# Patient Record
Sex: Male | Born: 1940 | Race: White | Hispanic: No | Marital: Married | Smoking: Former smoker
Health system: Southern US, Community
[De-identification: ages and names within clinical notes are randomized; demographics above are authoritative.]

## PROBLEM LIST (undated history)

## (undated) DIAGNOSIS — C61 Malignant neoplasm of prostate: Secondary | ICD-10-CM

## (undated) DIAGNOSIS — N529 Male erectile dysfunction, unspecified: Secondary | ICD-10-CM

## (undated) DIAGNOSIS — N433 Hydrocele, unspecified: Secondary | ICD-10-CM

## (undated) DIAGNOSIS — M5136 Other intervertebral disc degeneration, lumbar region: Secondary | ICD-10-CM

## (undated) DIAGNOSIS — Z87438 Personal history of other diseases of male genital organs: Secondary | ICD-10-CM

## (undated) DIAGNOSIS — M51369 Other intervertebral disc degeneration, lumbar region without mention of lumbar back pain or lower extremity pain: Secondary | ICD-10-CM

## (undated) DIAGNOSIS — M5126 Other intervertebral disc displacement, lumbar region: Secondary | ICD-10-CM

## (undated) HISTORY — PX: KNEE ARTHROSCOPY: SUR90

## (undated) HISTORY — DX: Other intervertebral disc degeneration, lumbar region: M51.36

## (undated) HISTORY — DX: Malignant neoplasm of prostate: C61

## (undated) HISTORY — DX: Other intervertebral disc degeneration, lumbar region without mention of lumbar back pain or lower extremity pain: M51.369

## (undated) HISTORY — PX: CATARACT EXTRACTION W/ INTRAOCULAR LENS  IMPLANT, BILATERAL: SHX1307

---

## 1998-12-14 HISTORY — PX: APPENDECTOMY: SHX54

## 1998-12-22 ENCOUNTER — Encounter: Payer: Self-pay | Admitting: Surgery

## 1998-12-22 ENCOUNTER — Inpatient Hospital Stay (HOSPITAL_COMMUNITY): Admission: EM | Admit: 1998-12-22 | Discharge: 1998-12-25 | Payer: Self-pay | Admitting: Surgery

## 1999-07-01 ENCOUNTER — Ambulatory Visit (HOSPITAL_COMMUNITY): Admission: RE | Admit: 1999-07-01 | Discharge: 1999-07-01 | Payer: Self-pay | Admitting: Gastroenterology

## 2004-03-13 ENCOUNTER — Encounter (INDEPENDENT_AMBULATORY_CARE_PROVIDER_SITE_OTHER): Payer: Self-pay | Admitting: Internal Medicine

## 2004-03-13 LAB — CONVERTED CEMR LAB: PSA: 1.2 ng/mL

## 2004-09-26 ENCOUNTER — Ambulatory Visit: Payer: Self-pay | Admitting: Family Medicine

## 2005-02-20 ENCOUNTER — Ambulatory Visit: Payer: Self-pay | Admitting: Family Medicine

## 2005-04-13 ENCOUNTER — Encounter (INDEPENDENT_AMBULATORY_CARE_PROVIDER_SITE_OTHER): Payer: Self-pay | Admitting: Internal Medicine

## 2005-04-13 LAB — CONVERTED CEMR LAB: PSA: 1.44 ng/mL

## 2005-04-18 ENCOUNTER — Ambulatory Visit: Payer: Self-pay | Admitting: Family Medicine

## 2005-05-02 LAB — FECAL OCCULT BLOOD, GUAIAC: Fecal Occult Blood: NEGATIVE

## 2005-05-04 ENCOUNTER — Ambulatory Visit: Payer: Self-pay | Admitting: Family Medicine

## 2005-11-13 DIAGNOSIS — M5126 Other intervertebral disc displacement, lumbar region: Secondary | ICD-10-CM

## 2005-11-13 HISTORY — DX: Other intervertebral disc displacement, lumbar region: M51.26

## 2006-01-31 ENCOUNTER — Ambulatory Visit: Payer: Self-pay | Admitting: Family Medicine

## 2006-03-19 ENCOUNTER — Ambulatory Visit: Payer: Self-pay | Admitting: Family Medicine

## 2006-06-12 ENCOUNTER — Ambulatory Visit: Payer: Self-pay | Admitting: Family Medicine

## 2006-06-18 ENCOUNTER — Encounter: Admission: RE | Admit: 2006-06-18 | Discharge: 2006-06-18 | Payer: Self-pay | Admitting: Orthopedic Surgery

## 2006-06-27 ENCOUNTER — Encounter (INDEPENDENT_AMBULATORY_CARE_PROVIDER_SITE_OTHER): Payer: Self-pay | Admitting: Internal Medicine

## 2006-06-27 ENCOUNTER — Ambulatory Visit: Payer: Self-pay | Admitting: Family Medicine

## 2006-06-27 LAB — CONVERTED CEMR LAB: PSA: 2.11 ng/mL

## 2006-07-28 ENCOUNTER — Ambulatory Visit: Payer: Self-pay | Admitting: Family Medicine

## 2006-08-09 ENCOUNTER — Ambulatory Visit: Payer: Self-pay | Admitting: Family Medicine

## 2007-01-30 ENCOUNTER — Ambulatory Visit: Payer: Self-pay | Admitting: Family Medicine

## 2007-04-18 ENCOUNTER — Telehealth (INDEPENDENT_AMBULATORY_CARE_PROVIDER_SITE_OTHER): Payer: Self-pay | Admitting: *Deleted

## 2007-07-24 ENCOUNTER — Encounter (INDEPENDENT_AMBULATORY_CARE_PROVIDER_SITE_OTHER): Payer: Self-pay | Admitting: Internal Medicine

## 2007-07-24 DIAGNOSIS — R030 Elevated blood-pressure reading, without diagnosis of hypertension: Secondary | ICD-10-CM | POA: Insufficient documentation

## 2007-07-24 DIAGNOSIS — J309 Allergic rhinitis, unspecified: Secondary | ICD-10-CM | POA: Insufficient documentation

## 2007-07-24 DIAGNOSIS — J329 Chronic sinusitis, unspecified: Secondary | ICD-10-CM | POA: Insufficient documentation

## 2007-07-24 DIAGNOSIS — M5137 Other intervertebral disc degeneration, lumbosacral region: Secondary | ICD-10-CM | POA: Insufficient documentation

## 2007-07-24 DIAGNOSIS — R7989 Other specified abnormal findings of blood chemistry: Secondary | ICD-10-CM | POA: Insufficient documentation

## 2007-07-24 DIAGNOSIS — M109 Gout, unspecified: Secondary | ICD-10-CM | POA: Insufficient documentation

## 2007-08-08 ENCOUNTER — Ambulatory Visit: Payer: Self-pay | Admitting: Family Medicine

## 2007-08-08 DIAGNOSIS — H6120 Impacted cerumen, unspecified ear: Secondary | ICD-10-CM | POA: Insufficient documentation

## 2007-09-04 LAB — CONVERTED CEMR LAB
BUN: 18 mg/dL (ref 6–23)
CO2: 28 meq/L (ref 19–32)
Calcium: 9 mg/dL (ref 8.4–10.5)
Chloride: 110 meq/L (ref 96–112)
Cholesterol: 189 mg/dL (ref 0–200)
Creatinine, Ser: 1.2 mg/dL (ref 0.4–1.5)
Folate: 14.9 ng/mL
GFR calc Af Amer: 78 mL/min
GFR calc non Af Amer: 65 mL/min
Glucose, Bld: 89 mg/dL (ref 70–99)
HDL: 34.6 mg/dL — ABNORMAL LOW (ref >39.0)
LDL Cholesterol: 122 mg/dL — ABNORMAL HIGH (ref 0–99)
Mono Screen: NEGATIVE
PSA: 2.26 ng/mL (ref 0.10–4.00)
Potassium: 4.4 meq/L (ref 3.5–5.1)
Sodium: 144 meq/L (ref 135–145)
Total CHOL/HDL Ratio: 5.5
Triglycerides: 163 mg/dL — ABNORMAL HIGH (ref 0–149)
VLDL: 33 mg/dL (ref 0–40)
Vitamin B-12: 408 pg/mL (ref 211–911)

## 2008-11-11 ENCOUNTER — Encounter (INDEPENDENT_AMBULATORY_CARE_PROVIDER_SITE_OTHER): Payer: Self-pay | Admitting: Internal Medicine

## 2008-11-11 ENCOUNTER — Ambulatory Visit: Payer: Self-pay | Admitting: Family Medicine

## 2008-11-25 ENCOUNTER — Ambulatory Visit: Payer: Self-pay | Admitting: Internal Medicine

## 2008-11-27 LAB — CONVERTED CEMR LAB
ALT: 24 units/L (ref 0–53)
AST: 24 units/L (ref 0–37)
Albumin: 3.8 g/dL (ref 3.5–5.2)
Alkaline Phosphatase: 54 units/L (ref 39–117)
BUN: 22 mg/dL (ref 6–23)
Basophils Absolute: 0 10*3/uL (ref 0.0–0.1)
Basophils Relative: 0.3 % (ref 0.0–3.0)
Bilirubin, Direct: 0.1 mg/dL (ref 0.0–0.3)
CO2: 27 meq/L (ref 19–32)
Calcium: 9.2 mg/dL (ref 8.4–10.5)
Chloride: 109 meq/L (ref 96–112)
Cholesterol: 175 mg/dL (ref 0–200)
Creatinine, Ser: 1.2 mg/dL (ref 0.4–1.5)
Eosinophils Absolute: 0.1 10*3/uL (ref 0.0–0.7)
Eosinophils Relative: 1.8 % (ref 0.0–5.0)
GFR calc Af Amer: 78 mL/min
GFR calc non Af Amer: 65 mL/min
Glucose, Bld: 87 mg/dL (ref 70–99)
HCT: 47.5 % (ref 39.0–52.0)
HDL: 32.1 mg/dL — ABNORMAL LOW (ref >39.0)
Hemoglobin: 16.3 g/dL (ref 13.0–17.0)
LDL Cholesterol: 106 mg/dL — ABNORMAL HIGH (ref 0–99)
Lymphocytes Relative: 33.7 % (ref 12.0–46.0)
MCHC: 34.3 g/dL (ref 30.0–36.0)
MCV: 94.9 fL (ref 78.0–100.0)
Monocytes Absolute: 0.6 10*3/uL (ref 0.1–1.0)
Monocytes Relative: 9.5 % (ref 3.0–12.0)
Neutro Abs: 3.1 10*3/uL (ref 1.4–7.7)
Neutrophils Relative %: 54.7 % (ref 43.0–77.0)
PSA: 2.73 ng/mL (ref 0.10–4.00)
Platelets: 172 10*3/uL (ref 150–400)
Potassium: 4.4 meq/L (ref 3.5–5.1)
RBC: 5.01 M/uL (ref 4.22–5.81)
RDW: 12.5 % (ref 11.5–14.6)
Sodium: 141 meq/L (ref 135–145)
TSH: 2.06 microintl units/mL (ref 0.35–5.50)
Total Bilirubin: 0.9 mg/dL (ref 0.3–1.2)
Total CHOL/HDL Ratio: 5.5
Total Protein: 6.7 g/dL (ref 6.0–8.3)
Triglycerides: 182 mg/dL — ABNORMAL HIGH (ref 0–149)
Uric Acid, Serum: 5.9 mg/dL (ref 4.0–7.8)
VLDL: 36 mg/dL (ref 0–40)
WBC: 5.8 10*3/uL (ref 4.5–10.5)

## 2009-01-05 ENCOUNTER — Telehealth (INDEPENDENT_AMBULATORY_CARE_PROVIDER_SITE_OTHER): Payer: Self-pay | Admitting: *Deleted

## 2009-01-12 ENCOUNTER — Encounter (INDEPENDENT_AMBULATORY_CARE_PROVIDER_SITE_OTHER): Payer: Self-pay | Admitting: Internal Medicine

## 2009-03-04 ENCOUNTER — Ambulatory Visit: Payer: Self-pay | Admitting: Gastroenterology

## 2009-03-16 ENCOUNTER — Ambulatory Visit: Payer: Self-pay | Admitting: Gastroenterology

## 2009-03-16 LAB — HM COLONOSCOPY

## 2009-09-13 ENCOUNTER — Encounter (INDEPENDENT_AMBULATORY_CARE_PROVIDER_SITE_OTHER): Payer: Self-pay | Admitting: Internal Medicine

## 2009-09-15 ENCOUNTER — Telehealth (INDEPENDENT_AMBULATORY_CARE_PROVIDER_SITE_OTHER): Payer: Self-pay | Admitting: Internal Medicine

## 2010-05-27 ENCOUNTER — Telehealth: Payer: Self-pay | Admitting: Family Medicine

## 2010-06-14 ENCOUNTER — Ambulatory Visit: Payer: Self-pay | Admitting: Family Medicine

## 2010-06-14 DIAGNOSIS — E785 Hyperlipidemia, unspecified: Secondary | ICD-10-CM | POA: Insufficient documentation

## 2010-06-15 ENCOUNTER — Encounter: Payer: Self-pay | Admitting: Family Medicine

## 2010-06-15 LAB — CONVERTED CEMR LAB
ALT: 23 units/L (ref 0–53)
AST: 26 units/L (ref 0–37)
Albumin: 4 g/dL (ref 3.5–5.2)
Alkaline Phosphatase: 55 units/L (ref 39–117)
BUN: 22 mg/dL (ref 6–23)
Bilirubin, Direct: 0.1 mg/dL (ref 0.0–0.3)
CO2: 31 meq/L (ref 19–32)
Calcium: 9.4 mg/dL (ref 8.4–10.5)
Chloride: 109 meq/L (ref 96–112)
Cholesterol: 188 mg/dL (ref 0–200)
Creatinine, Ser: 1.3 mg/dL (ref 0.4–1.5)
Direct LDL: 106.2 mg/dL
GFR calc non Af Amer: 56.99 mL/min (ref >60)
Glucose, Bld: 86 mg/dL (ref 70–99)
HDL: 34.4 mg/dL — ABNORMAL LOW (ref >39.00)
PSA: 3.18 ng/mL (ref 0.10–4.00)
Potassium: 4.2 meq/L (ref 3.5–5.1)
Sodium: 145 meq/L (ref 135–145)
Total Bilirubin: 0.8 mg/dL (ref 0.3–1.2)
Total CHOL/HDL Ratio: 5
Total Protein: 6.5 g/dL (ref 6.0–8.3)
Triglycerides: 332 mg/dL — ABNORMAL HIGH (ref 0.0–149.0)
Uric Acid, Serum: 5.8 mg/dL (ref 4.0–7.8)
VLDL: 66.4 mg/dL — ABNORMAL HIGH (ref 0.0–40.0)

## 2010-06-23 ENCOUNTER — Telehealth (INDEPENDENT_AMBULATORY_CARE_PROVIDER_SITE_OTHER): Payer: Self-pay | Admitting: *Deleted

## 2010-06-30 ENCOUNTER — Ambulatory Visit: Payer: Self-pay | Admitting: Internal Medicine

## 2010-06-30 LAB — CONVERTED CEMR LAB: Triglycerides: 181 mg/dL — ABNORMAL HIGH (ref 0.0–149.0)

## 2010-08-16 ENCOUNTER — Encounter (INDEPENDENT_AMBULATORY_CARE_PROVIDER_SITE_OTHER): Payer: Self-pay | Admitting: *Deleted

## 2010-12-13 NOTE — Letter (Signed)
Summary: Lipid Letter  [place] [facility]  [address] [place], [place] [address]   Phone: [phone]  Fax: [phone]    06/15/2010  Gerik Coberly [address] [place], [place]  [address]  Dear Mr. Pullin,  We have carefully reviewed your last blood work and lipid profile from 11/25/2008 and the results are noted below with a summary of recommendations for lipid management.    Cholesterol:       188     Goal: < 200   HDL "good" Cholesterol:   73.22     Goal: > 40   LDL "bad" Cholesterol:   106     Goal: < 130   Triglycerides:       332.0     Goal: < 150    You triglycerides came back higher than in the past.  I would encourage you to watch you fatty food intake.  Also, continue your fish oil.  If the triglycerides remain elevated, we may discuss adding another medication to help control your triglycerides.  Your bad cholesterol level was good.  Your sugar, liver and gout check came back all normal.  Your prostate level came back a bit higher than last year, but still in normal range for your age.  I'd recommend repeating in 1 year. Your kidney function was a bit borderline. For this I'd just recommend you stay well hydrated and not use too many anti inflammatories like alleve, ibuprofen, advil or motrin (I'd prefer tylenol).  I would recommend going down to 1/2 a pill of your allopurinol daily as well.    TLC Diet (Therapeutic Lifestyle Change): Saturated Fats & Transfatty acids should be kept < 7% of total calories ***Reduce Saturated Fats Polyunstaurated Fat can be up to 10% of total calories Monounsaturated Fat Fat can be up to 20% of total calories Carbohydrates should be 50-60% of total calories Protein should be approximately 15% of total calories Fiber should be at least 20-30 grams a day ***Increased fiber may help lower LDL Total Cholesterol should be < 200mg /day Consider adding plant stanol/sterols to diet (example: Benacol spread) ***A higher intake  of unsaturated fat may reduce Triglycerides and Increase HDL    Adjunctive Measures (may lower LIPIDS and reduce risk of Heart Attack) include: Aerobic Exercise (20-30 minutes 3-4 times a week) Limit Alcohol Consumption Weight Reduction     Current Medications: 1)    Allopurinol 300 Mg Tabs (Allopurinol) .... Take 1/2 by mouth once a day 2)    Saw Palmetto  .... Take 2 by mouth once a day 3)    Multivitamins   Tabs (Multiple vitamin) .... Take one by mouth once a day 4)    Glucosamine  .... Take one by mouth two times a day 5)    Fish Oil 1200 Mg  Caps (Omega-3 fatty acids) .... Take one by mouth two times a day  If you have any questions, please call. We appreciate being able to work with you.   Sincerely,    [place] [facility] Eustaquio Boyden  MD  Appended Document: Lipid Letter Letter mailed

## 2010-12-13 NOTE — Miscellaneous (Signed)
Summary: flu vaccine at [facility]   Clinical Lists Changes  Observations: Added new observation of FLU VAX: Historical (08/16/2010 16:33)      Immunization History:  Influenza Immunization History:    Influenza:  historical (08/16/2010)  Pt received flu vaccine at [facility] [address] in [place].          Lowella Petties CMA  August 16, 2010 4:34 PM

## 2010-12-13 NOTE — Progress Notes (Signed)
Summary: pt wants to repeat lab work  Phone Note Call from Patient Call back at [facility] [phone]   Caller: Patient Call For: Joshua Boyden  MD Summary of Call: Pt came in the first of the month and had lab work done.  He was not fasting for this and his triglycerides were very high.  He is asking if he can have the labs repeated, after he has fasted.  Please advise. Initial call taken by: Lowella Petties CMA,  June 23, 2010 11:15 AM  Follow-up for Phone Call        yes that is fine.  have him come in fasting and we can check just triglycerides. Follow-up by: Joshua Boyden  MD,  June 23, 2010 11:22 AM  Additional Follow-up for Phone Call Additional follow up Details #1::        Left message on machine for pt to call to schedule lab appt.              Lowella Petties CMA  June 23, 2010 11:54 AM   pt scheduled appt for 06/30/10 Liane Comber CMA (AAMA)  June 29, 2010 1:26 PM

## 2010-12-13 NOTE — Assessment & Plan Note (Signed)
Summary: CPX/MED REFILL/[facility] [name]   Vital Signs:  Joshua Dominguez profile:   70 year old male Height:      77.0 inches Weight:      264 pounds BMI:     31.42 Temp:     97.7 degrees F oral Pulse rate:   60 / minute Pulse rhythm:   regular BP sitting:   118 / 80  (left arm) Cuff size:   large  Vitals Entered By: Selena Batten Dance CMA Duncan Dull) (June 14, 2010 11:24 AM) CC: Cpx   History of Present Illness: CC: presents for CPX, establish  Due for pneumovax.  presents today for CPE.    Does have left foot pain, chronic.  dx with plantar fasciitis, however endorses burning pain along arch and dorsal surface of foot improved by running, worse at night.  No pain when first gets up.  Has been to PT, has tried acupuncture, has tried NSAIDs, ice/heat, heel cushion, orthotics, and metatarsal pad for pain with only limited improvement.  Has seen podiatrists in past.  To see Dr. Fonnie Jarvis (ortho) tomorrow, thinking may need surgery (nerve ablation surgery?).  Runs 3d/wk, lifts weights, tries to stay healthy with diet.  Does not really watch saturated fats or salt intake.  Current Medications (verified): 1)  Allopurinol 300 Mg Tabs (Allopurinol) .... Take One By Mouth Once A Day 2)  Saw Palmetto .... Take 2 By Mouth Once A Day 3)  Multivitamins   Tabs (Multiple Vitamin) .... Take One By Mouth Once A Day 4)  Glucosamine .... Take One By Mouth Two Times A Day 5)  Fish Oil 1200 Mg  Caps (Omega-3 Fatty Acids) .... Take One By Mouth Two Times A Day  Allergies (verified): 1)  ! 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 [place], [place]  Review of Systems  The Joshua Dominguez denies anorexia, fever, weight loss, weight gain, vision loss, chest pain, syncope, dyspnea on exertion, peripheral edema, prolonged cough, hemoptysis, abdominal pain, melena, hematochezia, severe indigestion/heartburn, and hematuria.         + h/o hemorroids  Physical Exam  General:  alert, well-developed, well-nourished, and well-hydrated.   Head:  Normocephalic and atraumatic without obvious abnormalities. No apparent alopecia or balding. Eyes:  No corneal or conjunctival inflammation noted. EOMI. Perrla. Ears:  External ear exam shows no significant lesions or deformities.  Otoscopic examination reveals clear canals, tympanic membranes are intact bilaterally without bulging, retraction, inflammation or discharge. Hearing is grossly normal bilaterally. Mouth:  Oral mucosa and oropharynx without lesions or exudates.  Teeth in good repair. Neck:  No deformities, masses, or tenderness noted. Lungs:  Normal respiratory effort, chest expands symmetrically. Lungs are clear to auscultation, no crackles or wheezes. Heart:  Normal rate and regular rhythm. S1 and S2 normal without gallop, murmur, click, rub or other extra sounds. Abdomen:  Bowel sounds positive,abdomen soft and non-tender without masses, organomegaly or hernias noted. Rectal:  No external abnormalities noted. Normal sphincter tone. No rectal masses or tenderness.  +  small ext hemorrhoids Prostate:  Prostate gland firm and smooth, no enlargement, nodularity, tenderness, mass, asymmetry or induration. Msk:  L foot - no deformity, no erythema, no edema.  + pain with palpation at metatarsal heads of 2nd and 3rd toes, no pain with palpation of heel or  plantar surface.  No longitudinal or transverse arch collapse. Pulses:  2+ periph pulses Extremities:  No clubbing, cyanosis, edema, or deformity noted with normal full range of motion of all joints.     Impression & Recommendations:  Problem # 1:  SPECIAL SCREENING MALIGNANT NEOPLASM OF PROSTATE (ICD-V76.44) DRE reassuring.  check PSA.  if elevated, will need rpt (as today had DRE prior). Orders: TLB-PSA (Prostate Specific Antigen) (84153-PSA)  Problem # 2:  WELL ADULT EXAM (ICD-V70.0) healthy 69yo, discussed lifestyle, diet.  last pneumovax today.  Problem # 3:  DYSLIPIDEMIA (ICD-272.4) recheck FLP.  mild hypertriglyceridemia, low HDL. Orders: TLB-Lipid Panel (80061-LIPID) TLB-Hepatic/Liver Function Pnl (80076-HEPATIC) TLB-BMP (Basic Metabolic Panel-BMET) (80048-METABOL)  Labs Reviewed: SGOT: 24 (11/25/2008)   SGPT: 24 (11/25/2008)   HDL:32.1 (11/25/2008), 34.6 (08/08/2007)  LDL:106 (11/25/2008), 122 (08/08/2007)  Chol:175 (11/25/2008), 189 (08/08/2007)  Trig:182 (11/25/2008), 163 (08/08/2007)  Problem # 4:  GOUT (ICD-274.9)  continue meds.  cehck uric acid level.  allopurinol seems to be controlling well.  stays away from organ meats, but does drink wine daily.   His updated medication list for this problem includes:    Allopurinol 300 Mg Tabs (Allopurinol) .Marland Kitchen... Take one by mouth once a day  Orders: TLB-Uric Acid, Blood (84550-URIC)  His updated medication list for this problem includes:    Allopurinol 300 Mg Tabs (Allopurinol) .Marland Kitchen... Take one by mouth once a day  Complete Medication List: 1)  Allopurinol 300 Mg Tabs (Allopurinol) .... Take one by mouth once a day 2)  Saw Palmetto  .... Take 2 by mouth once a day 3)  Multivitamins Tabs (Multiple vitamin) .... Take one by mouth once a day 4)  Glucosamine  .... Take one by mouth two times a day 5)  Fish Oil 1200 Mg Caps (Omega-3 fatty acids) .... Take one by mouth two times a day  Other Orders: Pneumococcal Vaccine  (84132) Admin 1st Vaccine (44010)  Joshua Dominguez Instructions: 1)  Good to meet you today! 2)  Watch saturated fats. 3)  Phone tree for results, unless abnormal then we will call you. 4)  Please return in 1 year or as needed. Prescriptions: ALLOPURINOL 300 MG TABS (ALLOPURINOL) Take one by mouth once a day  #34 Tablet x 11   Entered and Authorized by:   Eustaquio Boyden  MD   Signed by:   Eustaquio Boyden  MD on 06/14/2010   Method used:   Electronically to        [facility]. [address] [phone]* (retail)       [address] [place], [place]  [id number]       Ph: [phone]       Fax: [phone]   RxID:   [id number]   Current Allergies (reviewed today): ! AFRIN NASAL SPRAY (OXYMETAZOLINE HCL)   Prevention & Chronic Care Immunizations   Influenza vaccine: Not documented   Influenza vaccine due: 07/14/2010    Tetanus booster: 04/18/2005: Td   Tetanus booster due: 04/19/2015    Pneumococcal vaccine: Pneumovax  (06/14/2010)    H. zoster vaccine: Not documented  Colorectal Screening   Hemoccult: Negative  (05/02/2005)   Hemoccult action/deferral: Not indicated  (06/14/2010)  Colonoscopy: Location:  [facility].    (03/16/2009)   Colonoscopy due: 03/2019  Other Screening   PSA: 2.73  (11/25/2008)   PSA ordered.   Smoking status: quit  (07/24/2007)  Lipids   Total Cholesterol: 175  (11/25/2008)   LDL: 106  (11/25/2008)   LDL Direct: Not documented   HDL: 32.1  (11/25/2008)   Triglycerides: 182  (11/25/2008)    SGOT (AST): 24  (11/25/2008)   SGPT (ALT): 24  (11/25/2008)   Alkaline phosphatase: 54  (11/25/2008)   Total bilirubin: 0.9  (11/25/2008)    Lipid flowsheet reviewed?: Yes   Progress toward LDL goal: At goal  Self-Management Support :   Personal Goals (by the next clinic visit) :      Personal LDL goal: 130  (06/14/2010)    Lipid self-management support: Not documented    Nursing Instructions: Give Pneumovax  today    Immunizations Administered:  Pneumonia Vaccine:    Vaccine Type: Pneumovax    Site: left deltoid    Mfr: Merck    Dose: 0.5 ml    Route: IM    Given by: Selena Batten Dance CMA (AAMA)    Exp. Date: 11/30/2011    Lot #: [id number]    VIS given: 06/10/96 version given June 14, 2010.

## 2010-12-13 NOTE — Progress Notes (Signed)
Summary: Allopurinol  Phone Note Refill Request Message from:  [employer] on May 27, 2010 2:42 PM  Refills Requested: Medication #1:  ALLOPURINOL 300 MG TABS Take one by mouth once a day [employer] [address] [place] #[id number]*   Last Lenox Ahr Date:  03/30/2010   Pharmacy Phone:  [phone]   Method Requested: Electronic Initial call taken by: Delilah Shan CMA Joshua Dominguez),  May 27, 2010 2:42 PM  Follow-up for Phone Call        It appears the patient is >1 year from last OV.  Fill rx for 1 month, same sig as before, but have patient scheduled for OV.  Follow-up by: Crawford Givens MD,  May 27, 2010 2:56 PM  Additional Follow-up for Phone Call Additional follow up Details #1::        Patient Advised.  Additional Follow-up by: Delilah Shan CMA (AAMA),  May 27, 2010 3:06 PM    Prescriptions: ALLOPURINOL 300 MG TABS (ALLOPURINOL) Take one by mouth once a day  #34 Tablet x 0   Entered by:   Delilah Shan CMA (AAMA)   Authorized by:   Crawford Givens MD   Signed by:   Delilah Shan CMA (AAMA) on 05/27/2010   Method used:   Electronically to        [facility]. [address] [phone]* (retail)       [address] [place], [place]  [address]       Ph: [phone]       Fax: [phone]   RxID:   [id number]

## 2011-01-09 ENCOUNTER — Other Ambulatory Visit: Payer: Self-pay | Admitting: Family Medicine

## 2011-01-09 ENCOUNTER — Ambulatory Visit (INDEPENDENT_AMBULATORY_CARE_PROVIDER_SITE_OTHER): Payer: BC Managed Care – PPO | Admitting: Family Medicine

## 2011-01-09 ENCOUNTER — Encounter: Payer: Self-pay | Admitting: Family Medicine

## 2011-01-09 DIAGNOSIS — H6120 Impacted cerumen, unspecified ear: Secondary | ICD-10-CM

## 2011-01-09 DIAGNOSIS — M109 Gout, unspecified: Secondary | ICD-10-CM

## 2011-01-10 LAB — BASIC METABOLIC PANEL
BUN: 26 mg/dL — ABNORMAL HIGH (ref 6–23)
CO2: 27 mEq/L (ref 19–32)
Chloride: 107 mEq/L (ref 96–112)
Creatinine, Ser: 1.2 mg/dL (ref 0.4–1.5)
Potassium: 4.1 mEq/L (ref 3.5–5.1)

## 2011-01-10 LAB — URIC ACID: Uric Acid, Serum: 5.5 mg/dL (ref 4.0–7.8)

## 2011-01-19 NOTE — Assessment & Plan Note (Signed)
Summary: EARS FEEL CLOGGED   Vital Signs:  Patient profile:   70 year old male Weight:      264.25 pounds Temp:     98.6 degrees F oral Pulse rate:   74 / minute Pulse rhythm:   regular BP sitting:   114 / 70  (left arm) Cuff size:   large  Vitals Entered By: Selena Batten Dance CMA Duncan Dull) (January 09, 2011 3:06 PM) CC: Ears feel clogged   History of Present Illness: CC: ear clogged  1. L ear for last month with some irritation, thinks impacted.  Has tried at home with warm water, hydrogen peroxide.  unable to resolve it.  + L ear tinnitus chronic, but no hearing changes.  No fevers/chills.  + congestion.  h/o impactions in past.  2. gout - has had 3 separate gout episodes in last 30 days.  was on allopurinol 300mg , doesn't think covering it.  Recent attack R wrist as well as 3rd/4th toes on R foot.  Toe pain not typical of gout - worse at night, has been happening every night.  no problems during day.  ice makes it better.  sheet touching toes causes pain.  thinks it's gout.  carries colchicine with him.  wonders what is causing increase.  No increase in organ meats.  Did have more EtOH prior to last flare (wrist).  going to [place] soon, would like to be gout free there.  Current Medications (verified): 1)  Allopurinol 300 Mg Tabs (Allopurinol) .... Take 1/2 By Mouth Once A Day 2)  Saw Palmetto .... Take 2 By Mouth Once A Day 3)  Multivitamins   Tabs (Multiple Vitamin) .... Take One By Mouth Once A Day 4)  Glucosamine .... Take One By Mouth Two Times A Day 5)  Fish Oil 1200 Mg  Caps (Omega-3 Fatty Acids) .... Take One By Mouth Two Times A Day  Allergies: 1)  ! 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 [place], [place]  Review of Systems       per HPI  Physical Exam  General:  alert, well-developed, well-nourished, and well-hydrated.   Head:  Normocephalic and atraumatic without obvious abnormalities. No apparent alopecia or balding.  Eyes:  No corneal or conjunctival inflammation noted. EOMI. Perrla. Ears:  cerumen impaction blaterally, small piece of TMs seen bilaterally, pearly grey. Nose:  nares clear bilaterally Mouth:  Oral mucosa and oropharynx without lesions or exudates.  Teeth in good repair.  MMM Neck:  No deformities, masses, or tenderness noted. Msk:  R foot - no deformity.  nontender throughout, no pain or erythema at 3rd/4th toes.  some collapse of longitudinal arch. Pulses:  2+ DP/PT on R Extremities:  no pedal edema   Impression & Recommendations:  Problem # 1:  CERUMEN IMPACTION, BILATERAL (ICD-380.4) disimpaction performed.  good result.    Orders: Cerumen Impaction Removal (16109)  Problem # 2:  GOUT (ICD-274.9) recheck UA level as well as Cr to ensure would tolerate 450mg  allopurinol daily.  increase, rec use colchicine as needed.  unsure if toe pain due to gout but wrist flare consistent  with same.  His updated medication list for this problem includes:    Allopurinol 300 Mg Tabs (Allopurinol) .Marland Kitchen... Take 1 1/2 by mouth once a day  Orders: TLB-BMP (Basic Metabolic Panel-BMET) (80048-METABOL) TLB-Uric Acid, Blood (84550-URIC) Venipuncture (16109)  Complete Medication List: 1)  Allopurinol 300 Mg Tabs (Allopurinol) .... Take 1 1/2 by mouth once a day 2)  Saw Palmetto  .... Take 2 by mouth once a day 3)  Multivitamins Tabs (Multiple vitamin) .... Take one by mouth once a day 4)  Glucosamine  .... Take one by mouth two times a day 5)  Fish Oil 1200 Mg Caps (Omega-3 fatty acids)  .... Take one by mouth two times a day  Patient Instructions: 1)  Increase allopurinol to 450mg  daily (1 1/2 pills daily). 2)  blood work today to check uric acid as well as kidneys. 3)  update me with how new dose is doing. 4)  Ears irrigated today. Prescriptions: ALLOPURINOL 300 MG TABS (ALLOPURINOL) Take 1 1/2 by mouth once a day  #45 x 3   Entered and Authorized by:   Eustaquio Boyden  MD   Signed by:   Eustaquio Boyden  MD on 01/09/2011   Method used:   Electronically to        [facility]. [address] [phone]* (retail)       [address] [place], [place]  [id number]       Ph: [phone]       Fax: [phone]   RxID:   [id number]    Orders Added: 1)  Cerumen Impaction Removal [36644] 2)  Est. Patient Level IV [03474] 3)  TLB-BMP (Basic Metabolic Panel-BMET) [80048-METABOL] 4)  TLB-Uric Acid, Blood [84550-URIC] 5)  Venipuncture [25956]    Current Allergies (reviewed today): ! AFRIN NASAL SPRAY (OXYMETAZOLINE HCL)

## 2011-03-22 ENCOUNTER — Telehealth: Payer: Self-pay | Admitting: *Deleted

## 2011-03-22 NOTE — Telephone Encounter (Signed)
Patient is requesting rx for shingles vaccine to be sent to [facility] [address]

## 2011-03-23 MED ORDER — ZOSTER VACCINE LIVE 19400 UNT/0.65ML ~~LOC~~ SOLR: 0.6500 mL | Freq: Once | SUBCUTANEOUS | Status: AC

## 2011-03-23 NOTE — Telephone Encounter (Signed)
Please notify sent in.  To notify us when given so we can input into chart.

## 2011-03-23 NOTE — Telephone Encounter (Signed)
Patient notified and will update [place] when he has the vaccine.

## 2011-03-30 ENCOUNTER — Other Ambulatory Visit: Payer: Self-pay | Admitting: *Deleted

## 2011-03-30 NOTE — Telephone Encounter (Signed)
Opened in error

## 2011-04-27 ENCOUNTER — Encounter: Payer: Self-pay | Admitting: Family Medicine

## 2011-04-28 ENCOUNTER — Ambulatory Visit (INDEPENDENT_AMBULATORY_CARE_PROVIDER_SITE_OTHER): Payer: BC Managed Care – PPO | Admitting: Family Medicine

## 2011-04-28 ENCOUNTER — Encounter: Payer: Self-pay | Admitting: Family Medicine

## 2011-04-28 VITALS — BP 150/90 | HR 70 | Temp 98.5°F | Ht 77.0 in | Wt 265.0 lb

## 2011-04-28 DIAGNOSIS — M79672 Pain in left foot: Secondary | ICD-10-CM

## 2011-04-28 DIAGNOSIS — M542 Cervicalgia: Secondary | ICD-10-CM | POA: Insufficient documentation

## 2011-04-28 DIAGNOSIS — M79609 Pain in unspecified limb: Secondary | ICD-10-CM

## 2011-04-28 MED ORDER — CYCLOBENZAPRINE HCL 10 MG PO TABS: 10.0000 mg | ORAL_TABLET | Freq: Three times a day (TID) | ORAL | Status: AC | PRN

## 2011-04-28 NOTE — Assessment & Plan Note (Signed)
Pt has had 3 different opinions by podiatry and ortho, all think pain consistent with plantar fasciitis, recommend against surgery. Pain has been going on for >1 year.  Have not discussed steroid injections. Doubt we will get good answer. rec return to them, monitor for now with NSAIDs. I do believe neck and foot are 2 separate issues, no evidence of systemic or autoimmune inflammatory condition currently, discussed this with patient.  discussed things to watch out for (fever, chill, rashes, nausea, abd pain, polyarthritis, etc) and to notify us if happens.

## 2011-04-28 NOTE — Progress Notes (Signed)
  Subjective:    Patient ID: Joshua Dominguez, male    DOB: 08/30/1941, 70 y.o.   MRN: [id number]  HPI CC: neck pain  1. 26mo h/o neck pain.  (Started 11/2010).  Feels lots of cracking and popping.  Painful to turn head to right.  R lateral neck, feels it's in joint.  Denies shooting pain down arms, numbness or weakness in arms.  No radiation of pain.  Went to chiropractor - s/p manipulations and not improved.  Denies inciting trauma/injury to start sxs.  Has tried antiinflammatories.  Improved with NSAIDs, then pain returns.  Better with wet heat.  2. Heel pain - dx plantar fasciitis, pt doesn't think this is issue, no pain with first steps in am, worse at night, better with walking, and no heel pain.  Pain described in left arch, sometimes top of foot.  Ice water baths, heel cushion doesn't help.  Heat helps.  Denies inciting injury.  Denies fracture to that foot, has had several ankle sprains in past.  Have done xrays of feet, has seen 2 different podiatrists.  H/o gout, last UA level 5.5 on 300mg  allopurinol daily.  No other joint pains, denies swelling or redness or warmth.  Denies fevers/chills, new rash, nausea/vomiting, abd pain, oral lesions.  Review of Systems Per HPI    Objective:   Physical Exam  Nursing note and vitals reviewed. Constitutional: He is oriented to person, place, and time. He appears well-developed and well-nourished. No distress.  HENT:  Head: Normocephalic and atraumatic.  Mouth/Throat: Oropharynx is clear and moist. No oropharyngeal exudate.  Eyes: Conjunctivae and EOM are normal. Pupils are equal, round, and reactive to light. No scleral icterus.  Musculoskeletal:       Cervical back: Normal. He exhibits normal range of motion.       Right foot: Normal.       Left foot: He exhibits tenderness. He exhibits normal range of motion, no bony tenderness and no swelling.       No midline spine tenderness. Neg spurling. + tender to palpation right mid trapezius as  well as R atlanto occipital joint. L foot - pain along left arch at sole  Neurological: He is alert and oriented to person, place, and time. He has normal strength. No sensory deficit. Coordination and gait normal.  Skin: Skin is warm and dry. No rash noted.          Assessment & Plan:

## 2011-04-28 NOTE — Assessment & Plan Note (Signed)
Exam consistent with likely trapezius muscle strain, ? Scapular dyskinesia as R scapula moves less than left on abduction at shoulders. Treat with muscle relaxant as well as NSAID for next few weeks, stretching exercises. If not better, refer to PT. Forgot to give neck strain handout for stretching exercises, will mail to patient.

## 2011-04-28 NOTE — Patient Instructions (Signed)
For neck - trial of muscle relaxant along with anti inflammatory. Stretching exercises for neck.  If not improving after a few weeks, let me know and we will set you up with physical therapy for neck. Call us with questions.

## 2011-05-16 ENCOUNTER — Telehealth: Payer: Self-pay | Admitting: *Deleted

## 2011-05-16 ENCOUNTER — Other Ambulatory Visit: Payer: Self-pay | Admitting: *Deleted

## 2011-05-16 MED ORDER — ZOSTER VACCINE LIVE 19400 UNT/0.65ML ~~LOC~~ SOLR: 0.6500 mL | Freq: Once | SUBCUTANEOUS | Status: DC

## 2011-05-16 NOTE — Telephone Encounter (Signed)
Opened in error

## 2011-05-16 NOTE — Telephone Encounter (Signed)
Patient called to see if he could have the zostavax canceled with [facility] [address] and recent to [facility] [address] [facility]. Rx sent to [facility].

## 2011-05-16 NOTE — Telephone Encounter (Signed)
Addended by: Melody Comas L on: 05/16/2011 11:35 AM   Modules accepted: Orders

## 2011-05-30 ENCOUNTER — Ambulatory Visit
Admission: RE | Admit: 2011-05-30 | Discharge: 2011-05-30 | Disposition: A | Discharge status: Home / Self Care | Payer: BC Managed Care – PPO | Source: Ambulatory Visit | Attending: Family Medicine | Admitting: Family Medicine

## 2011-05-30 ENCOUNTER — Other Ambulatory Visit: Payer: Self-pay | Admitting: Family Medicine

## 2011-05-30 DIAGNOSIS — M542 Cervicalgia: Secondary | ICD-10-CM

## 2011-09-06 ENCOUNTER — Other Ambulatory Visit: Payer: Self-pay | Admitting: Family Medicine

## 2011-11-10 ENCOUNTER — Other Ambulatory Visit: Payer: Self-pay | Admitting: Family Medicine

## 2011-11-10 DIAGNOSIS — R42 Dizziness and giddiness: Secondary | ICD-10-CM

## 2011-11-10 DIAGNOSIS — R51 Headache: Secondary | ICD-10-CM

## 2011-11-11 ENCOUNTER — Ambulatory Visit
Admission: RE | Admit: 2011-11-11 | Discharge: 2011-11-11 | Disposition: A | Discharge status: Home / Self Care | Payer: BC Managed Care – PPO | Source: Ambulatory Visit | Attending: Family Medicine | Admitting: Family Medicine

## 2011-11-11 DIAGNOSIS — R51 Headache: Secondary | ICD-10-CM

## 2011-11-11 DIAGNOSIS — R42 Dizziness and giddiness: Secondary | ICD-10-CM

## 2012-08-15 ENCOUNTER — Other Ambulatory Visit: Payer: Self-pay | Admitting: Family Medicine

## 2013-02-05 ENCOUNTER — Encounter: Payer: Self-pay | Admitting: Family Medicine

## 2013-02-05 DIAGNOSIS — C61 Malignant neoplasm of prostate: Secondary | ICD-10-CM | POA: Insufficient documentation

## 2013-03-02 ENCOUNTER — Other Ambulatory Visit: Payer: Self-pay | Admitting: Family Medicine

## 2013-03-03 ENCOUNTER — Telehealth: Payer: Self-pay | Admitting: Family Medicine

## 2013-03-03 MED ORDER — ALLOPURINOL 300 MG PO TABS: ORAL_TABLET | ORAL | Status: DC

## 2013-03-03 NOTE — Telephone Encounter (Signed)
ALLOPURINOL 300MG  TAKE 1 AND 1/2 TABLETS BY MOUTH ONCE DAILY AS DIRECTED LAST RF 01/10/2013 #45

## 2013-03-03 NOTE — Telephone Encounter (Signed)
Rx Refilled  

## 2013-03-17 NOTE — Progress Notes (Signed)
Radiation Oncology         [phone] ________________________________  Initial outpatient Consultation  Name: Joshua Dominguez MRN: [id number]  Date: 03/19/2013  DOB: December 08, 1940  JY:NWGNFA Sharen Hones, MD  Garnett Farm, MD   REFERRING PHYSICIAN: Garnett Farm, MD  DIAGNOSIS: 72 y.o. 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 gentleman.  He was noted to have an elevated PSA of 4.53 by his primary care physician, Dr. Tanya Nones.  Accordingly, he was referred for evaluation in urology by Dr. Vernie Ammons,  digital rectal examination was performed at that time revealing a palpable ridge along the right gland.  The patient proceeded to transrectal ultrasound with 12 biopsies of the prostate on 01/30/2013.  The prostate volume measured 36 cc.  Out of 12 core biopsies, 7 were positive.  The maximum Gleason score was 3+3, and this was seen in the areas displayed below.  The patient reviewed the biopsy results with his urologist and he has kindly been referred today for discussion of potential radiation treatment options.  The patient has a for chart he brother who is a urologist in [place] who has reviewed all of his workup and findings to date. Currently, the patient is leaning towards prostate brachytherapy or radical prostatectomy.  PREVIOUS RADIATION THERAPY: No  PAST MEDICAL HISTORY:  has a past medical history of Gout; Allergic rhinitis; Hemorrhoids; DDD (degenerative disc disease), lumbar; Prostate cancer; and Lumbar herniated disc (2007).    PAST SURGICAL HISTORY: Past Surgical History  Procedure Laterality Date  . Knee arthroscopy      Left  . Appendectomy  12/1998  . Lasik  7/07    [facility]  . Transurethral ultrasound guided prostate biopsy  01/30/2013    FAMILY HISTORY: family history includes Gout in his father; Hypertension in his father; Lung cancer in his maternal  grandfather and mother; and Prostate cancer in his father.  SOCIAL HISTORY:  reports that he quit smoking about 36 years ago. He does not have any smokeless tobacco history on file. He reports that  drinks alcohol. He reports that he does not use illicit drugs.  ALLERGIES: Celebrex and Oxymetazoline hcl  MEDICATIONS:  Current Outpatient Prescriptions  Medication Sig Dispense Refill  . allopurinol (ZYLOPRIM) 300 MG tablet take 1 and 1/2 tablet by mouth once daily  45 tablet  3  . gabapentin (NEURONTIN) 300 MG capsule Take 300 mg by mouth 3 (three) times daily. 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 glucosamine-chondroitin 500-400 MG tablet Take 1 tablet by mouth 2 (two) times daily.        . Multiple Vitamin (MULTIVITAMIN) tablet Take 1 tablet by mouth daily.        . Omega-3 Fatty Acids (FISH OIL) 1200 MG CAPS Take 1 capsule by mouth 2 (two) times daily.        . Saw Palmetto, Serenoa repens, (SAW PALMETTO PO) Take 2 capsules by mouth daily.         No current facility-administered medications for this encounter.    REVIEW OF SYSTEMS:  A 15 point review of systems is documented in the electronic medical record. This was obtained by the nursing staff. However, I reviewed this with the patient to discuss relevant findings and make appropriate changes.  A comprehensive review of systems was negative..  The patient completed an IPSS  and IIEF questionnaire.  His IPSS score was 3 indicating mild urinary outflow obstructive symptoms.  He indicated that his erectile function is able to complete sexual activity on most attempts.   PHYSICAL EXAM: This patient is in no acute distress.  He is alert and oriented.   height is 6\' 5"  (1.956 m) and weight is 259 lb 9.6 oz (117.754 kg). His oral temperature is 97 F (36.1 C). His blood pressure is 125/83 and his pulse is 41. His respiration is 16 and oxygen saturation is 100%.  He exhibits no respiratory distress or labored breathing.  He  appears neurologically intact.  His mood is pleasant.  His affect is appropriate.  Please note the digital rectal exam findings described above.  LABORATORY DATA:  Lab Results  Component Value Date   WBC 5.8 11/25/2008   HGB 16.3 11/25/2008   HCT 47.5 11/25/2008   MCV 94.9 11/25/2008   PLT 172 11/25/2008   Lab Results  Component Value Date   NA 141 01/09/2011   K 4.1 01/09/2011   CL 107 01/09/2011   CO2 27 01/09/2011   Lab Results  Component Value Date   ALT 23 06/14/2010   AST 26 06/14/2010   ALKPHOS 55 06/14/2010   BILITOT 0.8 06/14/2010     RADIOGRAPHY: No results found.    IMPRESSION: This gentleman is a  72 y.o. 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  His T-Stage, Gleason's Score, and PSA put him into the favorable risk group.  Accordingly he is eligible for a variety of potential treatment options including active surveillance, radical prostatectomy, external beam radiation, and prostate seed implant.  PLAN:Today I reviewed the findings and workup thus far.  We discussed the natural history of prostate cancer.  We reviewed the the implications of T-stage, Gleason's Score, and PSA on decision-making and outcomes in prostate cancer.  We discussed radiation treatment in the management of prostate cancer with regard to the logistics and delivery of external beam radiation treatment as well as the logistics and delivery of prostate brachytherapy.  We compared and contrasted each of these approaches and also compared these against prostatectomy.  The patient expressed interest in prostate brachytherapy. Usually, we'll a lot a patient counseling form. However, this patient was so well-informed and armed with questions that he guided our discussion covering all the major points related to the treatment options as well as technique details.   The patient would like to proceed with prostate brachytherapy.  I will share my findings with Dr.  Vernie Ammons and move forward with scheduling the procedure in the near future.     The patient does have a planned trip to [place] between May 28 and June 4. Ideally, he would like to undergo his prostate pubic arch study in the next few weeks in order to have his seed implant performed during the first or second week of June.  I enjoyed meeting with him today, and will look forward to participating in the care of this very nice gentleman.   I spent 60 minutes face to face with the patient and more than 50% of that time was spent in counseling and/or coordination of care.   ------------------------------------------------  Artist Pais. Kathrynn Running, M.D.

## 2013-03-18 ENCOUNTER — Encounter: Payer: Self-pay | Admitting: Radiation Oncology

## 2013-03-18 NOTE — Progress Notes (Signed)
GU Location of Tumor / Histology: right lobe of prostate  If Prostate Cancer, Gleason Score is (3 + 3)=6 in 76 cores and PSA is (4.62)  Patient presented March 2010 with signs/symptoms of: prostatitis  Biopsies of prostate (if applicable) revealed: adenocarcinoma cT2a  Past/Anticipated interventions by urology, if any: alpha blocker and antibiotic therapy for prostatitis in 2010 with viagra for erectile dysfunction  Past/Anticipated interventions by medical oncology, if any: None  Weight changes, if ZOX:WRUE noted  Bowel/Bladder complaints, if any: None noted   Nausea/Vomiting, if any: None noted  Pain issues, if any:  Perineal pain   SAFETY ISSUES:  Prior radiation? NO  Pacemaker/ICD? NO  Possible current pregnancy? NO  Is the patient on methotrexate? NO  Current Complaints / other details:  72 year old male. Referred by Dr. Ihor Gully. Interested most in radioactive seed implant.  January 2013 PSA 3.67 January 2014 PSA 4.53 Repeat (unsure of date) PSA 4.62  Father dx with prostate ca at the age of 12 followed by prostatectomy  Prostate volume 36cc  TRUS/BX done 01/2013

## 2013-03-19 ENCOUNTER — Encounter: Payer: Self-pay | Admitting: Radiation Oncology

## 2013-03-19 ENCOUNTER — Ambulatory Visit
Admission: RE | Admit: 2013-03-19 | Discharge: 2013-03-19 | Disposition: A | Discharge status: Home / Self Care | Payer: BC Managed Care – PPO | Source: Ambulatory Visit | Attending: Radiation Oncology | Admitting: Radiation Oncology

## 2013-03-19 ENCOUNTER — Telehealth: Payer: Self-pay | Admitting: *Deleted

## 2013-03-19 VITALS — BP 125/83 | HR 41 | Temp 97.0°F | Resp 16 | Ht 77.0 in | Wt 259.6 lb

## 2013-03-19 DIAGNOSIS — C61 Malignant neoplasm of prostate: Secondary | ICD-10-CM | POA: Insufficient documentation

## 2013-03-19 DIAGNOSIS — G609 Hereditary and idiopathic neuropathy, unspecified: Secondary | ICD-10-CM | POA: Insufficient documentation

## 2013-03-19 DIAGNOSIS — Z79899 Other long term (current) drug therapy: Secondary | ICD-10-CM | POA: Insufficient documentation

## 2013-03-19 HISTORY — DX: Other intervertebral disc displacement, lumbar region: M51.26

## 2013-03-19 NOTE — Assessment & Plan Note (Signed)
Planning for prostate brachytherapy in early June

## 2013-03-19 NOTE — Telephone Encounter (Signed)
Called patient to inform of pre-seed appt. For 03-21-13 at 1:00 pm, spoke with patient and he is aware of this appt.

## 2013-03-19 NOTE — Progress Notes (Signed)
See progress note under physician encounter. 

## 2013-03-19 NOTE — Progress Notes (Signed)
Patient presents to the clinic today accompanied by his wife for consultation with Dr. Kathrynn Running to discuss the role of radiation therapy in the treatment of prostate ca. Patient alert and oriented to person, place, and time. No distress noted. Steady gait noted. Pleasant affect noted. Patient denies perineal pain. Patient reports that he is active and runs three times per week. 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 Patient denies burning with urination. Patient reports hematuria for three days following biopsy but, denies any since then. Patient describes a strong normal urine stream. Patient reports on average he gets up at 0545 to void then, returns to back to sleep going on to say this is the only time he gets up to void. Patient denies diarrhea or pain with bowel movements. Patient reports hemorrhoids causing him to pass bright red blood occasionally.  Patient reports occasional episodes of vertigo and dizziness around allergy season only but, relates this to allergies and inner ear issue. Patient reports these symptoms are relieved with allergy medication. Patient denies nausea, vomiting, headache, diplopia, ringing of the ears, night sweats or unintentional weight loss. Reported all findings to Dr. Kathrynn Running.

## 2013-03-19 NOTE — Progress Notes (Signed)
Complete PATIENT MEASURE OF DISTRESS worksheet with a score of 0 submitted to social work.  

## 2013-03-20 ENCOUNTER — Encounter: Payer: Self-pay | Admitting: Family Medicine

## 2013-03-21 ENCOUNTER — Ambulatory Visit
Admission: RE | Admit: 2013-03-21 | Discharge: 2013-03-21 | Disposition: A | Discharge status: Home / Self Care | Payer: BC Managed Care – PPO | Source: Ambulatory Visit | Attending: Radiation Oncology | Admitting: Radiation Oncology

## 2013-03-21 DIAGNOSIS — C61 Malignant neoplasm of prostate: Secondary | ICD-10-CM

## 2013-03-21 NOTE — Progress Notes (Signed)
  Radiation Oncology         [phone]) [phone] ________________________________  Name: Joshua Dominguez MRN: [id number]  Date: 03/21/2013  DOB: 1941/09/15  SIMULATION AND TREATMENT PLANNING NOTE PUBIC ARCH STUDY  CC:  Garnett Farm, MD  DIAGNOSIS: 72 y.o. 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 He was brought to the radiation planning suite and placed supine on the CT couch. A 3-dimensional image study set was obtained in upload to the planning computer. There, on each axial slice, I contoured the prostate gland. Then, using three-dimensional radiation planning tools I reconstructed the prostate in view of the structures from the transperineal needle pathway to assess for possible pubic arch interference. In doing so, I did not appreciate any pubic arch interference. Also, the patient's prostate volume was estimated based on the drawn structure. The volume was 36 cc.  Given the pubic arch appearance and prostate volume, patient remains a good candidate to proceed with prostate seed implant. Today, he freely provided informed written consent to proceed.    PLAN: The patient will undergo prostate seed implant.   ________________________________  Artist Pais. Kathrynn Running, M.D.

## 2013-03-24 ENCOUNTER — Telehealth: Payer: Self-pay | Admitting: *Deleted

## 2013-03-24 ENCOUNTER — Other Ambulatory Visit: Payer: Self-pay | Admitting: Urology

## 2013-03-24 NOTE — Telephone Encounter (Signed)
CALLED PATIENT TO INFORM OF IMPLANT DATE, SPOKE WITH PATIENT AND HE IS AWARE OF THIS DATE 

## 2013-03-28 ENCOUNTER — Encounter (HOSPITAL_BASED_OUTPATIENT_CLINIC_OR_DEPARTMENT_OTHER): Payer: BC Managed Care – PPO

## 2013-03-28 ENCOUNTER — Other Ambulatory Visit: Payer: Self-pay

## 2013-03-28 ENCOUNTER — Ambulatory Visit (HOSPITAL_BASED_OUTPATIENT_CLINIC_OR_DEPARTMENT_OTHER)
Admission: RE | Admit: 2013-03-28 | Discharge: 2013-03-28 | Disposition: A | Discharge status: Home / Self Care | Payer: BC Managed Care – PPO | Source: Ambulatory Visit | Attending: Urology | Admitting: Urology

## 2013-03-28 DIAGNOSIS — C61 Malignant neoplasm of prostate: Secondary | ICD-10-CM | POA: Insufficient documentation

## 2013-03-28 DIAGNOSIS — Z01810 Encounter for preprocedural cardiovascular examination: Secondary | ICD-10-CM | POA: Insufficient documentation

## 2013-03-28 DIAGNOSIS — Z01818 Encounter for other preprocedural examination: Secondary | ICD-10-CM | POA: Insufficient documentation

## 2013-05-01 ENCOUNTER — Encounter (HOSPITAL_BASED_OUTPATIENT_CLINIC_OR_DEPARTMENT_OTHER): Payer: Self-pay | Admitting: *Deleted

## 2013-05-01 ENCOUNTER — Telehealth: Payer: Self-pay | Admitting: *Deleted

## 2013-05-01 NOTE — Progress Notes (Addendum)
SPOKE W/ PT WIFE. NPO AFTER MN. ARRIVES AT 0600. LABS TO DONE Monday OR TUES. NEXT WEEK. CURRENT EKG AND CXR IN EPIC AND CHART. WILL DO FLEET ENEMA.

## 2013-05-01 NOTE — Telephone Encounter (Signed)
CALLED PATIENT TO ASK QUESTION ,SPOKE WITH PATIENT AND I HAVE AN ANSWER

## 2013-05-08 ENCOUNTER — Telehealth: Payer: Self-pay | Admitting: *Deleted

## 2013-05-08 LAB — CBC
HCT: 44.2 % (ref 39.0–52.0)
Hemoglobin: 15.3 g/dL (ref 13.0–17.0)
MCH: 32.1 pg (ref 26.0–34.0)
MCHC: 34.6 g/dL (ref 30.0–36.0)
MCV: 92.7 fL (ref 78.0–100.0)
Platelets: 164 10*3/uL (ref 150–400)
RBC: 4.77 MIL/uL (ref 4.22–5.81)
RDW: 13 % (ref 11.5–15.5)
WBC: 6.5 10*3/uL (ref 4.0–10.5)

## 2013-05-08 LAB — COMPREHENSIVE METABOLIC PANEL
ALT: 17 U/L (ref 0–53)
AST: 21 U/L (ref 0–37)
Albumin: 3.5 g/dL (ref 3.5–5.2)
Alkaline Phosphatase: 69 U/L (ref 39–117)
BUN: 21 mg/dL (ref 6–23)
CO2: 26 mEq/L (ref 19–32)
Calcium: 9.5 mg/dL (ref 8.4–10.5)
Chloride: 106 mEq/L (ref 96–112)
Creatinine, Ser: 1.14 mg/dL (ref 0.50–1.35)
GFR calc Af Amer: 74 mL/min — ABNORMAL LOW (ref >90)
GFR calc non Af Amer: 64 mL/min — ABNORMAL LOW (ref >90)
Glucose, Bld: 111 mg/dL — ABNORMAL HIGH (ref 70–99)
Potassium: 3.9 mEq/L (ref 3.5–5.1)
Sodium: 141 mEq/L (ref 135–145)
Total Bilirubin: 0.5 mg/dL (ref 0.3–1.2)
Total Protein: 6.8 g/dL (ref 6.0–8.3)

## 2013-05-08 LAB — PROTIME-INR
INR: 0.99 (ref 0.00–1.49)
Prothrombin Time: 12.9 seconds (ref 11.6–15.2)

## 2013-05-08 LAB — APTT: aPTT: 30 seconds (ref 24–37)

## 2013-05-08 NOTE — Progress Notes (Signed)
Spoke with Shirley-pt to have labs drawn today.

## 2013-05-08 NOTE — Telephone Encounter (Signed)
Called patient to remind of procedure for 05-09-13, lvm for a return call

## 2013-05-09 ENCOUNTER — Ambulatory Visit (HOSPITAL_BASED_OUTPATIENT_CLINIC_OR_DEPARTMENT_OTHER)
Admission: RE | Admit: 2013-05-09 | Discharge: 2013-05-09 | Disposition: A | Discharge status: Home / Self Care | Payer: BC Managed Care – PPO | Source: Ambulatory Visit | Attending: Urology | Admitting: Urology

## 2013-05-09 ENCOUNTER — Encounter (HOSPITAL_BASED_OUTPATIENT_CLINIC_OR_DEPARTMENT_OTHER)
Admission: RE | Disposition: A | Discharge status: Home / Self Care | Payer: Self-pay | Source: Ambulatory Visit | Attending: Urology

## 2013-05-09 ENCOUNTER — Ambulatory Visit (HOSPITAL_BASED_OUTPATIENT_CLINIC_OR_DEPARTMENT_OTHER): Payer: BC Managed Care – PPO | Admitting: Anesthesiology

## 2013-05-09 ENCOUNTER — Ambulatory Visit (HOSPITAL_COMMUNITY): Payer: BC Managed Care – PPO

## 2013-05-09 ENCOUNTER — Encounter (HOSPITAL_BASED_OUTPATIENT_CLINIC_OR_DEPARTMENT_OTHER): Payer: Self-pay | Admitting: *Deleted

## 2013-05-09 ENCOUNTER — Encounter (HOSPITAL_BASED_OUTPATIENT_CLINIC_OR_DEPARTMENT_OTHER): Payer: Self-pay | Admitting: Anesthesiology

## 2013-05-09 DIAGNOSIS — C61 Malignant neoplasm of prostate: Secondary | ICD-10-CM | POA: Insufficient documentation

## 2013-05-09 DIAGNOSIS — Z79899 Other long term (current) drug therapy: Secondary | ICD-10-CM | POA: Insufficient documentation

## 2013-05-09 DIAGNOSIS — N529 Male erectile dysfunction, unspecified: Secondary | ICD-10-CM | POA: Insufficient documentation

## 2013-05-09 HISTORY — PX: RADIOACTIVE SEED IMPLANT: SHX5150

## 2013-05-09 HISTORY — DX: Personal history of other diseases of male genital organs: Z87.438

## 2013-05-09 HISTORY — DX: Male erectile dysfunction, unspecified: N52.9

## 2013-05-09 SURGERY — INSERTION, RADIATION SOURCE, PROSTATE
Anesthesia: General | Site: Prostate | Wound class: Clean

## 2013-05-09 MED ORDER — OXYCODONE HCL 5 MG PO TABS
5.0000 mg | ORAL_TABLET | Freq: Once | ORAL | Status: DC | PRN
  Filled 2013-05-09: qty 1

## 2013-05-09 MED ORDER — HYDROCODONE-ACETAMINOPHEN 10-325 MG PO TABS: 1.0000 | ORAL_TABLET | Freq: Four times a day (QID) | ORAL | Status: DC | PRN

## 2013-05-09 MED ORDER — MIDAZOLAM HCL 5 MG/5ML IJ SOLN
INTRAMUSCULAR | Status: DC | PRN
  Administered 2013-05-09: 2 mg via INTRAVENOUS

## 2013-05-09 MED ORDER — IOHEXOL 350 MG/ML SOLN
INTRAVENOUS | Status: DC | PRN
  Administered 2013-05-09: 7 mL

## 2013-05-09 MED ORDER — ACETAMINOPHEN 10 MG/ML IV SOLN
1000.0000 mg | Freq: Once | INTRAVENOUS | Status: DC | PRN
  Filled 2013-05-09: qty 100

## 2013-05-09 MED ORDER — HYDROMORPHONE HCL PF 1 MG/ML IJ SOLN
0.2500 mg | INTRAMUSCULAR | Status: DC | PRN
Start: 2013-05-09 — End: 2013-05-09
  Filled 2013-05-09: qty 1

## 2013-05-09 MED ORDER — CIPROFLOXACIN HCL 500 MG PO TABS: 500.0000 mg | ORAL_TABLET | Freq: Two times a day (BID) | ORAL | Status: DC

## 2013-05-09 MED ORDER — LACTATED RINGERS IV SOLN
INTRAVENOUS | Status: DC
  Administered 2013-05-09 (×2): via INTRAVENOUS
  Filled 2013-05-09: qty 1000

## 2013-05-09 MED ORDER — DEXAMETHASONE SODIUM PHOSPHATE 4 MG/ML IJ SOLN
INTRAMUSCULAR | Status: DC | PRN
  Administered 2013-05-09: 10 mg via INTRAVENOUS

## 2013-05-09 MED ORDER — STERILE WATER FOR IRRIGATION IR SOLN
Status: DC | PRN
  Administered 2013-05-09: 3 mL

## 2013-05-09 MED ORDER — PROMETHAZINE HCL 25 MG/ML IJ SOLN
6.2500 mg | INTRAMUSCULAR | Status: DC | PRN
  Filled 2013-05-09: qty 1

## 2013-05-09 MED ORDER — PROPOFOL 10 MG/ML IV BOLUS
INTRAVENOUS | Status: DC | PRN
  Administered 2013-05-09: 300 mg via INTRAVENOUS

## 2013-05-09 MED ORDER — FLEET ENEMA 7-19 GM/118ML RE ENEM
1.0000 | ENEMA | Freq: Once | RECTAL | Status: DC
  Filled 2013-05-09: qty 1

## 2013-05-09 MED ORDER — CIPROFLOXACIN IN D5W 400 MG/200ML IV SOLN
400.0000 mg | INTRAVENOUS | Status: AC
  Administered 2013-05-09: 400 mg via INTRAVENOUS
  Filled 2013-05-09: qty 200

## 2013-05-09 MED ORDER — OXYCODONE HCL 5 MG/5ML PO SOLN
5.0000 mg | Freq: Once | ORAL | Status: DC | PRN
  Filled 2013-05-09: qty 5

## 2013-05-09 MED ORDER — ONDANSETRON HCL 4 MG/2ML IJ SOLN
INTRAMUSCULAR | Status: DC | PRN
  Administered 2013-05-09: 4 mg via INTRAVENOUS

## 2013-05-09 MED ORDER — MEPERIDINE HCL 25 MG/ML IJ SOLN
6.2500 mg | INTRAMUSCULAR | Status: DC | PRN
  Filled 2013-05-09: qty 1

## 2013-05-09 MED ORDER — FENTANYL CITRATE 0.05 MG/ML IJ SOLN
INTRAMUSCULAR | Status: DC | PRN
  Administered 2013-05-09 (×2): 25 ug via INTRAVENOUS
  Administered 2013-05-09: 50 ug via INTRAVENOUS
  Administered 2013-05-09 (×4): 25 ug via INTRAVENOUS

## 2013-05-09 MED ORDER — STERILE WATER FOR IRRIGATION IR SOLN
Status: DC | PRN
  Administered 2013-05-09: 3000 mL

## 2013-05-09 MED ORDER — HYDROCODONE-ACETAMINOPHEN 10-325 MG PO TABS
1.0000 | ORAL_TABLET | Freq: Four times a day (QID) | ORAL | Status: DC | PRN
  Administered 2013-05-09: 1 via ORAL
  Filled 2013-05-09: qty 1

## 2013-05-09 MED ORDER — LIDOCAINE HCL (CARDIAC) 20 MG/ML IV SOLN
INTRAVENOUS | Status: DC | PRN
  Administered 2013-05-09: 100 mg via INTRAVENOUS

## 2013-05-09 SURGICAL SUPPLY — 29 items
BAG URINE DRAINAGE (UROLOGICAL SUPPLIES) ×2 IMPLANT
BLADE SURG ROTATE 9660 (MISCELLANEOUS) ×2 IMPLANT
CATH COUDE FOLEY 2W 5CC 16FR (CATHETERS) ×2 IMPLANT
CATH FOLEY 2WAY SLVR  5CC 16FR (CATHETERS) ×1
CATH FOLEY 2WAY SLVR 5CC 16FR (CATHETERS) ×1 IMPLANT
CATH ROBINSON RED A/P 20FR (CATHETERS) ×2 IMPLANT
CLOTH BEACON ORANGE TIMEOUT ST (SAFETY) ×2 IMPLANT
COVER MAYO STAND STRL (DRAPES) ×2 IMPLANT
COVER TABLE BACK 60X90 (DRAPES) ×2 IMPLANT
DRSG TEGADERM 4X4.75 (GAUZE/BANDAGES/DRESSINGS) ×2 IMPLANT
DRSG TEGADERM 8X12 (GAUZE/BANDAGES/DRESSINGS) ×2 IMPLANT
GAUZE SPONGE 4X4 12PLY STRL LF (GAUZE/BANDAGES/DRESSINGS) ×2 IMPLANT
GLOVE BIO SURGEON STRL SZ 6.5 (GLOVE) ×2 IMPLANT
GLOVE BIO SURGEON STRL SZ7.5 (GLOVE) ×2 IMPLANT
GLOVE BIO SURGEON STRL SZ8 (GLOVE) ×4 IMPLANT
GLOVE ECLIPSE 8.0 STRL XLNG CF (GLOVE) ×10 IMPLANT
GLOVE INDICATOR 7.0 STRL GRN (GLOVE) ×2 IMPLANT
GOWN STRL NON-REIN LRG LVL3 (GOWN DISPOSABLE) ×2 IMPLANT
GOWN STRL REIN XL XLG (GOWN DISPOSABLE) ×2 IMPLANT
GOWN XL W/COTTON TOWEL STD (GOWNS) IMPLANT
HOLDER FOLEY CATH W/STRAP (MISCELLANEOUS) ×2 IMPLANT
IV NS IRRIG 3000ML ARTHROMATIC (IV SOLUTION) IMPLANT
IV WATER IRR. 1000ML (IV SOLUTION) ×2 IMPLANT
PACK CYSTOSCOPY (CUSTOM PROCEDURE TRAY) ×2 IMPLANT
Radiation seeds ×2 IMPLANT
SYRINGE 10CC LL (SYRINGE) ×2 IMPLANT
UNDERPAD 30X30 INCONTINENT (UNDERPADS AND DIAPERS) ×4 IMPLANT
WATER STERILE IRR 3000ML UROMA (IV SOLUTION) IMPLANT
WATER STERILE IRR 500ML POUR (IV SOLUTION) ×2 IMPLANT

## 2013-05-09 NOTE — H&P (Signed)
History of Present Illness     Joshua Dominguez is a 72 year old male patient who has recently been diagnosed with prostate cancer.  Adenocarcinoma the prostate: His PSA in 1/13 was 3.67 in 1/14 it was noted to be 4.53/11%. He reports that he was told by Dr. Tanya Nones that there was an area that felt abnormal on DRE. He also indicates that a repeat PSA was done and he believes was 4.62. He has, as a risk factor for prostate cancer, father who had prostate cancer diagnosed at the age of 57 and had his prostate removed. On DRE I noted a ridge palpable in the lateral aspect of the prostate on the right-hand side. TRUS/BX 01/30/13 - Prostate volume was 36 cc Pathology: Her adenocarcinoma Gleason score 3+3 = 6 in 7 cores primarily from the right lobe. Stage: cT2a         Prostatitis: He had seen Dr. Logan Bores for this in the distant past. His symptoms consist primarily of perineal pain. I saw him in 3/10 for symptoms consistent with prostatitis and treated him with an alpha-blocker and antibiotic therapy.   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 It is mild in severity. He has used Viagra in the past but does not need it to obtain an erection.  IPSS 12/17/12 - 3/pleased   Results/Data   Partin table results: His probability of organ confined disease is 84% with an 18% probability of extracapsular extension, a 1% probability of seminal vesicle involvement and a 1.4% probability of lymph node involvement. His 5 year progression free probability with brachytherapy is 91% and his 5 and 10 year progression free probability with radical prostatectomy is 97% and 95% respectively.   Past Medical History Problems  1. History of  Gout 274.00  Surgical History Problems  1. History of  Appendectomy  Current Meds 1. Allopurinol TABS; Therapy: (Recorded:02Mar2010) to 2. Glucosamine CAPS; Therapy: (Recorded:02Mar2010) to 3. Levofloxacin 500 MG Oral Tablet; 1 po q day beginning the day prior to  biopsy; Therapy:  12Mar2014 to (Evaluate:15Mar2014)  Requested for: 12Mar2014; Last Rx:12Mar2014  Allergies Medication  1. Afrin Allergy SOLN 2. CeleBREX CAPS  Family History Problems  1. Paternal history of  Death In The Family Father age 52; hiking accident 2. Maternal history of  Death In The Family Mother age 43; lung cancer 3. Family history of  Family Health Status Number Of Children 2 sons 4. Paternal history of  Prostate Cancer V16.42  Social History Problems  1. Alcohol Use 2 a day 2. Caffeine Use 1 a day 3. Marital History - Currently Married 4. Occupation: professor 5. Tobacco Use V15.82 1pk a day for 10 yrs quit 32 yrs ago  Review of Systems Genitourinary, constitutional, skin, eye, otolaryngeal, hematologic/lymphatic, cardiovascular, pulmonary, endocrine, musculoskeletal, gastrointestinal, neurological and psychiatric system(s) were reviewed and pertinent findings if present are noted.  Genitourinary: nocturia and erectile dysfunction.    Vitals Vital Signs BMI Calculated: 28.1 BSA Calculated: 2.41 Height: 6 ft 5 in Weight: 238 lb  Blood Pressure: 129 / 73 Temperature: 97.5 F Heart Rate: 57  Physical Exam Constitutional: Well nourished and well developed . No acute distress.  ENT:. The ears and nose are normal in appearance.  Neck: The appearance of the neck is normal and no neck mass is present.  Pulmonary: No respiratory distress and normal respiratory rhythm and effort.  Cardiovascular: Heart rate and rhythm are normal . No peripheral edema.  Abdomen: The abdomen is soft and nontender. No masses  are palpated. No CVA tenderness. No hernias are palpable. No hepatosplenomegaly noted.  Rectal: Rectal exam demonstrates normal sphincter tone, no tenderness and no masses. Estimated prostate size is 1+. The prostate has a palpable nodule (There is a ridge-shaped raised area that is slightly firmer than the surrounding prostate in the lateral aspect of the  prostate on the right-hand side) involving the right of the prostate which appears to be confined within the prostate capsule and is not tender. The left seminal vesicle is nonpalpable. The right seminal vesicle is nonpalpable. The perineum is normal on inspection.  Genitourinary: Examination of the penis demonstrates no discharge, no masses, no lesions and a normal meatus. The scrotum is without lesions. Examination of the right scrotum demonstrates a hydrocele. Examination of the left scrotum demostrates a hydrocele. The right epididymis is palpably normal and non-tender. The left epididymis is palpably normal and non-tender. The right testis is non-tender and without masses. The left testis is non-tender and without masses.  Lymphatics: The femoral and inguinal nodes are not enlarged or tender.  Skin: Normal skin turgor, no visible rash and no visible skin lesions.  Neuro/Psych:. Mood and affect are appropriate.   Assessment Assessed  1. Adenocarcinoma Of The Prostate Gland 185  Plan The patient was counseled about the natural history of prostate cancer and the standard treatment options that are available for prostate cancer. It was explained to him how his age and life expectancy, clinical stage, Gleason score, and PSA affect his prognosis, the decision to proceed with additional staging studies, as well as how that information influences recommended treatment strategies. We discussed the roles for active surveillance, radiation therapy, surgical therapy, androgen deprivation, as well as ablative therapy options for the treatment of prostate cancer as appropriate to his individual cancer situation. We discussed the risks and benefits of these options with regard to their impact on cancer control and also in terms of potential adverse events, complications, and impact on quiality of life particularly related to urinary, bowel, and sexual function. The patient was encouraged to ask questions throughout  the discussion today and all questions were answered to his stated satisfaction. In addition, the patient was provided with and/or directed to appropriate resources and literature for further education about prostate cancer and treatment options.    We  had a lengthy discussion about his treatment options. He has done extensive amount of research and turned up 10 separate forms of treatment for organ confined prostate cancer. We discussed several of these however the bottom line is he is concerned about quality of life as much as he is about cure. That being said he is quite concerned about cure as well. He was concerned about incontinence primarily. We discussed the risk of this with surgery and the fact that radiation with seeds would likely cause very little risk of whatsoever of this occurring. He does understand there is risk of erectile dysfunction however. We went over the side effects. I told him that I didn't know if external beam radiation therapy would be necessary as he has moderate volume but low risk pathology. It would appear that he could potentially be treated with radioactive seeds alone. He would like to proceed with radioactive seed implantation.

## 2013-05-09 NOTE — Anesthesia Postprocedure Evaluation (Signed)
Anesthesia Post Note  Patient: Joshua Dominguez  Procedure(s) Performed: Procedure(s) (LRB): RADIOACTIVE SEED IMPLANT (N/A)  Anesthesia type: General  Patient location: PACU  Post pain: Pain level controlled  Post assessment: Post-op Vital signs reviewed  Last Vitals: BP 152/90  Pulse 58  Temp(Src) 36.1 C (Oral)  Resp 18  Ht 6\' 4"  (1.93 m)  Wt 256 lb 8 oz (116.348 kg)  BMI 31.24 kg/m2  SpO2 96%  Post vital signs: Reviewed  Level of consciousness: sedated  Complications: No apparent anesthesia complications

## 2013-05-09 NOTE — Op Note (Signed)
PATIENT:  Joshua Dominguez  PRE-OPERATIVE DIAGNOSIS:  Adenocarcinoma of the prostate  POST-OPERATIVE DIAGNOSIS:  Same  PROCEDURE:  Procedure(s): 1. I-125 radioactive seed implantation 2. Cystoscopy  SURGEON:  Surgeon(s): Garnett Farm  Radiation oncologist: Dr. Margaretmary Dys  ANESTHESIA:  General  EBL:  Minimal  DRAINS: 16 French Foley catheter  INDICATION: Joshua Dominguez the patient with biopsy-proven adenocarcinoma of the prostate Gleason score 3+3 = 6. He elected to proceed with radioactive seed implantation.  Description of procedure: After informed consent the patient was brought to the major OR, placed on the table and administered general anesthesia. He was then moved to the modified lithotomy position with his perineum perpendicular to the floor. His perineum and genitalia were then sterilely prepped. An official timeout was then performed. A 16 French Foley catheter was then placed in the bladder and filled with dilute contrast, a rectal tube was placed in the rectum and the transrectal ultrasound probe was placed in the rectum and affixed to the stand. He was then sterilely draped.  Real time ultrasonography was used along with the seed planning software spot-pro version 3.1-00. This was used to develop the seed plan including the number of needles as well as number of seeds required for complete and adequate coverage. Real-time ultrasonography was then used along with the previously developed plan and the Nucletron device to implant a total of 71 seeds using 22 needles. This proceeded without difficulty or complication.  A Foley catheter was then removed as well as the transrectal ultrasound probe and rectal probe. Flexible cystoscopy was then performed using the 17 French flexible scope which revealed a normal urethra throughout its length down to the sphincter which appeared intact. The prostatic urethra revealed bilobar hypertrophy but no evidence of obstruction, seeds,  spacers or lesions. The bladder was then entered and fully and systematically inspected. The ureteral orifices were noted to be of normal configuration and position. The mucosa revealed no evidence of tumors. There were also no stones identified within the bladder. I noted no seeds or spacers on the floor of the bladder and retroflexion of the scope revealed no seeds protruding from the base of the prostate.  The cystoscope was then removed and a new 16 French Foley catheter was then inserted and the balloon was filled with 10 cc of sterile water. This was connected to closed system drainage and the patient was awakened and taken to recovery room in stable and satisfactory condition. He tolerated procedure well and there were no intraoperative complications.

## 2013-05-09 NOTE — Anesthesia Preprocedure Evaluation (Addendum)
Anesthesia Evaluation  Patient identified by MRN, date of birth, ID band Patient awake    Reviewed: Allergy & Precautions, H&P , NPO status , Patient's Chart, lab work & pertinent test results  Airway Mallampati: II TM Distance: >3 FB Neck ROM: Full    Dental  (+) Dental Advisory Given and Teeth Intact   Pulmonary neg pulmonary ROS,  breath sounds clear to auscultation        Cardiovascular negative cardio ROS  Rhythm:Regular Rate:Normal     Neuro/Psych negative neurological ROS  negative psych ROS   GI/Hepatic negative GI ROS, Neg liver ROS,   Endo/Other  negative endocrine ROS  Renal/GU negative Renal ROS     Musculoskeletal negative musculoskeletal ROS (+)   Abdominal   Peds  Hematology negative hematology ROS (+)   Anesthesia Other Findings   Reproductive/Obstetrics                          Anesthesia Physical Anesthesia Plan  ASA: II  Anesthesia Plan: General   Post-op Pain Management:    Induction: Intravenous  Airway Management Planned: LMA  Additional Equipment:   Intra-op Plan:   Post-operative Plan: Extubation in OR  Informed Consent: I have reviewed the patients History and Physical, chart, labs and discussed the procedure including the risks, benefits and alternatives for the proposed anesthesia with the patient or authorized representative who has indicated his/her understanding and acceptance.   Dental advisory given  Plan Discussed with: CRNA  Anesthesia Plan Comments:         Anesthesia Quick Evaluation

## 2013-05-09 NOTE — Anesthesia Procedure Notes (Signed)
Procedure Name: LMA Insertion Performed by: Orchid Glassberg G Pre-anesthesia Checklist: Patient identified, Emergency Drugs available, Suction available and Patient being monitored Patient Re-evaluated:Patient Re-evaluated prior to inductionOxygen Delivery Method: Circle System Utilized Preoxygenation: Pre-oxygenation with 100% oxygen Intubation Type: IV induction Ventilation: Mask ventilation without difficulty LMA: LMA inserted LMA Size: 5.0 Number of attempts: 1 Airway Equipment and Method: bite block Placement Confirmation: positive ETCO2 Tube secured with: Tape Dental Injury: Teeth and Oropharynx as per pre-operative assessment      

## 2013-05-09 NOTE — Transfer of Care (Signed)
Immediate Anesthesia Transfer of Care Note  Patient: Joshua Dominguez  Procedure(s) Performed: Procedure(s) (LRB): RADIOACTIVE SEED IMPLANT (N/A)  Patient Location: PACU  Anesthesia Type: General  Level of Consciousness: awake, alert  and oriented  Airway & Oxygen Therapy: Patient Spontanous Breathing and Patient connected to face mask oxygen  Post-op Assessment: Report given to PACU RN and Post -op Vital signs reviewed and stable  Post vital signs: Reviewed and stable  Complications: No apparent anesthesia complications

## 2013-05-09 NOTE — Procedures (Signed)
  Radiation Oncology         [phone] ________________________________  Name: Joshua Dominguez MRN: [id number]  Date: 03/24/2013  DOB: 04-May-1941       Prostate Seed Implant  WU:[id number],NWGNFA TOM, MD  No ref. provider found  DIAGNOSIS: 72 y.o. 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  RADIATION DOSE: 145 Gy, definitive therapy.  TECHNIQUE: Joshua Dominguez was brought to the operating room with the urologist. He was placed in the dorsolithotomy position. He was catheterized and a rectal tube was inserted. The perineum was shaved, prepped and draped. The ultrasound probe was then introduced into the rectum to see the prostate gland.  TREATMENT DEVICE: A needle grid was attached to the ultrasound probe stand and anchor needles were placed.  3D PLANNING: The prostate was imaged in 3D using a sagittal sweep of the prostate probe. These images were transferred to the planning computer. There, the prostate, urethra and rectum were defined on each axial reconstructed image. Then, the software created an optimized plan and a few seed positions were adjusted. The quality of the plan was evaluated in terms of dose volume histograms and isodose reconstructions.  Then the accepted plan was uploaded to the seed Selectron afterloading unit.  SPECIAL TREATMENT PROCEDURE/SUPERVISION AND HANDLING: The Nucletron FIRST system was used to place the needles under sagittal guidance. A total of 22 needles were used to deposit 71 seeds in the prostate gland. The individual seed activity was 0.459 mCi for a total implant activity of 32.589 mCi.  COMPLEX SIMULATION: At the end of the procedure, an anterior radiograph of the pelvis was obtained to document seed positioning and count. Cystoscopy was performed to check the urethra and bladder.  MICRODOSIMETRY: At the end of the  procedure, the patient was emitting 0.036 mrem/hr at 1 meter. Accordingly, he was considered safe for hospital discharge.  PLAN: The patient will return to the radiation oncology clinic for post implant CT dosimetry in three weeks.   ________________________________  Artist Pais Kathrynn Running, M.D.

## 2013-05-13 ENCOUNTER — Encounter (HOSPITAL_BASED_OUTPATIENT_CLINIC_OR_DEPARTMENT_OTHER): Payer: Self-pay | Admitting: Urology

## 2013-05-28 ENCOUNTER — Telehealth: Payer: Self-pay | Admitting: *Deleted

## 2013-05-28 NOTE — Telephone Encounter (Signed)
CALLED PATIENT TO REMIND OF APPTS. FOR 05-29-13, CONFIRMED APPTS. WITH PATIENT. 

## 2013-05-29 ENCOUNTER — Ambulatory Visit
Admission: RE | Admit: 2013-05-29 | Discharge: 2013-05-29 | Disposition: A | Discharge status: Home / Self Care | Payer: BC Managed Care – PPO | Source: Ambulatory Visit | Attending: Radiation Oncology | Admitting: Radiation Oncology

## 2013-05-29 ENCOUNTER — Encounter: Payer: Self-pay | Admitting: Radiation Oncology

## 2013-05-29 ENCOUNTER — Other Ambulatory Visit: Payer: Self-pay | Admitting: Radiation Oncology

## 2013-05-29 VITALS — BP 130/84 | HR 67 | Temp 98.6°F | Resp 20

## 2013-05-29 DIAGNOSIS — C61 Malignant neoplasm of prostate: Secondary | ICD-10-CM

## 2013-05-29 NOTE — Progress Notes (Signed)
Radiation Oncology         [phone] ________________________________  Name: Joshua Dominguez MRN: [id number]  Date: 05/29/2013  DOB: 07/28/1941  Follow-Up Visit Note  CC: Leo Grosser, MD  Garnett Farm, MD  Diagnosis:   72 y.o. 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  He is complaining of increased urinary frequency and urinary hesitation symptoms. He filled out a questionnaire regarding urinary function today providing and overall IPSS score of 6 characterizing his symptoms as mild.  His pre-implant score was 3. He denies any bowel symptoms.  ALLERGIES:  is allergic to celebrex and oxymetazoline hcl.  Meds: Current Outpatient Prescriptions  Medication Sig Dispense Refill  . allopurinol (ZYLOPRIM) 300 MG tablet Take 300 mg by mouth daily. take 1 and 1/2 tablet by mouth once daily      . gabapentin (NEURONTIN) 300 MG capsule Take 300 mg by mouth 3 (three) times daily. 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 glucosamine-chondroitin 500-400 MG tablet Take 1 tablet by mouth 2 (two) times daily.       Marland Kitchen HYDROcodone-acetaminophen (NORCO) 10-325 MG per tablet Take 1 tablet by mouth every 6 (six) hours as needed for pain.  12 tablet  0  . ibuprofen (ADVIL,MOTRIN) 200 MG tablet Take 200 mg by mouth every 6 (six) hours as needed for pain.      . Multiple Vitamin (MULTIVITAMIN) tablet Take 1 tablet by mouth daily.       . Omega-3 Fatty Acids (FISH OIL) 1200 MG CAPS Take 1 capsule by mouth 2 (two) times daily.       . Saw Palmetto, Serenoa repens, (SAW PALMETTO PO) Take 2 capsules by mouth daily.        No current facility-administered medications for this encounter.    Physical Findings: The patient is in no acute distress. Patient is alert and oriented.  oral temperature is 98.6 F (37 C).  His blood pressure is 130/84 and his pulse is 67. His respiration is 20. Marland Kitchen  No significant changes.  Lab Findings: Lab Results  Component Value Date   WBC 6.5 05/08/2013   HGB 15.3 05/08/2013   HCT 44.2 05/08/2013   MCV 92.7 05/08/2013   PLT 164 05/08/2013    Radiographic Findings:  Patient underwent CT imaging in our clinic for post implant dosimetry. The CT appears to demonstrate an adequate distribution of radioactive seeds throughout the prostate gland. There no seeds in her near the rectum. I suspect the final radiation plan and dosimetry will show appropriate coverage of the prostate gland.   Impression: The patient is recovering from the effects of radiation. His urinary symptoms should gradually improve over the next 4-6 months. We talked about this today. He is encouraged by his improvement already and is otherwise please with his outcome.   Plan: Today, I spent time talking to the patient about his prostate seed implant and resolving urinary symptoms. We also talked about long-term follow-up for prostate cancer following seed implant. He understands that ongoing PSA determinations and digital rectal exams will help perform surveillance to rule out disease recurrence. He understands what to expect with his PSA measures. Patient was also educated today about some of the long-term effects would radiation including the Small risk for rectal bleeding and  possibly erectile dysfunction. Talked about some of the general management approaches to these potential complications. However, I did encourage the patient to contact her office or return at any point if he has questions or concerns related to his previous radiation and prostate cancer.   _____________________________________  Artist Pais. Kathrynn Running, M.D.

## 2013-05-29 NOTE — Progress Notes (Signed)
Pt states his post op issues of dysuria, hematuria w/blood clots and constipation w/bladder, bowel have resolved since his implant.  He continues to have some urinary urgency, need to strain to initiate urination at times, nocturia x 1. IPSS 6 today.

## 2013-05-29 NOTE — Progress Notes (Signed)
  Radiation Oncology         [phone] ________________________________  Name: Joshua Dominguez MRN: [id number]  Date: 05/29/2013  DOB: Feb 22, 1941  COMPLEX SIMULATION NOTE  NARRATIVE:  The patient was brought to the CT Simulation planning suite today following prostate seed implantation approximately one month ago.  Identity was confirmed.  All relevant records and images related to the planned course of therapy were reviewed.  Then, the patient was set-up supine.  CT images were obtained.  The CT images were loaded into the planning software.  Then the prostate and rectum were contoured.  Treatment planning then occurred.  The implanted iodine 125 seeds were identified by the physics staff for projection of radiation distribution  I have requested : 3D Simulation  I have requested a DVH of the following structures: Prostate and rectum.    ________________________________  Artist Pais Kathrynn Running, M.D.

## 2013-06-09 ENCOUNTER — Encounter: Payer: Self-pay | Admitting: Radiation Oncology

## 2013-06-10 NOTE — Progress Notes (Signed)
  Radiation Oncology         [phone] ________________________________  Name: DEMITRUS FRANCISCO MRN: [id number]  Date: 06/09/2013  DOB: 1940/12/07  3-D Planning Note Prostate Brachytherapy  Diagnosis: 72 y.o. 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 He underwent CT scan complex simulation to delineate the three-dimensional structures of the pelvis and demonstrate the radiation distribution.  Since that time, the seed localization, and 3D planning with dose volume histograms have now been completed.  Results:   Prostate Coverage - The dose of radiation delivered to the 90% or more of the prostate gland (D90) was in a 99.86 % of the prescription dose. This exceeds our goal of greater than 90%. Rectal Sparing - The volume of rectal tissue receiving the prescription dose or higher was 0.0 cc. This falls under our thresholds tolerance of 1.0 cc.  Impression: The prostate seed implant appears to show adequate target coverage and appropriate rectal sparing.  Plan:  The patient will continue to follow with urology for ongoing PSA determinations. I would anticipate a high likelihood for local tumor control with minimal risk for rectal morbidity.  ________________________________  Artist Pais Kathrynn Running, M.D.

## 2013-07-15 ENCOUNTER — Ambulatory Visit (INDEPENDENT_AMBULATORY_CARE_PROVIDER_SITE_OTHER): Payer: BC Managed Care – PPO | Admitting: Family Medicine

## 2013-07-15 ENCOUNTER — Encounter: Payer: Self-pay | Admitting: Family Medicine

## 2013-07-15 VITALS — BP 118/74 | HR 60 | Temp 98.1°F | Resp 16 | Ht 77.0 in | Wt 267.0 lb

## 2013-07-15 DIAGNOSIS — M109 Gout, unspecified: Secondary | ICD-10-CM

## 2013-07-15 LAB — BASIC METABOLIC PANEL
BUN: 22 mg/dL (ref 6–23)
Chloride: 107 mEq/L (ref 96–112)
Glucose, Bld: 86 mg/dL (ref 70–99)
Potassium: 4.6 mEq/L (ref 3.5–5.3)
Sodium: 141 mEq/L (ref 135–145)

## 2013-07-15 LAB — URIC ACID: Uric Acid, Serum: 5.4 mg/dL (ref 4.0–7.8)

## 2013-07-15 MED ORDER — PREDNISONE 20 MG PO TABS: ORAL_TABLET | ORAL | Status: DC

## 2013-07-15 MED ORDER — COLCHICINE 0.6 MG PO TABS: 0.6000 mg | ORAL_TABLET | Freq: Every day | ORAL | Status: DC

## 2013-07-15 NOTE — Progress Notes (Signed)
Subjective:    Patient ID: Joshua Dominguez, male    DOB: Jun 01, 1941, 72 y.o.   MRN: [id number]  HPI Patient is a very pleasant 72 year old white male with a history of gout. He is currently taking allopurinol 300 mg a day for prophylaxis. He is at 2 separate gout attacks this summer. 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  The joint itself is tender to the touch and slightly erythematous. It also has decreased range of motion.  Otherwise he is doing well. Past Medical History  Diagnosis Date  . Gout   . Allergic rhinitis   . Hemorrhoids   . DDD (degenerative disc disease), lumbar      L3-L4  . Lumbar herniated disc 2007    L4 and L5 did not require surgery  . Prostate cancer   . History of prostatitis   . ED (erectile dysfunction) of organic origin    Past Surgical History  Procedure Laterality Date  . Knee arthroscopy Left   . Appendectomy  FEB 2000  . Cataract extraction w/ intraocular lens  implant, bilateral    . Radioactive seed implant N/A 05/09/2013    Procedure: RADIOACTIVE SEED IMPLANT;  Surgeon: Garnett Farm, MD;  Location: [facility];  Service: Urology;  Laterality: N/A;   Current Outpatient Prescriptions on File Prior to Visit  Medication Sig Dispense Refill  . allopurinol (ZYLOPRIM) 300 MG tablet Take 300 mg by mouth daily. take 1 and 1/2 tablet by mouth once daily      . gabapentin (NEURONTIN) 300 MG capsule Take 300 mg by mouth 3 (three) times daily. 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 glucosamine-chondroitin 500-400 MG tablet Take 1 tablet by mouth 2 (two) times daily.       Marland Kitchen ibuprofen (ADVIL,MOTRIN) 200 MG tablet Take 200 mg by mouth every 6 (six) hours as needed for pain.      . Multiple Vitamin (MULTIVITAMIN) tablet Take 1 tablet by mouth daily.       . Omega-3 Fatty Acids (FISH OIL) 1200 MG CAPS Take 1 capsule by mouth 2  (two) times daily.       . Saw Palmetto, Serenoa repens, (SAW PALMETTO PO) Take 2 capsules by mouth daily.       Marland Kitchen HYDROcodone-acetaminophen (NORCO) 10-325 MG per tablet Take 1 tablet by mouth every 6 (six) hours as needed for pain.  12 tablet  0   No current facility-administered medications on file prior to visit.   Allergies  Allergen Reactions  . Celebrex [Celecoxib] Rash  . Oxymetazoline Hcl Rash   History   Social History  . Marital Status: Married    Spouse Name: N/A    Number of Children: N/A  . Years of Education: N/A   Occupational History  . Consulting/professor    Social History Main Topics  . Smoking status: Former Smoker    Quit date: 11/13/1976  . Smokeless tobacco: Never Used  . Alcohol Use: 8.4 oz/week    14 Glasses of wine per week  . Drug Use: No  . Sexual Activity: Not on file   Other Topics Concern  . 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       Objective:   Physical Exam  Vitals reviewed. Cardiovascular: Normal rate, regular rhythm and normal heart sounds.   No murmur heard. Pulmonary/Chest: Effort normal and breath sounds normal. No respiratory distress. He has no wheezes. He has no rales.   on the right foot the PIP joint of the fourth toe is erythematous swollen and tender to the touch        Assessment & Plan:  1. Gout attack Start prednisone taper pack to calm down  gout flare. Once the patient is asymptomatic for 2 weeks, I would switch allopurinol to uloric 80 mg by mouth daily hopefully to prevent gout flares more effectively.  When he starts the medication, he will need to add colchicine 0.6 mg by mouth daily as a  preventative for the first in 4 weeks. - predniSONE (DELTASONE) 20 MG tablet; 3 tabs poqday 1-2, 2 tabs poqday 3-4, 1 tabs poqday 5-6  Dispense: 12 tablet; Refill: 0 - Basic Metabolic Panel - Uric Acid - colchicine 0.6 MG tablet; Take 1 tablet (0.6 mg total) by mouth daily.  Dispense: 30 tablet; Refill: 0

## 2013-07-21 ENCOUNTER — Telehealth: Payer: Self-pay | Admitting: Family Medicine

## 2013-07-21 MED ORDER — FEBUXOSTAT 80 MG PO TABS: 80.0000 mg | ORAL_TABLET | Freq: Every day | ORAL | Status: DC

## 2013-07-21 NOTE — Telephone Encounter (Signed)
Pt said that you were going to change him from Allopurinol to something else. He is about out of his current medication. Do you want him on something else or stay on the same thing?

## 2013-07-21 NOTE — Telephone Encounter (Signed)
Med sent to pharm 

## 2013-07-21 NOTE — Telephone Encounter (Signed)
Patient aware.

## 2013-07-21 NOTE — Telephone Encounter (Signed)
Send uloric to pharm, but do not start it until asymptomatic for 2 weeks.

## 2013-08-31 ENCOUNTER — Telehealth: Payer: Self-pay | Admitting: Family Medicine

## 2013-08-31 DIAGNOSIS — M109 Gout, unspecified: Secondary | ICD-10-CM

## 2013-08-31 MED ORDER — COLCHICINE 0.6 MG PO TABS: 0.6000 mg | ORAL_TABLET | Freq: Every day | ORAL | Status: DC

## 2013-08-31 NOTE — Telephone Encounter (Signed)
Colcrys 0.6 mg

## 2013-10-02 ENCOUNTER — Encounter: Payer: Self-pay | Admitting: *Deleted

## 2013-12-10 ENCOUNTER — Other Ambulatory Visit: Payer: BC Managed Care – PPO

## 2013-12-10 DIAGNOSIS — Z0000 Encounter for general adult medical examination without abnormal findings: Secondary | ICD-10-CM

## 2013-12-10 LAB — LIPID PANEL
CHOL/HDL RATIO: 5.4 ratio
Cholesterol: 188 mg/dL (ref 0–200)
HDL: 35 mg/dL — ABNORMAL LOW (ref >39)
LDL CALC: 115 mg/dL — AB (ref 0–99)
Triglycerides: 190 mg/dL — ABNORMAL HIGH (ref <150)
VLDL: 38 mg/dL (ref 0–40)

## 2013-12-10 LAB — CBC WITH DIFFERENTIAL/PLATELET
BASOS ABS: 0 10*3/uL (ref 0.0–0.1)
BASOS PCT: 1 % (ref 0–1)
EOS ABS: 0.1 10*3/uL (ref 0.0–0.7)
Eosinophils Relative: 2 % (ref 0–5)
HCT: 45 % (ref 39.0–52.0)
Hemoglobin: 15.6 g/dL (ref 13.0–17.0)
Lymphocytes Relative: 37 % (ref 12–46)
Lymphs Abs: 1.9 10*3/uL (ref 0.7–4.0)
MCH: 31.5 pg (ref 26.0–34.0)
MCHC: 34.7 g/dL (ref 30.0–36.0)
MCV: 90.9 fL (ref 78.0–100.0)
Monocytes Absolute: 0.5 10*3/uL (ref 0.1–1.0)
Monocytes Relative: 10 % (ref 3–12)
NEUTROS PCT: 50 % (ref 43–77)
Neutro Abs: 2.6 10*3/uL (ref 1.7–7.7)
PLATELETS: 180 10*3/uL (ref 150–400)
RBC: 4.95 MIL/uL (ref 4.22–5.81)
RDW: 13.8 % (ref 11.5–15.5)
WBC: 5.2 10*3/uL (ref 4.0–10.5)

## 2013-12-10 LAB — COMPREHENSIVE METABOLIC PANEL
ALK PHOS: 57 U/L (ref 39–117)
ALT: 22 U/L (ref 0–53)
AST: 26 U/L (ref 0–37)
Albumin: 3.9 g/dL (ref 3.5–5.2)
BILIRUBIN TOTAL: 0.6 mg/dL (ref 0.2–1.2)
BUN: 23 mg/dL (ref 6–23)
CO2: 28 mEq/L (ref 19–32)
Calcium: 9 mg/dL (ref 8.4–10.5)
Chloride: 105 mEq/L (ref 96–112)
Creat: 1.2 mg/dL (ref 0.50–1.35)
Glucose, Bld: 85 mg/dL (ref 70–99)
Potassium: 4.2 mEq/L (ref 3.5–5.3)
SODIUM: 140 meq/L (ref 135–145)
TOTAL PROTEIN: 6.4 g/dL (ref 6.0–8.3)

## 2013-12-10 LAB — PSA: PSA: 0.91 ng/mL (ref <=4.00)

## 2013-12-11 ENCOUNTER — Ambulatory Visit (INDEPENDENT_AMBULATORY_CARE_PROVIDER_SITE_OTHER): Payer: BC Managed Care – PPO | Admitting: Family Medicine

## 2013-12-11 ENCOUNTER — Other Ambulatory Visit: Payer: Self-pay | Admitting: Urology

## 2013-12-11 ENCOUNTER — Encounter: Payer: Self-pay | Admitting: Family Medicine

## 2013-12-11 VITALS — BP 110/64 | HR 64 | Temp 97.2°F | Resp 16 | Ht 77.0 in | Wt 272.0 lb

## 2013-12-11 DIAGNOSIS — Z0000 Encounter for general adult medical examination without abnormal findings: Secondary | ICD-10-CM

## 2013-12-11 DIAGNOSIS — Z23 Encounter for immunization: Secondary | ICD-10-CM

## 2013-12-11 NOTE — Progress Notes (Signed)
Subjective:    Patient ID: Joshua Dominguez, male    DOB: [birth year], 73 y.o.   MRN: [id number]  HPI Patient is here today for complete physical exam. In June of 2014 the patient underwent radioactive seed implantation for his prostate adenocarcinoma.  His last office note for Dr. Tammi Klippel is copied below:  Diagnosis: 73 y.o. 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 He underwent CT scan complex simulation to delineate the three-dimensional structures of the pelvis and demonstrate the radiation distribution. Since that time, the seed localization, and 3D planning with dose volume histograms have now been completed.  Results:  Prostate Coverage - The dose of radiation delivered to the 90% or more of the prostate gland (D90) was in a 99.86 % of the prescription dose. This exceeds our goal of greater than 90%.  Rectal Sparing - The volume of rectal tissue receiving the prescription dose or higher was 0.0 cc. This falls under our thresholds tolerance of 1.0 cc.  Impression: The prostate seed implant appears to show adequate target coverage and appropriate rectal sparing.  Plan: The patient will continue to follow with urology for ongoing PSA determinations. I would anticipate a high likelihood for local tumor control with minimal risk for rectal morbidity.  Since that time his PSA has fallen from 4.53 to 0.91.  Patient is doing extremely well. He has no further concerns. His last colonoscopy was in 2010. He has had Zostavax. He has had Pneumovax 23. He has had his flu shot.  He is due for Prevnar  Lab on 12/10/2013  Component Date Value Range Status  . WBC 12/10/2013 5.2  4.0 - 10.5 K/uL Final  . RBC 12/10/2013 4.95  4.22 - 5.81 MIL/uL Final  . Hemoglobin 12/10/2013 15.6  13.0 - 17.0 g/dL Final  . HCT 12/10/2013 45.0   39.0 - 52.0 % Final  . MCV 12/10/2013 90.9  78.0 - 100.0 fL Final  . MCH 12/10/2013 31.5  26.0 - 34.0 pg Final  . MCHC 12/10/2013 34.7  30.0 - 36.0 g/dL Final  . RDW 12/10/2013 13.8  11.5 - 15.5 % Final  . Platelets 12/10/2013 180  150 - 400 K/uL Final  . Neutrophils Relative % 12/10/2013 50  43 - 77 % Final  . Neutro Abs 12/10/2013 2.6  1.7 - 7.7 K/uL Final  . Lymphocytes Relative 12/10/2013 37  12 - 46 % Final  . Lymphs Abs 12/10/2013 1.9  0.7 - 4.0 K/uL Final  . Monocytes Relative 12/10/2013 10  3 - 12 % Final  . Monocytes Absolute 12/10/2013 0.5  0.1 - 1.0 K/uL Final  . Eosinophils Relative 12/10/2013 2  0 - 5 % Final  . Eosinophils Absolute 12/10/2013 0.1  0.0 - 0.7 K/uL Final  . Basophils Relative 12/10/2013 1  0 - 1 % Final  . Basophils Absolute 12/10/2013 0.0  0.0 - 0.1 K/uL Final  . Smear Review 12/10/2013 Criteria for review not met   Final  . Sodium 12/10/2013 140  135 - 145 mEq/L Final  . Potassium 12/10/2013 4.2  3.5 - 5.3 mEq/L Final  . Chloride 12/10/2013 105  96 - 112 mEq/L Final  . CO2 12/10/2013 28  19 - 32 mEq/L Final  . Glucose, Bld 12/10/2013 85  70 - 99 mg/dL Final  . BUN 12/10/2013 23  6 - 23 mg/dL Final  . Creat 12/10/2013 1.20  0.50 - 1.35 mg/dL Final  . Total Bilirubin 12/10/2013 0.6  0.2 - 1.2 mg/dL Final   ** Please note change in reference range(s). **  . Alkaline Phosphatase 12/10/2013 57  39 - 117 U/L Final  . AST 12/10/2013 26  0 - 37 U/L Final  . ALT 12/10/2013 22  0 - 53 U/L Final  . Total Protein 12/10/2013 6.4  6.0 - 8.3 g/dL Final  . Albumin 12/10/2013 3.9  3.5 - 5.2 g/dL Final  . Calcium 12/10/2013 9.0  8.4 - 10.5 mg/dL Final  . Cholesterol 12/10/2013 188  0 - 200 mg/dL Final   Comment: ATP III Classification:                                < 200        mg/dL        Desirable                               200 - 239     mg/dL        Borderline High                               >= 240        mg/dL        High                             .  Triglycerides 12/10/2013 190* <150 mg/dL Final  . HDL 12/10/2013 35* >39 mg/dL Final  . Total CHOL/HDL Ratio 12/10/2013 5.4   Final  . VLDL 12/10/2013 38  0 - 40 mg/dL Final  . LDL Cholesterol 12/10/2013 115* 0 - 99 mg/dL Final   Comment:                            Total Cholesterol/HDL Ratio:CHD Risk                                                 Coronary Heart Disease Risk Table                                                                 Men       Women                                   1/2 Average Risk              3.4        3.3                                       Average  Risk              5.0        4.4                                    2X Average Risk              9.6        7.1                                    3X Average Risk             23.4       11.0                          Use the calculated Patient Ratio above and the CHD Risk table                           to determine the patient's CHD Risk.                          ATP III Classification (LDL):                                < 100        mg/dL         Optimal                               100 - 129     mg/dL         Near or Above Optimal                               130 - 159     mg/dL         Borderline High                               160 - 189     mg/dL         High                                > 190        mg/dL         Very High                             . PSA 12/10/2013 0.91  <=4.00 ng/mL Final   Comment: Test Methodology: ECLIA PSA (Electrochemiluminescence Immunoassay)                                                     For PSA values from 2.5-4.0, particularly in younger men <60 years  old, the AUA and NCCN suggest testing for % Free PSA (3515) and                          evaluation of the rate of increase in PSA (PSA velocity).   Past Medical History  Diagnosis Date  . Gout   . Allergic rhinitis   . Hemorrhoids   . DDD (degenerative disc disease), lumbar       L3-L4  . Lumbar herniated disc 2007    L4 and L5 did not require surgery  . Prostate cancer   . History of prostatitis   . ED (erectile dysfunction) of organic origin    Past Surgical History  Procedure Laterality Date  . Knee arthroscopy Left   . Appendectomy  FEB 2000  . Cataract extraction w/ intraocular lens  implant, bilateral    . Radioactive seed implant N/A 05/09/2013    Procedure: RADIOACTIVE SEED IMPLANT;  Surgeon: Claybon Jabs, MD;  Location: [facility];  Service: Urology;  Laterality: N/A;   Current Outpatient Prescriptions on File Prior to Visit  Medication Sig Dispense Refill  . colchicine 0.6 MG tablet Take 1 tablet (0.6 mg total) by mouth daily.  30 tablet  5  . Febuxostat (ULORIC) 80 MG TABS Take 1 tablet (80 mg total) by mouth daily.  30 tablet  5  . 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 glucosamine-chondroitin 500-400 MG tablet Take 1 tablet by mouth 2 (two) times daily.       Marland Kitchen ibuprofen (ADVIL,MOTRIN) 200 MG tablet Take 200 mg by mouth every 6 (six) hours as needed for pain.      . Multiple Vitamin (MULTIVITAMIN) tablet Take 1 tablet by mouth daily.       . Omega-3 Fatty Acids (FISH OIL) 1200 MG CAPS Take 1 capsule by mouth 2 (two) times daily.       . Saw Palmetto, Serenoa repens, (SAW PALMETTO PO) Take 2 capsules by mouth daily.       Marland Kitchen HYDROcodone-acetaminophen (NORCO) 10-325 MG per tablet Take 1 tablet by mouth every 6 (six) hours as needed for pain.  12 tablet  0   No current facility-administered medications on file prior to visit.   Allergies  Allergen Reactions  . Celebrex [Celecoxib] Rash  . Oxymetazoline Hcl Rash   History   Social History  . Marital Status: Married    Spouse Name: N/A    Number of Children: N/A  . Years of Education: N/A   Occupational History  . Consulting/professor    Social History Main Topics  . Smoking status:  Former Smoker    Quit date: 11/13/1976  . Smokeless tobacco: Never Used  . Alcohol Use: 8.4 oz/week    14 Glasses of wine per week  . Drug Use: No  . Sexual Activity: Not on file   Other Topics Concern  . 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 Hypertension Father   . Gout Father   . Prostate cancer Father   .  Lung cancer Mother     +smoker  . Lung cancer Maternal Grandfather     -smoker  . Heart disease Sister   . Heart disease Brother       Review of Systems  All other systems reviewed and are negative.       Objective:   Physical Exam  Vitals reviewed. Constitutional: He is oriented to person, place, and time. He appears well-developed and well-nourished. No distress.  HENT:  Head: Normocephalic and atraumatic.  Right Ear: External ear normal.  Left Ear: External ear normal.  Nose: Nose normal.  Mouth/Throat: Oropharynx is clear and moist. No oropharyngeal exudate.  Eyes: Conjunctivae and EOM are normal. Pupils are equal, round, and reactive to light. Right eye exhibits no discharge. Left eye exhibits no discharge. No scleral icterus.  Neck: Normal range of motion. Neck supple. No JVD present. No thyromegaly present.  Cardiovascular: Normal rate, regular rhythm and normal heart sounds.  Exam reveals no gallop and no friction rub.   No murmur heard. Pulmonary/Chest: Effort normal and breath sounds normal. No stridor. No respiratory distress. He has no wheezes. He has no rales. He exhibits no tenderness.  Abdominal: Soft. Bowel sounds are normal. He exhibits no distension and no mass. There is no tenderness. There is no rebound and no guarding.  Musculoskeletal: Normal range  of motion. He exhibits no edema and no tenderness.  Lymphadenopathy:    He has no cervical adenopathy.  Neurological: He is alert and oriented to person, place, and time. He has normal reflexes. He displays normal reflexes. No cranial nerve deficit. He exhibits normal muscle tone. Coordination normal.  Skin: Skin is warm. No rash noted. He is not diaphoretic. No erythema. No pallor.  Psychiatric: He has a normal mood and affect. His behavior is normal. Judgment and thought content normal.          Assessment & Plan:  Routine general medical examination at a health care facility  Patient's lab work is excellent.  His PSA has fallen substantially.  I recommended Prevnar 13 today in the office.  His other immunizations are up to date.  His cancer screening is up to date.  Patient's HDL is low. I recommended increasing aerobic exercise. He is already running one hour a day 3 days a week and lifting weights 3 days a week. Regular preventative care was discussed.

## 2013-12-25 ENCOUNTER — Encounter (HOSPITAL_BASED_OUTPATIENT_CLINIC_OR_DEPARTMENT_OTHER): Payer: Self-pay | Admitting: *Deleted

## 2013-12-25 NOTE — Progress Notes (Signed)
SPOKE W/ WIFE. NPO AFTER MN. ARRIVE AT 0600. NEEDS HG. WILL TAKE AM MEDS W/ SIPS OF WATER DOS.

## 2014-01-01 DIAGNOSIS — N433 Hydrocele, unspecified: Secondary | ICD-10-CM | POA: Diagnosis present

## 2014-01-02 ENCOUNTER — Ambulatory Visit (HOSPITAL_BASED_OUTPATIENT_CLINIC_OR_DEPARTMENT_OTHER)
Admission: RE | Admit: 2014-01-02 | Discharge: 2014-01-02 | Disposition: A | Discharge status: Home / Self Care | Payer: BC Managed Care – PPO | Source: Ambulatory Visit | Attending: Urology | Admitting: Urology

## 2014-01-02 ENCOUNTER — Encounter (HOSPITAL_BASED_OUTPATIENT_CLINIC_OR_DEPARTMENT_OTHER): Payer: Self-pay

## 2014-01-02 ENCOUNTER — Encounter (HOSPITAL_BASED_OUTPATIENT_CLINIC_OR_DEPARTMENT_OTHER): Payer: BC Managed Care – PPO | Admitting: Anesthesiology

## 2014-01-02 ENCOUNTER — Encounter (HOSPITAL_BASED_OUTPATIENT_CLINIC_OR_DEPARTMENT_OTHER)
Admission: RE | Disposition: A | Discharge status: Home / Self Care | Payer: Self-pay | Source: Ambulatory Visit | Attending: Urology

## 2014-01-02 ENCOUNTER — Ambulatory Visit (HOSPITAL_BASED_OUTPATIENT_CLINIC_OR_DEPARTMENT_OTHER): Payer: BC Managed Care – PPO | Admitting: Anesthesiology

## 2014-01-02 DIAGNOSIS — N4340 Spermatocele of epididymis, unspecified: Secondary | ICD-10-CM | POA: Insufficient documentation

## 2014-01-02 DIAGNOSIS — N529 Male erectile dysfunction, unspecified: Secondary | ICD-10-CM | POA: Insufficient documentation

## 2014-01-02 DIAGNOSIS — Z87891 Personal history of nicotine dependence: Secondary | ICD-10-CM | POA: Insufficient documentation

## 2014-01-02 DIAGNOSIS — Z8042 Family history of malignant neoplasm of prostate: Secondary | ICD-10-CM | POA: Insufficient documentation

## 2014-01-02 DIAGNOSIS — C61 Malignant neoplasm of prostate: Secondary | ICD-10-CM | POA: Insufficient documentation

## 2014-01-02 DIAGNOSIS — Z79899 Other long term (current) drug therapy: Secondary | ICD-10-CM | POA: Insufficient documentation

## 2014-01-02 DIAGNOSIS — N433 Hydrocele, unspecified: Secondary | ICD-10-CM | POA: Diagnosis present

## 2014-01-02 HISTORY — DX: Hydrocele, unspecified: N43.3

## 2014-01-02 HISTORY — PX: HYDROCELE EXCISION: SHX482

## 2014-01-02 LAB — POCT HEMOGLOBIN-HEMACUE: Hemoglobin: 15.9 g/dL (ref 13.0–17.0)

## 2014-01-02 SURGERY — HYDROCELECTOMY
Anesthesia: General | Site: Scrotum | Laterality: Bilateral

## 2014-01-02 MED ORDER — ONDANSETRON HCL 4 MG/2ML IJ SOLN
INTRAMUSCULAR | Status: DC | PRN
  Administered 2014-01-02: 4 mg via INTRAVENOUS

## 2014-01-02 MED ORDER — MIDAZOLAM HCL 5 MG/5ML IJ SOLN
INTRAMUSCULAR | Status: DC | PRN
  Administered 2014-01-02: 2 mg via INTRAVENOUS

## 2014-01-02 MED ORDER — LIDOCAINE HCL (CARDIAC) 20 MG/ML IV SOLN
INTRAVENOUS | Status: DC | PRN
  Administered 2014-01-02: 100 mg via INTRAVENOUS

## 2014-01-02 MED ORDER — OXYCODONE-ACETAMINOPHEN 10-325 MG PO TABS: 1.0000 | ORAL_TABLET | ORAL | Status: DC | PRN

## 2014-01-02 MED ORDER — DEXAMETHASONE SODIUM PHOSPHATE 4 MG/ML IJ SOLN
INTRAMUSCULAR | Status: DC | PRN
  Administered 2014-01-02: 10 mg via INTRAVENOUS

## 2014-01-02 MED ORDER — MIDAZOLAM HCL 2 MG/2ML IJ SOLN
INTRAMUSCULAR | Status: AC
  Filled 2014-01-02: qty 2

## 2014-01-02 MED ORDER — BUPIVACAINE-EPINEPHRINE 0.5% -1:200000 IJ SOLN
INTRAMUSCULAR | Status: DC | PRN
Start: 2014-01-02 — End: 2014-01-02
  Administered 2014-01-02: 16 mL

## 2014-01-02 MED ORDER — LACTATED RINGERS IV SOLN
INTRAVENOUS | Status: DC
  Filled 2014-01-02: qty 1000

## 2014-01-02 MED ORDER — FENTANYL CITRATE 0.05 MG/ML IJ SOLN
25.0000 ug | INTRAMUSCULAR | Status: DC | PRN
  Filled 2014-01-02: qty 1

## 2014-01-02 MED ORDER — CEFAZOLIN SODIUM-DEXTROSE 2-3 GM-% IV SOLR
2.0000 g | INTRAVENOUS | Status: AC
  Administered 2014-01-02: 2 g via INTRAVENOUS
  Filled 2014-01-02: qty 50

## 2014-01-02 MED ORDER — FENTANYL CITRATE 0.05 MG/ML IJ SOLN
INTRAMUSCULAR | Status: AC
  Filled 2014-01-02: qty 6

## 2014-01-02 MED ORDER — FENTANYL CITRATE 0.05 MG/ML IJ SOLN
INTRAMUSCULAR | Status: DC | PRN
  Administered 2014-01-02: 50 ug via INTRAVENOUS
  Administered 2014-01-02: 25 ug via INTRAVENOUS
  Administered 2014-01-02: 50 ug via INTRAVENOUS

## 2014-01-02 MED ORDER — ACETAMINOPHEN 10 MG/ML IV SOLN
INTRAVENOUS | Status: DC | PRN
  Administered 2014-01-02: 1000 mg via INTRAVENOUS

## 2014-01-02 MED ORDER — LACTATED RINGERS IV SOLN
INTRAVENOUS | Status: DC
  Administered 2014-01-02: 07:00:00 via INTRAVENOUS
  Filled 2014-01-02: qty 1000

## 2014-01-02 MED ORDER — PROPOFOL 10 MG/ML IV BOLUS
INTRAVENOUS | Status: DC | PRN
  Administered 2014-01-02: 300 mg via INTRAVENOUS

## 2014-01-02 SURGICAL SUPPLY — 43 items
BANDAGE GAUZE ELAST BULKY 4 IN (GAUZE/BANDAGES/DRESSINGS) ×2 IMPLANT
BLADE SURG 15 STRL LF DISP TIS (BLADE) ×1 IMPLANT
BLADE SURG 15 STRL SS (BLADE) ×1
BLADE SURG ROTATE 9660 (MISCELLANEOUS) IMPLANT
BNDG GAUZE ELAST 4 BULKY (GAUZE/BANDAGES/DRESSINGS) ×2 IMPLANT
CANISTER SUCTION 1200CC (MISCELLANEOUS) IMPLANT
CANISTER SUCTION 2500CC (MISCELLANEOUS) ×2 IMPLANT
CLEANER CAUTERY TIP 5X5 PAD (MISCELLANEOUS) IMPLANT
CLOTH BEACON ORANGE TIMEOUT ST (SAFETY) IMPLANT
COVER MAYO STAND STRL (DRAPES) ×2 IMPLANT
COVER TABLE BACK 60X90 (DRAPES) ×2 IMPLANT
DISSECTOR ROUND CHERRY 3/8 STR (MISCELLANEOUS) IMPLANT
DRAIN PENROSE 18X1/4 LTX STRL (WOUND CARE) IMPLANT
DRAPE PED LAPAROTOMY (DRAPES) ×2 IMPLANT
ELECT REM PT RETURN 9FT ADLT (ELECTROSURGICAL)
ELECTRODE REM PT RTRN 9FT ADLT (ELECTROSURGICAL) IMPLANT
GLOVE BIO SURGEON STRL SZ8 (GLOVE) ×2 IMPLANT
GLOVE BIOGEL PI IND STRL 6.5 (GLOVE) ×1 IMPLANT
GLOVE BIOGEL PI IND STRL 7.5 (GLOVE) ×1 IMPLANT
GLOVE BIOGEL PI INDICATOR 6.5 (GLOVE) ×1
GLOVE BIOGEL PI INDICATOR 7.5 (GLOVE) ×1
GOWN PREVENTION PLUS LG XLONG (DISPOSABLE) IMPLANT
GOWN STRL REIN XL XLG (GOWN DISPOSABLE) IMPLANT
GOWN STRL REUS W/TWL XL LVL3 (GOWN DISPOSABLE) ×4 IMPLANT
NEEDLE HYPO 25X1 1.5 SAFETY (NEEDLE) ×2 IMPLANT
NS IRRIG 500ML POUR BTL (IV SOLUTION) ×2 IMPLANT
PACK BASIN DAY SURGERY FS (CUSTOM PROCEDURE TRAY) ×2 IMPLANT
PAD CLEANER CAUTERY TIP 5X5 (MISCELLANEOUS)
PENCIL BUTTON HOLSTER BLD 10FT (ELECTRODE) ×2 IMPLANT
SUPPORT SCROTAL LG STRP (MISCELLANEOUS) ×2 IMPLANT
SUT CHROMIC 3 0 SH 27 (SUTURE) ×8 IMPLANT
SUT ETHILON 3 0 PS 1 (SUTURE) IMPLANT
SUT SILK 4 0 TIES 17X18 (SUTURE) IMPLANT
SUT VIC AB 4-0 RB1 27 (SUTURE)
SUT VIC AB 4-0 RB1 27X BRD (SUTURE) IMPLANT
SUT VICRYL 6 0 RB 1 (SUTURE) IMPLANT
SYR CONTROL 10ML LL (SYRINGE) ×2 IMPLANT
TOWEL OR 17X24 6PK STRL BLUE (TOWEL DISPOSABLE) ×4 IMPLANT
TRAY DSU PREP LF (CUSTOM PROCEDURE TRAY) ×2 IMPLANT
TUBE CONNECTING 12X1/4 (SUCTIONS) ×2 IMPLANT
WATER STERILE IRR 3000ML UROMA (IV SOLUTION) IMPLANT
WATER STERILE IRR 500ML POUR (IV SOLUTION) IMPLANT
YANKAUER SUCT BULB TIP NO VENT (SUCTIONS) ×2 IMPLANT

## 2014-01-02 NOTE — Transfer of Care (Signed)
Immediate Anesthesia Transfer of Care Note  Patient: Joshua Dominguez  Procedure(s) Performed: Procedure(s) (LRB): BILATERAL HYDROCELECTOMY (Bilateral)  Patient Location: PACU  Anesthesia Type: General  Level of Consciousness: awake, alert  and oriented  Airway & Oxygen Therapy: Patient Spontanous Breathing and Patient connected to face mask oxygen  Post-op Assessment: Report given to PACU RN and Post -op Vital signs reviewed and stable  Post vital signs: Reviewed and stable  Complications: No apparent anesthesia complications

## 2014-01-02 NOTE — Anesthesia Postprocedure Evaluation (Signed)
  Anesthesia Post-op Note  Patient: Joshua Dominguez  Procedure(s) Performed: Procedure(s) (LRB): BILATERAL HYDROCELECTOMY (Bilateral)  Patient Location: PACU  Anesthesia Type: General  Level of Consciousness: awake and alert   Airway and Oxygen Therapy: Patient Spontanous Breathing  Post-op Pain: mild  Post-op Assessment: Post-op Vital signs reviewed, Patient's Cardiovascular Status Stable, Respiratory Function Stable, Patent Airway and No signs of Nausea or vomiting  Last Vitals:  Filed Vitals:   01/02/14 0900  BP: 137/81  Pulse: 65  Temp:   Resp: 10    Post-op Vital Signs: stable   Complications: No apparent anesthesia complications

## 2014-01-02 NOTE — H&P (Signed)
Joshua Dominguez is a 73 year-old male with CaP who was seen for bilateral hydroceles.   History of Present Illness Prostate cancer: His PSA in 1/13 was 3.67 in 1/14 it was noted to be 4.53/11%. He reports that he was told by Dr. Dennard Dominguez that there was an area that felt abnormal on DRE. He also indicates that a repeat PSA was done and he believes was 4.62. He has, as a risk factor for prostate cancer, father who had prostate cancer diagnosed at the age of 25 and had his prostate removed.  On DRE I noted a ridge palpable in the lateral aspect of the prostate on the right-hand side.  TRUS/Bx 01/30/13 - Prostate vol. - 36cc  Pathology: Gleason 3+3=6 in 7/12 cores.  Treatment: I-125 seeds 05/09/13.      Prostatitis: He had seen Dr. Amalia Dominguez for this in the distant past. His symptoms consist primarily of perineal pain. I saw him in 3/10 for symptoms consistent with prostatitis and treated him with an alpha-blocker and antibiotic therapy.     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 He noted it worsened somewhat after he underwent treatment of his prostate cancer with radioactive seeds.    Interval history: When I saw him last he was having difficulty with erectile dysfunction and gave him samples of Stenra 100 mg. He was also experiencing LUTS so I gave him samples of Myrbetriq 25 mg to see if this would help with his voiding symptoms. I felt this was going to be self-limited due to effects of his seed implantation. He was experienced urgency, frequency an increase in his nocturia.  He reports today that he was out skiing with a friend who is a urologist and mentioned that he had bilateral hydroceles. His friend mentioned getting these treated either surgically or with aspiration and sclerosing. He came to discuss this. His hydroceles are symptomatic in that they are uncomfortable and get in the way. They've been present for some time. They're not improving and the size of his  hydroceles have resulted in the skin of his penis being pushed down the shaft and resulting in the appearance of an uncircumcised penis.     Past Medical History Problems  1. History of Gout (274.00) 2. 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 History of Plantar fasciitis (728.71)  Surgical History Problems  1. History of Appendectomy 2. History of Biopsy Of The Prostate Needle 3. History of Surgery Prostate Transperineal Placement Of Needles  Current Meds 1. Amitriptyline HCl - 25 MG Oral Tablet;  Therapy: [id number] to Recorded 2. Fish Oil CAPS;  Therapy: (Recorded:21Jan2015) to Recorded 3. Gabapentin 300 MG Oral Capsule;  Therapy: [id number] to Recorded 4. Glucosamine Chondroitin Joint TABS;  Therapy: (Recorded:21Jan2015) to Recorded 5. Multi-Vitamin TABS;  Therapy: (Recorded:18Jul2014) to Recorded 6. Stendra 100 MG Oral Tablet; Take 1 tablet as needed;  Therapy: 07Oct2014 to (Last Rx:07Oct2014) Ordered 7. Uloric 80 MG Oral Tablet;  Therapy: 08Sep2014 to Recorded 8. Vitamin C TABS;  Therapy: (Recorded:21Jan2015) to Recorded 9. Vitamin D-3 TABS;  Therapy: (Recorded:21Jan2015) to Recorded  Allergies Medication  1. Afrin Allergy SOLN 2. CeleBREX CAPS  Family History Problems  1. Family history of Death In The Family Father : Father   age 25; hiking accident 2. Family history of Death In The Family Mother : Mother   age 34; lung cancer 3. Family history of Family Health Status Number Of Children   2 sons 4. Family history of Prostate Cancer (G99.24) :  Father  Social History Problems  1. Alcohol Use   2 a day 2. Caffeine Use   1 a day 3. Former smoker (V15.82) 4. Marital History - Currently Married 5. Occupation:   professor 6. Tobacco Use (V15.82)   1pk a day for 10 yrs quit 32 yrs ago  Review of Systems Genitourinary, constitutional, skin, eye, otolaryngeal, hematologic/lymphatic, cardiovascular, pulmonary, endocrine, musculoskeletal,  gastrointestinal, neurological and psychiatric system(s) were reviewed and pertinent findings if present are noted.  Genitourinary: nocturia and scrotal swelling.    Vitals  Blood Pressure: 134 / 83  Temperature: 96.9 F  Heart Rate: 64  Physical Exam Constitutional: Well nourished and well developed. No acute distress.  ENT: The ears and nose are normal in appearance.  Neck: The appearance of the neck is normal and no neck mass is present.  Pulmonary: No respiratory distress and normal respiratory rhythm and effort.  Cardiovascular: Heart rate and rhythm are normal. No peripheral edema.  Abdomen: The abdomen is soft and nontender. No masses are palpated. No CVA tenderness. No hernias are palpable. No hepatosplenomegaly noted.  Rectal exam demonstrates an internal hemorrhoid, but normal sphincter tone, no tenderness and no masses. Estimated prostate size is 2+. The prostate has no nodularity and is not tender. The left seminal vesicle is nonpalpable. The right seminal vesicle is nonpalpable. The perineum is normal on inspection.  Lymphatics: The femoral and inguinal nodes are not enlarged or tender.  Skin: Normal skin turgor, no visible rash and no visible skin lesions.  Neuro/Psych: Mood and affect are appropriate.  Genitourinary: Examination of the penis demonstrates no discharge, no masses, no lesions and a normal meatus. The penis is circumcised. The scrotum is without lesions. Examination of the right scrotum demonstrates a hydrocele. Examination of the left scrotum demostrates a hydrocele. The right epididymis is palpably normal and non-tender. The left epididymis is palpably normal and non-tender. The right testis is non-tender and without masses. The left testis is non-tender and without masses.    Results/Data  PSA. Selected Results  UA With REFLEX Joshua Dominguez SPECIMEN TYPE: CLEAN CATCH  Test Name Result Flag Reference COLOR YELLOW  YELLOW APPEARANCE CLEAR  CLEAR SPECIFIC  GRAVITY 1.025  1.005-1.030 pH 5.5  5.0-8.0 GLUCOSE NEG mg/dL  NEG BILIRUBIN NEG  NEG KETONE NEG mg/dL  NEG BLOOD NEG  NEG PROTEIN NEG mg/dL  NEG UROBILINOGEN 0.2 mg/dL  0.0-1.0 NITRITE NEG  NEG LEUKOCYTE ESTERASE NEG  NEG  Assessment Assessed  1. 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 We discussed the treatment options of which aspiration and the placement of a sclerosing agent he is one however I didn't recommend this as the risk of recurrence is greater then surgical correction. We discussed the procedure of hydrocelectomy in detail today. I went over the incision used, outpatient nature of the procedure, the probability of success, the risks and complications and the anticipated postoperative course. He had many questions which have been answered to his satisfaction. He has elected to proceed with surgical correction. He will be scheduled for an outpatient hydrocelectomy   Plan   He is will be scheduled for a bilateral hydrocelectomy

## 2014-01-02 NOTE — Anesthesia Preprocedure Evaluation (Addendum)
Anesthesia Evaluation  Patient identified by MRN, date of birth, ID band Patient awake    Reviewed: Allergy & Precautions, H&P , NPO status , Patient's Chart, lab work & pertinent test results  Airway Mallampati: II TM Distance: >3 FB Neck ROM: full    Dental  (+) Caps, Dental Advisory Given Both upper front are capped:   Pulmonary neg pulmonary ROS, former smoker,  breath sounds clear to auscultation  Pulmonary exam normal       Cardiovascular Exercise Tolerance: Good negative cardio ROS  Rhythm:regular Rate:Normal     Neuro/Psych negative neurological ROS  negative psych ROS   GI/Hepatic negative GI ROS, Neg liver ROS,   Endo/Other  negative endocrine ROS  Renal/GU negative Renal ROS  negative genitourinary   Musculoskeletal   Abdominal   Peds  Hematology negative hematology ROS (+)   Anesthesia Other Findings   Reproductive/Obstetrics negative OB ROS                           Anesthesia Physical  Anesthesia Plan  ASA: II  Anesthesia Plan: General   Post-op Pain Management:    Induction: Intravenous  Airway Management Planned: LMA  Additional Equipment:   Intra-op Plan:   Post-operative Plan:   Informed Consent: I have reviewed the patients History and Physical, chart, labs and discussed the procedure including the risks, benefits and alternatives for the proposed anesthesia with the patient or authorized representative who has indicated his/her understanding and acceptance.   Dental Advisory Given  Plan Discussed with: CRNA and Surgeon  Anesthesia Plan Comments:         Anesthesia Quick Evaluation  

## 2014-01-02 NOTE — Op Note (Signed)
PATIENT:  Joshua Dominguez  PRE-OPERATIVE DIAGNOSIS: Bilateral hydroceles  POST-OPERATIVE DIAGNOSIS:  1. Left hydrocele 2. Right spermatocele  PROCEDURE:  Procedure(s): 1. Left hydrocelectomy 2. Right spermatocelectomy  SURGEON:  Claybon Jabs  INDICATION: Joshua Dominguez is a 73 year old male with bilateral scrotal swelling. The swelling is were consistent with bilateral hydroceles. It appeared the right hydrocele seemed a little bit higher in the right hemiscrotum the as compared to the left-hand side. They are symptomatic and he has therefore elected to proceed with surgical correction.  ANESTHESIA:  General  EBL:  Minimal  DRAINS: None  LOCAL MEDICATIONS USED:  16 cc of 0.5% Marcaine with epinephrine  SPECIMEN:  None  DISPOSITION OF SPECIMEN:  N/A  Description of procedure: After informed consent the patient was brought to the major OR, placed on the table and administered general anesthesia. His genitalia was then sterilely prepped and draped. An official timeout was then performed.  A midline median raphae scrotal incision was then made and carried down over the left hydrocele. The tissue over the hydrocele was cleared using a combination of sharp and blunt technique. The hydrocele was then opened, drained of clear amber fluid and delivered through the incision. The excess hydrocele sac tissue was excised with the Bovie electrocautery and then I cauterized the edges. I then reapproximated the edges posteriorly behind the epididymis with a running, locking 3-0 chromic suture. The appendix testis was removed with the Bovie.   His testicle was then replaced in the normal anatomic position and his Left hemiscrotum. And turned my attention to the right scrotal swelling. I incised the tissue over this and cleared tissue with both blunt and sharp dissection. It appeared to be located anterior to the testicle. When I incised the area I noted a clear, thin, milky fluid consistent with a  spermatocele. I incised this completely and drained the content. I then marsupialized the cyst and excised the anterior two thirds of the cyst wall. The appendix testis and appendix epididymis were identified and removed with the electrocautery. There were 3 small epididymal cyst that were then incised with the cautery and drained of a similar fluid. These were all consistent with spermatoceles. I did close the parietal tunica vaginalis posterior to the testicle with running, locking 3-0 chromic. I then placed the right testicle back into the right hemiscrotum in its normal anatomic position.  I then closed the deep scrotal tissue with running 3-0 chromic suture in a locking fashion. I injected half percent Marcaine with epinephrine in the subcutaneous tissue and closed the skin with running 3-0 chromic. Neosporin, a sterile gauze dressing, fluff Kerlix and a scrotal support were applied. The patient tolerated the procedure well no intraoperative complications. Needle sponge and instrument counts were correct at the end of the operation.   PLAN OF CARE: Discharge to home after PACU  PATIENT DISPOSITION:  PACU - hemodynamically stable.

## 2014-01-02 NOTE — Discharge Instructions (Signed)
Scrotal surgery postoperative instructions ° °Wound: ° °In most cases your incision will have absorbable sutures that will dissolve within the first 10-20 days. Some will fall out even earlier. Expect some redness as the sutures dissolved but this should occur only around the sutures. If there is generalized redness, especially with increasing pain or swelling, let us know. The scrotum will very likely get "black and blue" as the blood in the tissues spread. Sometimes the whole scrotum will turn colors. The black and blue is followed by a yellow and brown color. In time, all the discoloration will go away. In some cases some firm swelling in the area of the testicle may persist for up to 4-6 weeks after the surgery and is considered normal in most cases. ° °Diet: ° °You may return to your normal diet within 24 hours following your surgery. You may note some mild nausea and possibly vomiting the first 6-8 hours following surgery. This is usually due to the side effects of anesthesia, and will disappear quite soon. I would suggest clear liquids and a very light meal the first evening following your surgery. ° °Activity: ° °Your physical activity should be restricted the first 48 hours. During that time you should remain relatively inactive, moving about only when necessary. During the first 7-10 days following surgery he should avoid lifting any heavy objects (anything greater than 15 pounds), and avoid strenuous exercise. If you work, ask us specifically about your restrictions, both for work and home. We will write a note to your employer if needed. ° °You should plan to wear a tight pair of jockey shorts or an athletic supporter for the first 4-5 days, even to sleep. This will keep the scrotum immobilized to some degree and keep the swelling down. ° °Ice packs should be placed on and off over the scrotum for the first 48 hours. Frozen peas or corn in a ZipLock bag can be frozen, used and re-frozen. Fifteen minutes  on and 15 minutes off is a reasonable schedule. The ice is a good pain reliever and keeps the swelling down. ° °Hygiene: ° °You may shower 48 hours after your surgery. Tub bathing should be restricted until the seventh day. ° ° ° ° ° ° ° ° ° °Medication: ° °You will be sent home with some type of pain medication. In many cases you will be sent home with a narcotic pain pill (Vicodin or Tylox). If the pain is not too bad, you may take either Tylenol (acetaminophen) or Advil (ibuprofen) which contain no narcotic agents, and might be tolerated a little better, with fewer side effects. If the pain medication you are sent home with does not control the pain, you will have to let us know. Some narcotic pain medications cannot be given or refilled by a phone call to a pharmacy. ° °Problems you should report to us: ° °· Fever of 101.0 degrees Fahrenheit or greater. °· Moderate or severe swelling under the skin incision or involving the scrotum. °· Drug reaction such as hives, a rash, nausea or vomiting ° ° °Post Anesthesia Home Care Instructions ° °Activity: °Get plenty of rest for the remainder of the day. A responsible adult should stay with you for 24 hours following the procedure.  °For the next 24 hours, DO NOT: °-Drive a car °-Operate machinery °-Drink alcoholic beverages °-Take any medication unless instructed by your physician °-Make any legal decisions or sign important papers. ° °Meals: °Start with liquid foods such as gelatin or   greasy, spicy, heavy foods. If nausea and/or vomiting occur, drink only clear liquids until the nausea and/or vomiting subsides. Call your physician if vomiting continues.  Special Instructions/Symptoms: Your throat may feel dry or sore from the anesthesia or the breathing tube placed in your throat during surgery. If this causes discomfort, gargle with warm salt water. The discomfort should disappear within 24 hours.

## 2014-01-05 ENCOUNTER — Encounter (HOSPITAL_BASED_OUTPATIENT_CLINIC_OR_DEPARTMENT_OTHER): Payer: Self-pay | Admitting: Urology

## 2014-01-19 ENCOUNTER — Other Ambulatory Visit: Payer: Self-pay | Admitting: Family Medicine

## 2014-01-19 MED ORDER — FEBUXOSTAT 80 MG PO TABS: 80.0000 mg | ORAL_TABLET | Freq: Every day | ORAL | Status: DC

## 2014-01-19 NOTE — Telephone Encounter (Signed)
Rx Refilled  

## 2014-04-13 ENCOUNTER — Other Ambulatory Visit: Payer: Self-pay | Admitting: Urology

## 2014-04-14 ENCOUNTER — Encounter (HOSPITAL_BASED_OUTPATIENT_CLINIC_OR_DEPARTMENT_OTHER): Payer: Self-pay | Admitting: *Deleted

## 2014-04-14 NOTE — Progress Notes (Signed)
Pt instructed npo p mn 6/4x neurontin, uloric w sip of water.  To [facility] 6/5 @ 0730.  Needs hgb on arrival.

## 2014-04-17 ENCOUNTER — Encounter (HOSPITAL_BASED_OUTPATIENT_CLINIC_OR_DEPARTMENT_OTHER)
Admission: RE | Disposition: A | Discharge status: Home / Self Care | Payer: Self-pay | Source: Ambulatory Visit | Attending: Urology

## 2014-04-17 ENCOUNTER — Ambulatory Visit (HOSPITAL_BASED_OUTPATIENT_CLINIC_OR_DEPARTMENT_OTHER): Payer: BC Managed Care – PPO | Admitting: Anesthesiology

## 2014-04-17 ENCOUNTER — Ambulatory Visit (HOSPITAL_BASED_OUTPATIENT_CLINIC_OR_DEPARTMENT_OTHER)
Admission: RE | Admit: 2014-04-17 | Discharge: 2014-04-17 | Disposition: A | Discharge status: Home / Self Care | Payer: BC Managed Care – PPO | Source: Ambulatory Visit | Attending: Urology | Admitting: Urology

## 2014-04-17 ENCOUNTER — Encounter (HOSPITAL_BASED_OUTPATIENT_CLINIC_OR_DEPARTMENT_OTHER): Payer: Self-pay | Admitting: *Deleted

## 2014-04-17 ENCOUNTER — Encounter (HOSPITAL_BASED_OUTPATIENT_CLINIC_OR_DEPARTMENT_OTHER): Payer: BC Managed Care – PPO | Admitting: Anesthesiology

## 2014-04-17 DIAGNOSIS — N433 Hydrocele, unspecified: Secondary | ICD-10-CM

## 2014-04-17 DIAGNOSIS — Z8042 Family history of malignant neoplasm of prostate: Secondary | ICD-10-CM | POA: Insufficient documentation

## 2014-04-17 DIAGNOSIS — Z888 Allergy status to other drugs, medicaments and biological substances status: Secondary | ICD-10-CM | POA: Insufficient documentation

## 2014-04-17 DIAGNOSIS — M722 Plantar fascial fibromatosis: Secondary | ICD-10-CM | POA: Insufficient documentation

## 2014-04-17 DIAGNOSIS — N529 Male erectile dysfunction, unspecified: Secondary | ICD-10-CM | POA: Insufficient documentation

## 2014-04-17 DIAGNOSIS — M109 Gout, unspecified: Secondary | ICD-10-CM | POA: Insufficient documentation

## 2014-04-17 DIAGNOSIS — Z79899 Other long term (current) drug therapy: Secondary | ICD-10-CM | POA: Insufficient documentation

## 2014-04-17 DIAGNOSIS — Z87891 Personal history of nicotine dependence: Secondary | ICD-10-CM | POA: Insufficient documentation

## 2014-04-17 HISTORY — PX: HYDROCELE EXCISION: SHX482

## 2014-04-17 LAB — POCT HEMOGLOBIN-HEMACUE: Hemoglobin: 15.6 g/dL (ref 13.0–17.0)

## 2014-04-17 SURGERY — HYDROCELECTOMY
Anesthesia: General | Site: Scrotum | Laterality: Left

## 2014-04-17 MED ORDER — CEFAZOLIN SODIUM-DEXTROSE 2-3 GM-% IV SOLR
2.0000 g | INTRAVENOUS | Status: AC
  Administered 2014-04-17: 2 g via INTRAVENOUS
  Filled 2014-04-17: qty 50

## 2014-04-17 MED ORDER — PROPOFOL 10 MG/ML IV BOLUS
INTRAVENOUS | Status: DC | PRN
  Administered 2014-04-17: 200 mg via INTRAVENOUS

## 2014-04-17 MED ORDER — DEXAMETHASONE SODIUM PHOSPHATE 4 MG/ML IJ SOLN
INTRAMUSCULAR | Status: DC | PRN
  Administered 2014-04-17: 10 mg via INTRAVENOUS

## 2014-04-17 MED ORDER — LIDOCAINE HCL (CARDIAC) 20 MG/ML IV SOLN
INTRAVENOUS | Status: DC | PRN
  Administered 2014-04-17: 60 mg via INTRAVENOUS

## 2014-04-17 MED ORDER — PROMETHAZINE HCL 25 MG/ML IJ SOLN
6.2500 mg | INTRAMUSCULAR | Status: DC | PRN
  Filled 2014-04-17: qty 1

## 2014-04-17 MED ORDER — ONDANSETRON HCL 4 MG/2ML IJ SOLN
INTRAMUSCULAR | Status: DC | PRN
  Administered 2014-04-17: 4 mg via INTRAVENOUS

## 2014-04-17 MED ORDER — DOXYCYCLINE HYCLATE 50 MG PO CAPS: 50.0000 mg | ORAL_CAPSULE | Freq: Every day | ORAL | Status: DC

## 2014-04-17 MED ORDER — LACTATED RINGERS IV SOLN
INTRAVENOUS | Status: DC
  Administered 2014-04-17 (×2): via INTRAVENOUS
  Filled 2014-04-17: qty 1000

## 2014-04-17 MED ORDER — TRIPLE ANTIBIOTIC 3.5-400-5000 EX OINT
TOPICAL_OINTMENT | CUTANEOUS | Status: DC | PRN
  Administered 2014-04-17: 1

## 2014-04-17 MED ORDER — BUPIVACAINE-EPINEPHRINE 0.5% -1:200000 IJ SOLN
INTRAMUSCULAR | Status: DC | PRN
  Administered 2014-04-17: 15 mL

## 2014-04-17 MED ORDER — FENTANYL CITRATE 0.05 MG/ML IJ SOLN
INTRAMUSCULAR | Status: AC
  Filled 2014-04-17: qty 4

## 2014-04-17 MED ORDER — MIDAZOLAM HCL 5 MG/5ML IJ SOLN
INTRAMUSCULAR | Status: DC | PRN
  Administered 2014-04-17: 2 mg via INTRAVENOUS

## 2014-04-17 MED ORDER — FENTANYL CITRATE 0.05 MG/ML IJ SOLN
25.0000 ug | INTRAMUSCULAR | Status: DC | PRN
  Filled 2014-04-17: qty 1

## 2014-04-17 MED ORDER — MEPERIDINE HCL 25 MG/ML IJ SOLN
6.2500 mg | INTRAMUSCULAR | Status: DC | PRN
  Filled 2014-04-17: qty 1

## 2014-04-17 MED ORDER — OXYCODONE-ACETAMINOPHEN 10-325 MG PO TABS: 1.0000 | ORAL_TABLET | ORAL | Status: DC | PRN

## 2014-04-17 MED ORDER — FENTANYL CITRATE 0.05 MG/ML IJ SOLN
INTRAMUSCULAR | Status: DC | PRN
  Administered 2014-04-17: 25 ug via INTRAVENOUS
  Administered 2014-04-17: 100 ug via INTRAVENOUS
  Administered 2014-04-17: 25 ug via INTRAVENOUS

## 2014-04-17 MED ORDER — MIDAZOLAM HCL 2 MG/2ML IJ SOLN
INTRAMUSCULAR | Status: AC
  Filled 2014-04-17: qty 2

## 2014-04-17 SURGICAL SUPPLY — 46 items
BANDAGE GAUZE ELAST BULKY 4 IN (GAUZE/BANDAGES/DRESSINGS) ×2 IMPLANT
BLADE SURG 15 STRL LF DISP TIS (BLADE) ×1 IMPLANT
BLADE SURG 15 STRL SS (BLADE) ×1
BLADE SURG ROTATE 9660 (MISCELLANEOUS) ×2 IMPLANT
BNDG GAUZE ELAST 4 BULKY (GAUZE/BANDAGES/DRESSINGS) ×2 IMPLANT
CANISTER SUCTION 1200CC (MISCELLANEOUS) IMPLANT
CANISTER SUCTION 2500CC (MISCELLANEOUS) ×2 IMPLANT
CLEANER CAUTERY TIP 5X5 PAD (MISCELLANEOUS) IMPLANT
CLOTH BEACON ORANGE TIMEOUT ST (SAFETY) ×2 IMPLANT
COVER MAYO STAND STRL (DRAPES) ×2 IMPLANT
COVER TABLE BACK 60X90 (DRAPES) ×2 IMPLANT
DISSECTOR ROUND CHERRY 3/8 STR (MISCELLANEOUS) IMPLANT
DRAIN PENROSE 18X1/4 LTX STRL (WOUND CARE) ×2 IMPLANT
DRAPE PED LAPAROTOMY (DRAPES) ×2 IMPLANT
ELECT REM PT RETURN 9FT ADLT (ELECTROSURGICAL) ×2
ELECTRODE REM PT RTRN 9FT ADLT (ELECTROSURGICAL) ×1 IMPLANT
GAUZE SPONGE 4X4 16PLY XRAY LF (GAUZE/BANDAGES/DRESSINGS) ×2 IMPLANT
GLOVE BIO SURGEON STRL SZ8 (GLOVE) ×2 IMPLANT
GLOVE BIOGEL M 6.5 STRL (GLOVE) ×2 IMPLANT
GLOVE BIOGEL PI IND STRL 6.5 (GLOVE) ×1 IMPLANT
GLOVE BIOGEL PI IND STRL 7.5 (GLOVE) ×1 IMPLANT
GLOVE BIOGEL PI INDICATOR 6.5 (GLOVE) ×1
GLOVE BIOGEL PI INDICATOR 7.5 (GLOVE) ×1
GOWN PREVENTION PLUS LG XLONG (DISPOSABLE) IMPLANT
GOWN STRL REIN XL XLG (GOWN DISPOSABLE) IMPLANT
GOWN STRL REUS W/TWL XL LVL3 (GOWN DISPOSABLE) ×4 IMPLANT
NEEDLE HYPO 25X1 1.5 SAFETY (NEEDLE) ×2 IMPLANT
NS IRRIG 500ML POUR BTL (IV SOLUTION) ×2 IMPLANT
PACK BASIN DAY SURGERY FS (CUSTOM PROCEDURE TRAY) ×2 IMPLANT
PAD CLEANER CAUTERY TIP 5X5 (MISCELLANEOUS)
PENCIL BUTTON HOLSTER BLD 10FT (ELECTRODE) ×2 IMPLANT
SUPPORT SCROTAL LG STRP (MISCELLANEOUS) ×2 IMPLANT
SUT CHROMIC 3 0 SH 27 (SUTURE) ×4 IMPLANT
SUT ETHILON 3 0 PS 1 (SUTURE) ×2 IMPLANT
SUT SILK 4 0 TIES 17X18 (SUTURE) IMPLANT
SUT VIC AB 4-0 RB1 27 (SUTURE)
SUT VIC AB 4-0 RB1 27X BRD (SUTURE) IMPLANT
SUT VICRYL 6 0 RB 1 (SUTURE) IMPLANT
SYR BULB 3OZ (MISCELLANEOUS) ×2 IMPLANT
SYR CONTROL 10ML LL (SYRINGE) IMPLANT
TOWEL OR 17X24 6PK STRL BLUE (TOWEL DISPOSABLE) ×4 IMPLANT
TRAY DSU PREP LF (CUSTOM PROCEDURE TRAY) ×2 IMPLANT
TUBE CONNECTING 12X1/4 (SUCTIONS) ×2 IMPLANT
WATER STERILE IRR 3000ML UROMA (IV SOLUTION) IMPLANT
WATER STERILE IRR 500ML POUR (IV SOLUTION) IMPLANT
YANKAUER SUCT BULB TIP NO VENT (SUCTIONS) ×2 IMPLANT

## 2014-04-17 NOTE — Transfer of Care (Signed)
Immediate Anesthesia Transfer of Care Note  Patient: Joshua Dominguez  Procedure(s) Performed: Procedure(s): LEFT HYDROCELECTOMY ADULT (Left)  Patient Location: PACU  Anesthesia Type:General  Level of Consciousness: sedated  Airway & Oxygen Therapy: Patient Spontanous Breathing and Patient connected to face mask oxygen  Post-op Assessment: Report given to PACU RN and Post -op Vital signs reviewed and stable  Post vital signs: Reviewed and stable  Complications: No apparent anesthesia complications

## 2014-04-17 NOTE — Anesthesia Preprocedure Evaluation (Signed)
Anesthesia Evaluation  Patient identified by MRN, date of birth, ID band Patient awake    Reviewed: Allergy & Precautions, H&P , NPO status , Patient's Chart, lab work & pertinent test results  Airway Mallampati: II TM Distance: >3 FB Neck ROM: full    Dental  (+) Caps, Dental Advisory Given Both upper front are capped:   Pulmonary neg pulmonary ROS, former smoker,  breath sounds clear to auscultation  Pulmonary exam normal       Cardiovascular Exercise Tolerance: Good negative cardio ROS  Rhythm:regular Rate:Normal     Neuro/Psych negative neurological ROS  negative psych ROS   GI/Hepatic negative GI ROS, Neg liver ROS,   Endo/Other  negative endocrine ROS  Renal/GU negative Renal ROS  negative genitourinary   Musculoskeletal   Abdominal   Peds  Hematology negative hematology ROS (+)   Anesthesia Other Findings   Reproductive/Obstetrics negative OB ROS                           Anesthesia Physical  Anesthesia Plan  ASA: II  Anesthesia Plan: General   Post-op Pain Management:    Induction: Intravenous  Airway Management Planned: LMA  Additional Equipment:   Intra-op Plan:   Post-operative Plan:   Informed Consent: I have reviewed the patients History and Physical, chart, labs and discussed the procedure including the risks, benefits and alternatives for the proposed anesthesia with the patient or authorized representative who has indicated his/her understanding and acceptance.   Dental Advisory Given  Plan Discussed with: CRNA and Surgeon  Anesthesia Plan Comments:         Anesthesia Quick Evaluation

## 2014-04-17 NOTE — Op Note (Signed)
PATIENT:  Joshua Dominguez  PRE-OPERATIVE DIAGNOSIS: Left hydrocele, recurrent  POST-OPERATIVE DIAGNOSIS:  Same  PROCEDURE:  Procedure(s): Left hydrocelectomy  SURGEON:  Claybon Jabs  INDICATION: The patient is a 73 year old male who underwent a left hydrocelectomy but had an early recurrence of scrotal swelling. I felt he might have developed a hematocele and his wife confirmed that he did get back to exercising quite heavily soon after his previous surgery. I gave him time to allow the hematocele fluid to resolve but he has a persistent left hemiscrotal swelling and we discussed the treatment options. Elected to proceed with surgical removal of the fluid.  ANESTHESIA:  General  EBL:  Minimal  DRAINS: 1/4 inch Penrose drain  LOCAL MEDICATIONS USED:  15 cc of 1/2% Marcaine with epinephrine  SPECIMEN:  None  DISPOSITION OF SPECIMEN:  N/A  Description of procedure: After informed consent the patient was brought to the major OR, placed on the table and administered general anesthesia. His genitalia was then sterilely prepped and draped. An official timeout was then performed.  A midline median raphae scrotal incision was then made and carried down over the left hydrocele. The tissue over the hydrocele was cleared using a combination of sharp and blunt technique. The hydrocele was then opened, drained of clear dark amber fluid and however do to extensive scarring I could not deliver the entire fluid containing sac through the incision. The excess hydrocele sac tissue was excised with the Bovie electrocautery with the testicle laying in the dependent portion of the scrotum/in situ and then I cauterized the edges. The scrotum was irrigated with saline. Bleeding points were cauterized.  I then used a hemostat and passed this through the incision into the dependent portion of the left hemiscrotum and incised the skin over the hemostat. I grasped the Penrose drain and withdrew this into the left  hemiscrotum and secured the Penrose drain to the skin with a 3-0 nylon suture.  His testicle appeared normal and viable within the left hemiscrotum. I then closed the deep scrotal tissue with running 3-0 chromic suture in a locking fashion. I injected half percent Marcaine with epinephrine in the subcutaneous tissue and closed the skin with running 3-0 chromic. Neosporin, a sterile gauze dressing, fluff Kerlix and a scrotal support were applied. The patient tolerated the procedure well no intraoperative complications. Needle sponge and instrument counts were correct at the end of the operation.   PLAN OF CARE: Discharge to home after PACU  PATIENT DISPOSITION:  PACU - hemodynamically stable.

## 2014-04-17 NOTE — Anesthesia Postprocedure Evaluation (Signed)
  Anesthesia Post-op Note  Patient: Joshua Dominguez  Procedure(s) Performed: Procedure(s) (LRB): LEFT HYDROCELECTOMY ADULT (Left)  Patient Location: PACU  Anesthesia Type: General  Level of Consciousness: awake and alert   Airway and Oxygen Therapy: Patient Spontanous Breathing  Post-op Pain: mild  Post-op Assessment: Post-op Vital signs reviewed, Patient's Cardiovascular Status Stable, Respiratory Function Stable, Patent Airway and No signs of Nausea or vomiting  Last Vitals:  Filed Vitals:   04/17/14 1124  BP: 152/93  Pulse:   Temp: 36.2 C  Resp: 18    Post-op Vital Signs: stable   Complications: No apparent anesthesia complications

## 2014-04-17 NOTE — Discharge Instructions (Signed)
Scrotal surgery postoperative instructions ° °Wound: ° °In most cases your incision will have absorbable sutures that will dissolve within the first 10-20 days. Some will fall out even earlier. Expect some redness as the sutures dissolved but this should occur only around the sutures. If there is generalized redness, especially with increasing pain or swelling, let us know. The scrotum will very likely get "black and blue" as the blood in the tissues spread. Sometimes the whole scrotum will turn colors. The black and blue is followed by a yellow and brown color. In time, all the discoloration will go away. In some cases some firm swelling in the area of the testicle may persist for up to 4-6 weeks after the surgery and is considered normal in most cases. ° °Diet: ° °You may return to your normal diet within 24 hours following your surgery. You may note some mild nausea and possibly vomiting the first 6-8 hours following surgery. This is usually due to the side effects of anesthesia, and will disappear quite soon. I would suggest clear liquids and a very light meal the first evening following your surgery. ° °Activity: ° °Your physical activity should be restricted the first 48 hours. During that time you should remain relatively inactive, moving about only when necessary. During the first 7-10 days following surgery he should avoid lifting any heavy objects (anything greater than 15 pounds), and avoid strenuous exercise. If you work, ask us specifically about your restrictions, both for work and home. We will write a note to your employer if needed. ° °You should plan to wear a tight pair of jockey shorts or an athletic supporter for the first 4-5 days, even to sleep. This will keep the scrotum immobilized to some degree and keep the swelling down. ° °Ice packs should be placed on and off over the scrotum for the first 48 hours. Frozen peas or corn in a ZipLock bag can be frozen, used and re-frozen. Fifteen minutes  on and 15 minutes off is a reasonable schedule. The ice is a good pain reliever and keeps the swelling down. ° °Hygiene: ° °You may shower 48 hours after your surgery. Tub bathing should be restricted until the seventh day. ° ° ° ° ° ° ° ° ° °Medication: ° °You will be sent home with some type of pain medication. In many cases you will be sent home with a narcotic pain pill (hydrococone or oxycodone). If the pain is not too bad, you may take either Tylenol (acetaminophen) or Advil (ibuprofen) which contain no narcotic agents, and might be tolerated a little better, with fewer side effects. If the pain medication you are sent home with does not control the pain, you will have to let us know. Some narcotic pain medications cannot be given or refilled by a phone call to a pharmacy. ° °Problems you should report to us: ° °· Fever of 101.0 degrees Fahrenheit or greater. °· Moderate or severe swelling under the skin incision or involving the scrotum. °· Drug reaction such as hives, a rash, nausea or vomiting. °·  ° ° °Post Anesthesia Home Care Instructions ° °Activity: °Get plenty of rest for the remainder of the day. A responsible adult should stay with you for 24 hours following the procedure.  °For the next 24 hours, DO NOT: °-Drive a car °-Operate machinery °-Drink alcoholic beverages °-Take any medication unless instructed by your physician °-Make any legal decisions or sign important papers. ° °Meals: °Start with liquid foods such as   tolerated. Avoid greasy, spicy, heavy foods. If nausea and/or vomiting occur, drink only clear liquids until the nausea and/or vomiting subsides. Call your physician if vomiting continues. ° °Special Instructions/Symptoms: °Your throat may feel dry or sore from the anesthesia or the breathing tube placed in your throat during surgery. If this causes discomfort, gargle with warm salt water. The discomfort should disappear within 24 hours. ° °

## 2014-04-17 NOTE — H&P (Signed)
Joshua Dominguez is a 73 year-old male with persistent left hemiscrotal swelling after hydrocelectomy.   History of Present Illness    Prostate cancer: His PSA in 1/13 was 3.67 in 1/14 it was noted to be 4.53/11%. He reports that he was told by Dr. Dennard Schaumann that there was an area that felt abnormal on DRE. He also indicates that a repeat PSA was done and he believes was 4.62. He has, as a risk factor for prostate cancer, father who had prostate cancer diagnosed at the age of 42 and had his prostate removed.  On DRE I noted a ridge palpable in the lateral aspect of the prostate on the right-hand side.  TRUS/Bx 01/30/13 - Prostate vol. - 36cc  Pathology: Gleason 3+3=6 in 7/12 cores.  Treatment: I-125 seeds 05/09/13        Prostatitis: He had seen Dr. Amalia Hailey for this in the distant past. His symptoms consist primarily of perineal pain. I saw him in 3/10 for symptoms consistent with prostatitis and treated him with an alpha-blocker and antibiotic therapy.     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    LUTS: After undergoing radiation he developed some mild urgency, frequency and increased nocturia that was associated with a slight decrease in force of his urinary stream.  Alpha-blocker therapy did not result in improvement.    Interval history: He reported that after his surgery that the right side had completely resolved but left side remained enlarged although less than it was previously. He was not having any pain in that area.    Past Medical History Problems  1. History of Gout (274.00) 2. 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 History of Plantar fasciitis (728.71)  Surgical History Problems  1. History of Appendectomy 2. History of Biopsy Of The Prostate Needle 3. History of Surgery Excision Of Spermatocele With Epididymectomy Right 4. History of Surgery Prostate Transperineal Placement Of Needles 5.  History of Surgery Tunica Vaginalis Excision Of Hydrocele Left  Current Meds 1. Amitriptyline HCl - 25 MG Oral Tablet;  Therapy: [id number] to Recorded 2. Fish Oil CAPS;  Therapy: (Recorded:21Jan2015) to Recorded 3. Gabapentin 300 MG Oral Capsule;  Therapy: [id number] to Recorded 4. Glucosamine Chondroitin Joint TABS;  Therapy: (Recorded:21Jan2015) to Recorded 5. Multi-Vitamin TABS;  Therapy: (Recorded:18Jul2014) to Recorded 6. Oxycodone-Acetaminophen 10-325 MG Oral Tablet;  Therapy: 20Feb2015 to Recorded 7. Stendra 100 MG Oral Tablet; Take 1 tablet as needed;  Therapy: 07Oct2014 to (Last Rx:07Oct2014) Ordered 8. Uloric 80 MG Oral Tablet;  Therapy: 08Sep2014 to Recorded 9. Vitamin C TABS;  Therapy: (Recorded:21Jan2015) to Recorded 10. Vitamin D-3 TABS;   Therapy: (Recorded:21Jan2015) to Recorded  Allergies Medication  1. Afrin Allergy SOLN 2. CeleBREX CAPS  Family History Problems  1. Family history of Death In The Family Father : Father   age 24; hiking accident 2. Family history of Death In The Family Mother : Mother   age 34; lung cancer 3. Family history of Family Health Status Number Of Children   2 sons 4. Family history of Prostate Cancer (B35.32) : Father  Social History Problems  1. Alcohol Use   2 a day 2. Caffeine Use   1 a day 3. Former smoker (V15.82) 4. Marital History - Currently Married 5. Occupation:   professor 6. Tobacco Use (V15.82)   1pk a day for 10 yrs quit 32 yrs ago  Review of Systems Genitourinary, constitutional, skin, eye, otolaryngeal, hematologic/lymphatic, cardiovascular, pulmonary, endocrine,  musculoskeletal, gastrointestinal, neurological and psychiatric system(s) were reviewed and pertinent findings if present are noted.  Genitourinary: nocturia.    Vitals Blood Pressure: 128 / 79 Heart Rate: 68  Physical Exam Constitutional: Well nourished and well developed. No acute distress.  ENT: The ears and nose are normal in  appearance.  Neck: The appearance of the neck is normal and no neck mass is present.  Pulmonary: No respiratory distress and normal respiratory rhythm and effort.  Cardiovascular: Heart rate and rhythm are normal. No peripheral edema.  Abdomen: The abdomen is soft and nontender. No masses are palpated. No CVA tenderness. No hernias are palpable. No hepatosplenomegaly noted.  Genitourinary: Examination of the penis demonstrates no discharge, no masses, no lesions and a normal meatus. The scrotum is without lesions. The right epididymis is palpably normal and non-tender.  There is a left hemiscrotal swelling making palpation of the testicle and epididymis difficult.  The right testis is non-tender and without masses. Rectal exam demonstrates an internal hemorrhoid, but normal sphincter tone, no tenderness and no masses. Estimated prostate size is 2+. The prostate has no nodularity and is not tender. The left seminal vesicle is nonpalpable. The right seminal vesicle is nonpalpable. The perineum is normal on inspection.  Lymphatics: The femoral and inguinal nodes are not enlarged or tender.  Skin: Normal skin turgor, no visible rash and no visible skin lesions.  Neuro/Psych: Mood and affect are appropriate.   The following images/tracing/specimen were independently visualized:  Scrotal ultrasound done on 02/26/14 revealed the right testicle measured 3.7 x 1.5 x 4.1 cm and the left testicle measured 3.9 x 2.9 x 4.6 cm. Both testicles appeared to have normal internal echo pattern. Bilateral epididymal cysts of no clinical significance were noted. Anechoic region surrounding the left testicle was noted with some septa consistent with recurrent hydrocele.  The following clinical lab reports were reviewed:  UA on 02/26/14 was clear.    Assessment I was concerned about a hematocele. This may have occurred and then reabsorbed. He does have a hydrocele on the left-hand side with some septa but no evidence of  infection. I discussed the options which would be continued observation to see if it continued to resolve and he indicated to me that he felt has decreased somewhat over time. I discussed the surgical procedure that would be required to re-drain the hydrocele and this time I also described the placement of a postoperative drain to prevent it from recurring.  We again went over the risks and complications as well as the probability of success in the postoperative course anticipated.  He understands and is elected to proceed.  Plan: Left hydrocelectomy with postoperative Penrose drain.

## 2014-04-17 NOTE — Anesthesia Procedure Notes (Signed)
Procedure Name: LMA Insertion Date/Time: 04/17/2014 8:38 AM Performed by: Maryella Shivers Pre-anesthesia Checklist: Patient identified, Emergency Drugs available, Suction available and Patient being monitored Patient Re-evaluated:Patient Re-evaluated prior to inductionOxygen Delivery Method: Circle System Utilized Preoxygenation: Pre-oxygenation with 100% oxygen Intubation Type: IV induction Ventilation: Mask ventilation without difficulty LMA: LMA inserted LMA Size: 5.0 Number of attempts: 1 Airway Equipment and Method: bite block Placement Confirmation: positive ETCO2 Tube secured with: Tape Dental Injury: Teeth and Oropharynx as per pre-operative assessment

## 2014-04-22 ENCOUNTER — Encounter (HOSPITAL_BASED_OUTPATIENT_CLINIC_OR_DEPARTMENT_OTHER): Payer: Self-pay | Admitting: Urology

## 2014-06-25 ENCOUNTER — Encounter: Payer: Self-pay | Admitting: Family Medicine

## 2014-06-25 ENCOUNTER — Ambulatory Visit (INDEPENDENT_AMBULATORY_CARE_PROVIDER_SITE_OTHER): Payer: BC Managed Care – PPO | Admitting: Family Medicine

## 2014-06-25 VITALS — BP 128/70 | HR 78 | Temp 98.1°F | Resp 14 | Ht 77.0 in | Wt 267.0 lb

## 2014-06-25 DIAGNOSIS — G629 Polyneuropathy, unspecified: Secondary | ICD-10-CM

## 2014-06-25 DIAGNOSIS — G589 Mononeuropathy, unspecified: Secondary | ICD-10-CM

## 2014-06-25 DIAGNOSIS — M109 Gout, unspecified: Secondary | ICD-10-CM

## 2014-06-25 LAB — URIC ACID: Uric Acid, Serum: 3 mg/dL — ABNORMAL LOW (ref 4.0–7.8)

## 2014-06-25 MED ORDER — AMITRIPTYLINE HCL 50 MG PO TABS: 50.0000 mg | ORAL_TABLET | Freq: Every day | ORAL | Status: DC

## 2014-06-25 MED ORDER — GABAPENTIN 300 MG PO CAPS: 300.0000 mg | ORAL_CAPSULE | Freq: Two times a day (BID) | ORAL | Status: DC

## 2014-06-25 NOTE — Progress Notes (Signed)
Subjective:    Patient ID: Joshua Dominguez, male    DOB: Oct 05, 1941, 73 y.o.   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 He describes the pain as burning, pins and needles, stinging sensation on the plantar aspects of both feet. He is currently managing his pain well with gabapentin and amitriptyline.  The amitriptyline he only takes at night to help him sleep better. It does seem to work well. He denies any dizziness, confusion, sleepiness, urinary retention, or constipation. He also has a history of chronic gout. He is overdue for a uric acid level. He is currently taking uloric 80 poqday. Past Medical History  Diagnosis Date  . Gout   . Allergic rhinitis   . Hemorrhoids   . DDD (degenerative disc disease), lumbar      L3-L4  . Lumbar herniated disc 2007    L4 and L5 did not require surgery  . History of prostatitis   . ED (erectile dysfunction) of organic origin   . Prostate cancer     S/P  RADIOACTIVE SEED IMPLANTS 05-09-2013  . Bilateral hydrocele    Current Outpatient Prescriptions on File Prior to Visit  Medication Sig Dispense Refill  . Cholecalciferol (VITAMIN D3) 1000 UNITS CAPS Take 1 capsule by mouth daily.      . colchicine 0.6 MG tablet Take 0.6 mg by mouth daily as needed.      . Febuxostat (ULORIC) 80 MG TABS Take 1 tablet (80 mg total) by mouth daily.  30 tablet  11  . glucosamine-chondroitin 500-400 MG tablet Take 1 tablet by mouth 2 (two) times daily.       Marland Kitchen ibuprofen (ADVIL,MOTRIN) 200 MG tablet Take 200 mg by mouth every 6 (six) hours as needed for pain.      . Multiple Vitamin (MULTIVITAMIN) tablet Take 1 tablet by mouth daily.       . Omega-3 Fatty Acids (FISH OIL) 1200 MG CAPS Take 1 capsule by mouth 2 (two) times daily.       . Saw Palmetto, Serenoa repens, (SAW PALMETTO PO) Take 2 capsules by mouth daily.        No current facility-administered medications on file prior to visit.   Allergies  Allergen Reactions    . Celebrex [Celecoxib] Rash  . Oxymetazoline Hcl Rash   History   Social History  . Marital Status: Married    Spouse Name: N/A    Number of Children: N/A  . Years of Education: N/A   Occupational History  . Consulting/professor    Social History Main Topics  . Smoking status: Former Smoker    Types: Pipe    Quit date: 11/13/1976  . Smokeless tobacco: Never Used  . Alcohol Use: 8.4 oz/week    14 Glasses of wine per week  . Drug Use: No  . Sexual Activity: Not on file   Other Topics Concern  . 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      Objective:   Physical Exam  Vitals reviewed. Cardiovascular: Normal rate and regular rhythm.   Pulmonary/Chest: Effort normal and breath sounds normal.  Abdominal: Soft. Bowel sounds are normal.  Musculoskeletal: Normal range of motion. He exhibits no edema and no tenderness.          Assessment & Plan:  1. Neuropathy - amitriptyline (ELAVIL) 50 MG tablet; Take 1 tablet (50 mg total) by mouth at bedtime.  Dispense: 90 tablet; Refill: 3 - gabapentin (NEURONTIN) 300 MG capsule; Take 1 capsule (300 mg total) by mouth 2 (two) times daily. 3 TABS QAM  /   2 TABS  QPM  Dispense: 450 capsule; Refill: 3  2. Gout of big toe Check uric acid level. Goal uric acid level is less than 6. - Uric Acid

## 2014-06-26 ENCOUNTER — Encounter: Payer: Self-pay | Admitting: *Deleted

## 2014-08-07 ENCOUNTER — Encounter: Payer: Self-pay | Admitting: Gastroenterology

## 2014-12-31 ENCOUNTER — Other Ambulatory Visit: Payer: BC Managed Care – PPO

## 2014-12-31 DIAGNOSIS — E790 Hyperuricemia without signs of inflammatory arthritis and tophaceous disease: Secondary | ICD-10-CM

## 2014-12-31 DIAGNOSIS — Z125 Encounter for screening for malignant neoplasm of prostate: Secondary | ICD-10-CM

## 2014-12-31 DIAGNOSIS — Z79899 Other long term (current) drug therapy: Secondary | ICD-10-CM

## 2014-12-31 DIAGNOSIS — E785 Hyperlipidemia, unspecified: Secondary | ICD-10-CM

## 2014-12-31 DIAGNOSIS — R030 Elevated blood-pressure reading, without diagnosis of hypertension: Secondary | ICD-10-CM

## 2014-12-31 DIAGNOSIS — Z0000 Encounter for general adult medical examination without abnormal findings: Secondary | ICD-10-CM

## 2014-12-31 LAB — CBC WITH DIFFERENTIAL/PLATELET
Basophils Absolute: 0 10*3/uL (ref 0.0–0.1)
Basophils Relative: 0 % (ref 0–1)
EOS ABS: 0.1 10*3/uL (ref 0.0–0.7)
Eosinophils Relative: 2 % (ref 0–5)
HCT: 45.8 % (ref 39.0–52.0)
Hemoglobin: 15.6 g/dL (ref 13.0–17.0)
Lymphocytes Relative: 34 % (ref 12–46)
Lymphs Abs: 2 10*3/uL (ref 0.7–4.0)
MCH: 31.7 pg (ref 26.0–34.0)
MCHC: 34.1 g/dL (ref 30.0–36.0)
MCV: 93.1 fL (ref 78.0–100.0)
MONO ABS: 0.5 10*3/uL (ref 0.1–1.0)
MONOS PCT: 9 % (ref 3–12)
MPV: 9.1 fL (ref 8.6–12.4)
NEUTROS ABS: 3.2 10*3/uL (ref 1.7–7.7)
Neutrophils Relative %: 55 % (ref 43–77)
Platelets: 173 10*3/uL (ref 150–400)
RBC: 4.92 MIL/uL (ref 4.22–5.81)
RDW: 13.6 % (ref 11.5–15.5)
WBC: 5.8 10*3/uL (ref 4.0–10.5)

## 2014-12-31 LAB — COMPLETE METABOLIC PANEL WITH GFR
ALBUMIN: 4 g/dL (ref 3.5–5.2)
ALT: 25 U/L (ref 0–53)
AST: 27 U/L (ref 0–37)
Alkaline Phosphatase: 63 U/L (ref 39–117)
BUN: 26 mg/dL — AB (ref 6–23)
CALCIUM: 8.8 mg/dL (ref 8.4–10.5)
CO2: 23 meq/L (ref 19–32)
Chloride: 107 mEq/L (ref 96–112)
Creat: 1.2 mg/dL (ref 0.50–1.35)
GFR, Est African American: 70 mL/min
GFR, Est Non African American: 60 mL/min
GLUCOSE: 82 mg/dL (ref 70–99)
POTASSIUM: 4.2 meq/L (ref 3.5–5.3)
SODIUM: 141 meq/L (ref 135–145)
Total Bilirubin: 0.6 mg/dL (ref 0.2–1.2)
Total Protein: 6.3 g/dL (ref 6.0–8.3)

## 2014-12-31 LAB — TSH: TSH: 3.392 u[IU]/mL (ref 0.350–4.500)

## 2014-12-31 LAB — LIPID PANEL
CHOL/HDL RATIO: 5.8 ratio
CHOLESTEROL: 191 mg/dL (ref 0–200)
HDL: 33 mg/dL — ABNORMAL LOW (ref >39)
LDL Cholesterol: 117 mg/dL — ABNORMAL HIGH (ref 0–99)
TRIGLYCERIDES: 204 mg/dL — AB (ref <150)
VLDL: 41 mg/dL — ABNORMAL HIGH (ref 0–40)

## 2015-01-01 LAB — PSA, MEDICARE: PSA: 0.31 ng/mL (ref <=4.00)

## 2015-01-12 ENCOUNTER — Encounter: Payer: BC Managed Care – PPO | Admitting: Family Medicine

## 2015-01-13 ENCOUNTER — Telehealth: Payer: Self-pay | Admitting: Family Medicine

## 2015-01-13 ENCOUNTER — Encounter: Payer: Self-pay | Admitting: Family Medicine

## 2015-01-13 MED ORDER — FEBUXOSTAT 80 MG PO TABS: 80.0000 mg | ORAL_TABLET | Freq: Every day | ORAL | Status: DC

## 2015-01-13 NOTE — Telephone Encounter (Signed)
Medication refill for one time only.  Patient needs to be seen.  Letter sent for patient to call and schedule 

## 2015-01-22 ENCOUNTER — Ambulatory Visit (INDEPENDENT_AMBULATORY_CARE_PROVIDER_SITE_OTHER): Payer: BC Managed Care – PPO | Admitting: Family Medicine

## 2015-01-22 ENCOUNTER — Encounter: Payer: Self-pay | Admitting: Family Medicine

## 2015-01-22 VITALS — BP 126/80 | HR 80 | Temp 97.4°F | Resp 18 | Ht 77.0 in | Wt 272.0 lb

## 2015-01-22 DIAGNOSIS — Z0000 Encounter for general adult medical examination without abnormal findings: Secondary | ICD-10-CM | POA: Diagnosis not present

## 2015-01-22 DIAGNOSIS — D485 Neoplasm of uncertain behavior of skin: Secondary | ICD-10-CM | POA: Diagnosis not present

## 2015-01-22 DIAGNOSIS — M25571 Pain in right ankle and joints of right foot: Secondary | ICD-10-CM

## 2015-01-22 NOTE — Progress Notes (Signed)
Subjective:    Patient ID: Joshua Dominguez, male    DOB: 30-Jun-1941, 74 y.o.   MRN: [id number]  HPI  Patient is here today for complete physical exam. He also has 3 other concerns. There is a 1.5 x 1 cm erythematous scaly macule on the bridge of his nose. There are surrounding telangiectasias. It does appear to be a possible precancerous lesion. It has been there for several months. Patient also complains of pain in his left foot. It is located at the fourth MTP joint. In response to colchicine. If it as he takes the colchicine the pain resolves. However his last uric acid level was 3.0 on Uloric.  Patient has not had any x-rays of this area. Furthermore he complains of severe pain every night. The pain is burning in nature. It is located on the plantar aspects of both feet. It is usually at 8 on a scale of 1-10. Gabapentin does it to a 6 on a scale 1-10. He denies any numbness. It is actually better when he is running. It is better first thing in the morning which makes them suspect that it is not plantar fasciitis. He believes it is more likely neuropathy. Past Medical History  Diagnosis Date  . Gout   . Allergic rhinitis   . Hemorrhoids   . DDD (degenerative disc disease), lumbar      L3-L4  . Lumbar herniated disc 2007    L4 and L5 did not require surgery  . History of prostatitis   . ED (erectile dysfunction) of organic origin   . Prostate cancer     S/P  RADIOACTIVE SEED IMPLANTS 05-09-2013  . Bilateral hydrocele    Past Surgical History  Procedure Laterality Date  . Knee arthroscopy Left   . Appendectomy  FEB 2000  . Cataract extraction w/ intraocular lens  implant, bilateral    . Radioactive seed implant N/A 05/09/2013    Procedure: RADIOACTIVE SEED IMPLANT;  Surgeon: Claybon Jabs, MD;  Location[facility];  Service: Urology;  Laterality: N/A;  . Hydrocele excision Bilateral 01/02/2014    Procedure: BILATERAL HYDROCELECTOMY;  Surgeon: Claybon Jabs, MD;   Location: [facility];  Service: Urology;  Laterality: Bilateral;  . Hydrocele excision Left 04/17/2014    Procedure: LEFT HYDROCELECTOMY ADULT;  Surgeon: Claybon Jabs, MD;  Location: [facility];  Service: Urology;  Laterality: Left;   Current Outpatient Prescriptions on File Prior to Visit  Medication Sig Dispense Refill  . amitriptyline (ELAVIL) 50 MG tablet Take 1 tablet (50 mg total) by mouth at bedtime. 90 tablet 3  . Cholecalciferol (VITAMIN D3) 1000 UNITS CAPS Take 1 capsule by mouth daily.    . Febuxostat (ULORIC) 80 MG TABS Take 1 tablet (80 mg total) by mouth daily. 30 tablet 0  . gabapentin (NEURONTIN) 300 MG capsule Take 1 capsule (300 mg total) by mouth 2 (two) times daily. 3 TABS QAM  /   2 TABS  QPM 450 capsule 3  . glucosamine-chondroitin 500-400 MG tablet Take 1 tablet by mouth 2 (two) times daily.     Marland Kitchen ibuprofen (ADVIL,MOTRIN) 200 MG tablet Take 200 mg by mouth every 6 (six) hours as needed for pain.    . Multiple Vitamin (MULTIVITAMIN) tablet Take 1 tablet by mouth daily.     . Omega-3 Fatty Acids (FISH OIL) 1200 MG CAPS Take 1 capsule by mouth 2 (two) times daily.      No current  facility-administered medications on file prior to visit.   Allergies  Allergen Reactions  . Celebrex [Celecoxib] Rash  . Oxymetazoline Hcl Rash   History   Social History  . Marital Status: Married    Spouse Name: N/A  . Number of Children: N/A  . Years of Education: N/A   Occupational History  . Consulting/professor    Social History Main Topics  . Smoking status: Former Smoker    Types: Pipe    Quit date: 11/13/1976  . Smokeless tobacco: Never Used  . Alcohol Use: 8.4 oz/week    14 Glasses of wine per week  . Drug Use: No  . Sexual Activity: Not on file   Other Topics Concern  . 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 Hypertension Father   . Gout Father   . Prostate cancer Father   . Lung cancer Mother     +smoker  . Lung cancer Maternal Grandfather     -smoker  . Heart disease Sister   . Heart disease Brother      Review of Systems  All other systems reviewed and are negative.      Objective:   Physical Exam  Constitutional: He is oriented to person, place, and time. He appears well-developed and well-nourished. No distress.  HENT:  Head: Normocephalic and atraumatic.  Right Ear: External ear normal.  Left Ear: External ear normal.  Nose: Nose normal.  Mouth/Throat: Oropharynx is clear and moist. No oropharyngeal exudate.  Eyes: Conjunctivae and EOM are normal. Pupils are equal, round, and reactive to light. Right eye exhibits no discharge. Left eye exhibits no discharge. No scleral icterus.  Neck: Normal range of motion. Neck supple. No JVD present. No tracheal deviation present. No thyromegaly present.  Cardiovascular: Normal rate, regular rhythm, normal heart sounds and intact distal pulses.  Exam reveals no gallop and no friction rub.   No murmur heard. Pulmonary/Chest: Effort normal and breath sounds normal. No stridor. No respiratory distress. He has no wheezes. He has no rales. He exhibits no tenderness.  Abdominal: Soft. Bowel sounds are normal. He exhibits no distension and no mass. There is no tenderness. There is no rebound and no guarding.  Musculoskeletal: Normal range of motion. He exhibits no edema or tenderness.  Lymphadenopathy:    He has no cervical adenopathy.  Neurological: He is alert and oriented to person, place, and time. He has normal reflexes. He displays normal reflexes. No cranial nerve deficit. He exhibits normal muscle tone. Coordination normal.  Skin: No rash noted. He is not diaphoretic.  No erythema. No pallor.  Psychiatric: He has a normal mood and affect. His behavior is normal. Judgment and thought content normal.  Vitals reviewed.         Assessment & Plan:  Pain in joint, ankle and foot, right - Plan: DG Foot Complete Right  Routine general medical examination at a health care facility  Neoplasm of uncertain behavior of skin  Patient's physical exam is completely normal. His most recent lab work as listed below: Lab on 12/31/2014  Component Date Value Ref Range Status  . Sodium 12/31/2014 141  135 - 145 mEq/L Final  .  Potassium 12/31/2014 4.2  3.5 - 5.3 mEq/L Final  . Chloride 12/31/2014 107  96 - 112 mEq/L Final  . CO2 12/31/2014 23  19 - 32 mEq/L Final  . Glucose, Bld 12/31/2014 82  70 - 99 mg/dL Final  . BUN 12/31/2014 26* 6 - 23 mg/dL Final  . Creat 12/31/2014 1.20  0.50 - 1.35 mg/dL Final  . Total Bilirubin 12/31/2014 0.6  0.2 - 1.2 mg/dL Final  . Alkaline Phosphatase 12/31/2014 63  39 - 117 U/L Final  . AST 12/31/2014 27  0 - 37 U/L Final  . ALT 12/31/2014 25  0 - 53 U/L Final  . Total Protein 12/31/2014 6.3  6.0 - 8.3 g/dL Final  . Albumin 12/31/2014 4.0  3.5 - 5.2 g/dL Final  . Calcium 12/31/2014 8.8  8.4 - 10.5 mg/dL Final  . GFR, Est African American 12/31/2014 70   Final  . GFR, Est Non African American 12/31/2014 60   Final   Comment:   The estimated GFR is a calculation valid for adults (>=82 years old) that uses the CKD-EPI algorithm to adjust for age and sex. It is   not to be used for children, pregnant women, hospitalized patients,    patients on dialysis, or with rapidly changing kidney function. According to the NKDEP, eGFR >89 is normal, 60-89 shows mild impairment, 30-59 shows moderate impairment, 15-29 shows severe impairment and <15 is ESRD.     Marland Kitchen Cholesterol 12/31/2014 191  0 - 200 mg/dL Final   Comment: ATP III Classification:       < 200        mg/dL        Desirable      200 - 239     mg/dL        Borderline High       >= 240        mg/dL        High     . Triglycerides 12/31/2014 204* <150 mg/dL Final  . HDL 12/31/2014 33* >39 mg/dL Final  . Total CHOL/HDL Ratio 12/31/2014 5.8   Final  . VLDL 12/31/2014 41* 0 - 40 mg/dL Final  . LDL Cholesterol 12/31/2014 117* 0 - 99 mg/dL Final   Comment:   Total Cholesterol/HDL Ratio:CHD Risk                        Coronary Heart Disease Risk Table                                        Men       Women          1/2 Average Risk              3.4        3.3              Average Risk              5.0        4.4           2X Average Risk              9.6        7.1           3X Average Risk  23.4       11.0 Use the calculated Patient Ratio above and the CHD Risk table  to determine the patient's CHD Risk. ATP III Classification (LDL):       < 100        mg/dL         Optimal      100 - 129     mg/dL         Near or Above Optimal      130 - 159     mg/dL         Borderline High      160 - 189     mg/dL         High       > 190        mg/dL         Very High     . WBC 12/31/2014 5.8  4.0 - 10.5 K/uL Final  . RBC 12/31/2014 4.92  4.22 - 5.81 MIL/uL Final  . Hemoglobin 12/31/2014 15.6  13.0 - 17.0 g/dL Final  . HCT 12/31/2014 45.8  39.0 - 52.0 % Final  . MCV 12/31/2014 93.1  78.0 - 100.0 fL Final  . MCH 12/31/2014 31.7  26.0 - 34.0 pg Final  . MCHC 12/31/2014 34.1  30.0 - 36.0 g/dL Final  . RDW 12/31/2014 13.6  11.5 - 15.5 % Final  . Platelets 12/31/2014 173  150 - 400 K/uL Final  . MPV 12/31/2014 9.1  8.6 - 12.4 fL Final  . Neutrophils Relative % 12/31/2014 55  43 - 77 % Final  . Neutro Abs 12/31/2014 3.2  1.7 - 7.7 K/uL Final  . Lymphocytes Relative 12/31/2014 34  12 - 46 % Final  . Lymphs Abs 12/31/2014 2.0  0.7 - 4.0 K/uL Final  . Monocytes Relative 12/31/2014 9  3 - 12 % Final  . Monocytes Absolute 12/31/2014 0.5  0.1 - 1.0 K/uL Final  . Eosinophils Relative 12/31/2014 2  0 - 5 % Final  . Eosinophils Absolute 12/31/2014 0.1  0.0 - 0.7 K/uL  Final  . Basophils Relative 12/31/2014 0  0 - 1 % Final  . Basophils Absolute 12/31/2014 0.0  0.0 - 0.1 K/uL Final  . Smear Review 12/31/2014 Criteria for review not met   Final  . TSH 12/31/2014 3.392  0.350 - 4.500 uIU/mL Final  . PSA 12/31/2014 0.31  <=4.00 ng/mL Final   Comment: Test Methodology: ECLIA PSA (Electrochemiluminescence Immunoassay)   For PSA values from 2.5-4.0, particularly in younger men <68 years old, the AUA and NCCN suggest testing for % Free PSA (3515) and evaluation of the rate of increase in PSA (PSA velocity).    . His colonoscopy was in 2010 and is up-to-date. His PSA was better than previously checked. He follows up regularly with his urologist. Prevnar 13, Pneumovax 23, his shingles vaccine, and his tetanus shot are all up-to-date. 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 He was treated with liquid nitrogen cryotherapy for a total of 30 seconds. If the lesion persist I recommend a skin biopsy. I believe the pain in his foot is likely gout. Therefore I recommended that he take one colchicine every night in addition to the lower for the pain in his fifth MTP joint. I will obtain an x-ray to rule out any other skeletal pathology. I also believe that the pain he is having at night is neuropathy in his feet rather than  plantar fasciitis I suggested switching the patient from gabapentin to Lyrica to see if the pain would improve. Patient will consider. I will give him cards for Lyrica 75 mg by mouth twice a day for one week to see if it helps. If he takes Lyrica he needs to discontinue the gabapentin.

## 2015-02-12 ENCOUNTER — Telehealth: Payer: Self-pay | Admitting: Family Medicine

## 2015-02-12 MED ORDER — COLCHICINE 0.6 MG PO TABS: ORAL_TABLET | ORAL | Status: DC

## 2015-02-12 NOTE — Telephone Encounter (Signed)
Colchicine 1.2 mg pox 1 then 0.6 mg po in 1 hour if no better. (10)

## 2015-02-12 NOTE — Telephone Encounter (Signed)
Left patient voice message with provider recommendations.  Rx to pharmacy

## 2015-02-12 NOTE — Telephone Encounter (Signed)
PT is needing a refill on culrco?? Didn't see it on med list he was prescribed it back in 2014 for gout and he is having a flare up and would like to have refill  [facility]  [phone]

## 2015-02-17 ENCOUNTER — Ambulatory Visit
Admission: RE | Admit: 2015-02-17 | Discharge: 2015-02-17 | Disposition: A | Discharge status: Home / Self Care | Payer: BC Managed Care – PPO | Source: Ambulatory Visit | Attending: Family Medicine | Admitting: Family Medicine

## 2015-02-17 DIAGNOSIS — M25571 Pain in right ankle and joints of right foot: Secondary | ICD-10-CM

## 2015-02-19 ENCOUNTER — Encounter: Payer: Self-pay | Admitting: Family Medicine

## 2015-03-09 ENCOUNTER — Telehealth: Payer: Self-pay | Admitting: Family Medicine

## 2015-03-09 MED ORDER — FEBUXOSTAT 80 MG PO TABS: 80.0000 mg | ORAL_TABLET | Freq: Every day | ORAL | Status: DC

## 2015-03-09 NOTE — Telephone Encounter (Signed)
Medication refilled per protocol. 

## 2015-06-14 ENCOUNTER — Other Ambulatory Visit: Payer: Self-pay | Admitting: Family Medicine

## 2015-06-14 DIAGNOSIS — G629 Polyneuropathy, unspecified: Secondary | ICD-10-CM

## 2015-06-14 MED ORDER — GABAPENTIN 300 MG PO CAPS: 300.0000 mg | ORAL_CAPSULE | Freq: Two times a day (BID) | ORAL | Status: DC

## 2015-06-14 NOTE — Telephone Encounter (Signed)
Medication refilled per protocol. 

## 2015-07-12 ENCOUNTER — Other Ambulatory Visit: Payer: Self-pay | Admitting: Family Medicine

## 2015-07-12 DIAGNOSIS — G629 Polyneuropathy, unspecified: Secondary | ICD-10-CM

## 2015-07-12 MED ORDER — AMITRIPTYLINE HCL 50 MG PO TABS: 50.0000 mg | ORAL_TABLET | Freq: Every day | ORAL | Status: DC

## 2015-07-12 NOTE — Telephone Encounter (Signed)
Medication refilled per protocol. 

## 2015-10-06 ENCOUNTER — Other Ambulatory Visit: Payer: Self-pay | Admitting: Family Medicine

## 2015-10-06 DIAGNOSIS — G629 Polyneuropathy, unspecified: Secondary | ICD-10-CM

## 2015-10-06 MED ORDER — AMITRIPTYLINE HCL 50 MG PO TABS: 50.0000 mg | ORAL_TABLET | Freq: Every day | ORAL | Status: DC

## 2015-12-31 ENCOUNTER — Other Ambulatory Visit: Payer: Self-pay | Admitting: *Deleted

## 2015-12-31 MED ORDER — FEBUXOSTAT 80 MG PO TABS: 80.0000 mg | ORAL_TABLET | Freq: Every day | ORAL | Status: DC

## 2015-12-31 NOTE — Telephone Encounter (Signed)
Received fax requesting refill on Uloric.   Refill appropriate and filled per protocol.

## 2016-01-19 ENCOUNTER — Other Ambulatory Visit: Payer: Self-pay | Admitting: Family Medicine

## 2016-01-19 DIAGNOSIS — G629 Polyneuropathy, unspecified: Secondary | ICD-10-CM

## 2016-01-19 MED ORDER — AMITRIPTYLINE HCL 50 MG PO TABS: 50.0000 mg | ORAL_TABLET | Freq: Every day | ORAL | Status: DC

## 2016-02-01 ENCOUNTER — Ambulatory Visit (INDEPENDENT_AMBULATORY_CARE_PROVIDER_SITE_OTHER): Payer: BC Managed Care – PPO | Admitting: Family Medicine

## 2016-02-01 ENCOUNTER — Encounter: Payer: Self-pay | Admitting: Family Medicine

## 2016-02-01 VITALS — BP 126/84 | HR 76 | Temp 98.1°F | Resp 18 | Ht 77.0 in | Wt 262.0 lb

## 2016-02-01 DIAGNOSIS — Z23 Encounter for immunization: Secondary | ICD-10-CM

## 2016-02-01 DIAGNOSIS — E785 Hyperlipidemia, unspecified: Secondary | ICD-10-CM

## 2016-02-01 DIAGNOSIS — M79672 Pain in left foot: Secondary | ICD-10-CM

## 2016-02-01 DIAGNOSIS — Z8546 Personal history of malignant neoplasm of prostate: Secondary | ICD-10-CM

## 2016-02-01 DIAGNOSIS — Z0000 Encounter for general adult medical examination without abnormal findings: Secondary | ICD-10-CM | POA: Diagnosis not present

## 2016-02-01 LAB — COMPLETE METABOLIC PANEL WITH GFR
ALT: 21 U/L (ref 9–46)
AST: 25 U/L (ref 10–35)
Albumin: 4.1 g/dL (ref 3.6–5.1)
Alkaline Phosphatase: 60 U/L (ref 40–115)
BUN: 24 mg/dL (ref 7–25)
CO2: 25 mmol/L (ref 20–31)
Calcium: 8.8 mg/dL (ref 8.6–10.3)
Chloride: 108 mmol/L (ref 98–110)
Creat: 1.2 mg/dL — ABNORMAL HIGH (ref 0.70–1.18)
GFR, Est African American: 69 mL/min (ref >=60)
GFR, Est Non African American: 60 mL/min (ref >=60)
Glucose, Bld: 85 mg/dL (ref 70–99)
Potassium: 4.3 mmol/L (ref 3.5–5.3)
Sodium: 143 mmol/L (ref 135–146)
Total Bilirubin: 0.5 mg/dL (ref 0.2–1.2)
Total Protein: 6.7 g/dL (ref 6.1–8.1)

## 2016-02-01 LAB — CBC WITH DIFFERENTIAL/PLATELET
Basophils Absolute: 0 10*3/uL (ref 0.0–0.1)
Basophils Relative: 0 % (ref 0–1)
Eosinophils Absolute: 0.1 10*3/uL (ref 0.0–0.7)
Eosinophils Relative: 2 % (ref 0–5)
HCT: 46 % (ref 39.0–52.0)
Hemoglobin: 15.5 g/dL (ref 13.0–17.0)
Lymphocytes Relative: 34 % (ref 12–46)
Lymphs Abs: 2.3 10*3/uL (ref 0.7–4.0)
MCH: 31.9 pg (ref 26.0–34.0)
MCHC: 33.7 g/dL (ref 30.0–36.0)
MCV: 94.7 fL (ref 78.0–100.0)
MPV: 10.1 fL (ref 8.6–12.4)
Monocytes Absolute: 0.6 10*3/uL (ref 0.1–1.0)
Monocytes Relative: 9 % (ref 3–12)
Neutro Abs: 3.8 10*3/uL (ref 1.7–7.7)
Neutrophils Relative %: 55 % (ref 43–77)
Platelets: 179 10*3/uL (ref 150–400)
RBC: 4.86 MIL/uL (ref 4.22–5.81)
RDW: 13.3 % (ref 11.5–15.5)
WBC: 6.9 10*3/uL (ref 4.0–10.5)

## 2016-02-01 LAB — LIPID PANEL
CHOLESTEROL: 191 mg/dL (ref 125–200)
HDL: 36 mg/dL — ABNORMAL LOW (ref >=40)
LDL Cholesterol: 109 mg/dL (ref <130)
Total CHOL/HDL Ratio: 5.3 Ratio — ABNORMAL HIGH (ref <=5.0)
Triglycerides: 228 mg/dL — ABNORMAL HIGH (ref <150)
VLDL: 46 mg/dL — AB (ref <30)

## 2016-02-01 NOTE — Addendum Note (Signed)
Addended by: Shary Decamp B on: 02/01/2016 03:48 PM   Modules accepted: Orders

## 2016-02-01 NOTE — Progress Notes (Signed)
Subjective:    Patient ID: Joshua Dominguez, male    DOB: [birth year], 75 y.o.   MRN: [id number]  HPI  Patient is here today for complete physical exam. His last colonoscopy was in 2010 and is up-to-date. He is undergone radioactive seed implantation in 2014 for prostate cancer. This is followed by his urologist. His immunizations are up-to-date including Prevnar, Pneumovax, and the shingles vaccine. He is due for the tetanus shot.    Immunization History  Administered Date(s) Administered  . Influenza Split 08/22/2011, 06/13/2013  . Influenza Whole 08/16/2010  . Pneumococcal Conjugate-13 12/11/2013  . Pneumococcal Polysaccharide-23 09/13/2004, 06/14/2010  . Td 04/18/2005  . Zoster 06/05/2011    Past Medical History  Diagnosis Date  . Gout   . Allergic rhinitis   . Hemorrhoids   . DDD (degenerative disc disease), lumbar      L3-L4  . Lumbar herniated disc 2007    L4 and L5 did not require surgery  . History of prostatitis   . ED (erectile dysfunction) of organic origin   . Prostate cancer     S/P  RADIOACTIVE SEED IMPLANTS 05-09-2013  . Bilateral hydrocele    Past Surgical History  Procedure Laterality Date  . Knee arthroscopy Left   . Appendectomy  FEB 2000  . Cataract extraction w/ intraocular lens  implant, bilateral    . Radioactive seed implant N/A 05/09/2013    Procedure: RADIOACTIVE SEED IMPLANT;  Surgeon: Claybon Jabs, MD;  Location: [facility];  Service: Urology;  Laterality: N/A;  . Hydrocele excision Bilateral 01/02/2014    Procedure: BILATERAL HYDROCELECTOMY;  Surgeon: Claybon Jabs, MD;  Location[facility];  Service: Urology;  Laterality: Bilateral;  . Hydrocele excision Left 04/17/2014    Procedure: LEFT HYDROCELECTOMY ADULT;  Surgeon: Claybon Jabs, MD;  Location: [facility];  Service: Urology;  Laterality: Left;   Current Outpatient Prescriptions on File Prior to Visit  Medication Sig Dispense Refill  .  amitriptyline (ELAVIL) 50 MG tablet Take 1 tablet (50 mg total) by mouth at bedtime. 90 tablet 3  . Cholecalciferol (VITAMIN D3) 1000 UNITS CAPS Take 1 capsule by mouth daily.    . colchicine 0.6 MG tablet Take two by mouth now then one by mouth in one hour if no better. 10 tablet 0  . Febuxostat (ULORIC) 80 MG TABS Take 1 tablet (80 mg total) by mouth daily. 90 each 3  . gabapentin (NEURONTIN) 300 MG capsule Take 1 capsule (300 mg total) by mouth 2 (two) times daily. 3 TABS QAM  /   2 TABS  QPM 450 capsule 3  . glucosamine-chondroitin 500-400 MG tablet Take 1 tablet by mouth 2 (two) times daily.     Marland Kitchen ibuprofen (ADVIL,MOTRIN) 200 MG tablet Take 200 mg by mouth every 6 (six) hours as needed for pain.    . Multiple Vitamin (MULTIVITAMIN) tablet Take 1 tablet by mouth daily.     . Omega-3 Fatty Acids (FISH OIL) 1200 MG CAPS Take 1 capsule by mouth 2 (two) times daily.      No current facility-administered medications on file prior to visit.   Allergies  Allergen Reactions  . Celebrex [Celecoxib] Rash  . Oxymetazoline Hcl Rash   Social History   Social History  . Marital Status: Married    Spouse Name: N/A  . Number of Children: N/A  . Years of Education: N/A   Occupational History  . Consulting/professor  Social History Main Topics  . Smoking status: Former Smoker    Types: Pipe    Quit date: 11/13/1976  . Smokeless tobacco: Never Used  . Alcohol Use: 8.4 oz/week    14 Glasses of wine per week  . Drug Use: No  . Sexual Activity: Not on file   Other Topics Concern  . 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 Hypertension Father   . Gout Father   . Prostate cancer Father   . Lung  cancer Mother     +smoker  . Lung cancer Maternal Grandfather     -smoker  . Heart disease Sister   . Heart disease Brother       Review of Systems  All other systems reviewed and are negative.      Objective:   Physical Exam  Constitutional: He is oriented to person, place, and time. He appears well-developed and well-nourished. No distress.  HENT:  Head: Normocephalic and atraumatic.  Right Ear: External ear normal.  Left Ear: External ear normal.  Nose: Nose normal.  Mouth/Throat: Oropharynx is clear and moist. No oropharyngeal exudate.  Eyes: Conjunctivae and EOM are normal. Pupils are equal, round, and reactive to light. Right eye exhibits no discharge. Left eye exhibits no discharge. No scleral icterus.  Neck: Normal range of motion. Neck supple. No JVD present. No thyromegaly present.  Cardiovascular: Normal rate, regular rhythm and normal heart sounds.  Exam reveals no gallop and no friction rub.   No murmur heard. Pulmonary/Chest: Effort normal and breath sounds normal. No stridor. No respiratory distress. He has no wheezes. He has no rales. He exhibits no tenderness.  Abdominal: Soft. Bowel sounds are normal. He exhibits no distension and no mass. There is no tenderness. There is no rebound and no guarding.  Musculoskeletal: Normal range of motion. He exhibits no edema or tenderness.  Lymphadenopathy:    He has no cervical adenopathy.  Neurological: He is alert and oriented to person, place, and time. He has normal reflexes. No cranial nerve deficit. He exhibits normal muscle tone. Coordination normal.  Skin: Skin is warm. No rash noted. He is not diaphoretic. No erythema. No pallor.  Psychiatric: He has a normal mood and affect. His behavior is normal. Judgment and thought content normal.  Vitals reviewed.         Assessment & Plan:  Routine general medical examination at a health care facility - Plan: CBC with Differential/Platelet, COMPLETE METABOLIC PANEL  WITH GFR, Lipid panel, PSA  Dyslipidemia - Plan: CBC with Differential/Platelet, COMPLETE METABOLIC PANEL WITH GFR, Lipid panel  History of prostate cancer - Plan: PSA  Foot arch pain, left - Plan: Ambulatory referral to Orthopedic Surgery  Physical exam is completely normal. I will check a CBC, CMP, fasting lipid panel, and a PSA. He will see his urologist for his rectal exam and testicular exam next month. He received his tetanus shot today. He declines hepatitis C screening. The remainder of his cancer screening is up-to-date. He is requesting a referral to orthopedic surgery and due for a second opinion regarding his left foot. He has been diagnosed numerous times with plantar fasciitis but he questions the  diagnosis. He believes that he has some type of neuropathic pain in the foot and he would like to see if his specialist at [facility] for a second opinion prior to any surgery. He has had MRIs as well as numerous x-rays that are normal. Cortisone shots provide him very little relief

## 2016-02-02 LAB — PSA: PSA: 0.12 ng/mL (ref <=4.00)

## 2016-02-03 ENCOUNTER — Encounter: Payer: Self-pay | Admitting: Family Medicine

## 2016-02-09 ENCOUNTER — Encounter: Payer: Self-pay | Admitting: Family Medicine

## 2016-02-09 DIAGNOSIS — M722 Plantar fascial fibromatosis: Secondary | ICD-10-CM | POA: Insufficient documentation

## 2016-02-09 DIAGNOSIS — Q742 Other congenital malformations of lower limb(s), including pelvic girdle: Secondary | ICD-10-CM | POA: Insufficient documentation

## 2016-03-14 ENCOUNTER — Telehealth: Payer: Self-pay | Admitting: Family Medicine

## 2016-03-14 NOTE — Telephone Encounter (Signed)
Message also sent via mychart on 02/03/2016 at 9:42 AM

## 2016-03-14 NOTE — Telephone Encounter (Addendum)
Pt states that he never heard anything from his lab results in March and he would like for Dr. Dennard Schaumann to call him. *Letter was mailed to patient on 02/09/16 [phone]

## 2016-04-18 ENCOUNTER — Ambulatory Visit: Payer: BC Managed Care – PPO | Admitting: Podiatry

## 2016-04-24 ENCOUNTER — Ambulatory Visit: Payer: BC Managed Care – PPO

## 2016-04-24 ENCOUNTER — Ambulatory Visit (INDEPENDENT_AMBULATORY_CARE_PROVIDER_SITE_OTHER): Payer: BC Managed Care – PPO | Admitting: Podiatry

## 2016-04-24 ENCOUNTER — Encounter: Payer: Self-pay | Admitting: Podiatry

## 2016-04-24 VITALS — BP 129/84 | HR 71 | Resp 18

## 2016-04-24 DIAGNOSIS — R52 Pain, unspecified: Secondary | ICD-10-CM

## 2016-04-24 DIAGNOSIS — M2041 Other hammer toe(s) (acquired), right foot: Secondary | ICD-10-CM

## 2016-04-24 DIAGNOSIS — S99922A Unspecified injury of left foot, initial encounter: Secondary | ICD-10-CM

## 2016-04-24 NOTE — Progress Notes (Signed)
   Subjective:    Patient ID: Joshua Dominguez, male    DOB: [birth year], 75 y.o.   MRN: [id number]  HPI  75 year old male presents to the office today for concerns of right fourth toe as well as left second digit toe pain. He states that the right fourth toe is painful mostly at night after he is on his feet all day. He is unsure of what starts the pain and cannot identify cause. Denies any injury to the right fourth toe. He is also noticed swelling of the base of the left second toe however no pain. He states that he did injure this toe in his shoe several weeks ago which may have started this. He doesn't history of gout but states this does not feel like gout. No open sore. No red streaks.   Review of Systems  All other systems reviewed and are negative.      Objective:   Physical Exam General: AAO x3, NAD  Dermatological: Skin is warm, dry and supple bilateral. Nails x 10 are well manicured; remaining integument appears unremarkable at this time. There are no open sores, no preulcerative lesions, no rash or signs of infection present.  Vascular: Dorsalis Pedis artery and Posterior Tibial artery pedal pulses are 2/4 bilateral with immedate capillary fill time. Pedal hair growth present. There is no pain with calf compression, swelling, warmth, erythema.   Neruologic: Grossly intact via light touch bilateral. Vibratory intact via tuning fork bilateral. Protective threshold with Semmes Wienstein monofilament intact to all pedal sites bilateral.  Musculoskeletal: There is semirigid hammertoe contractures. There is mild irritation of the dorsal to the right fourth dorsal PIPJ. There is no specific area of tenderness this time however subjective gets pain upon palpation of the PIPJ. There is no open lesion hyperkeratotic lesion. There is no edema or other erythema. No increase in warmth. There left second digit there is edema to the base of the toe and faint erythema without any increase in warmth.  There is no ascending cellulitis. There is no tenderness the toe at this time. No other areas of tenderness.  Gait: Unassisted, Nonantalgic.      Assessment & Plan:  75 year old male right fourth digit hammertoe, left second digit possible injury -Treatment options discussed including all alternatives, risks, and complications  -Recommended x-rays but he declined. -Etiology of symptoms were discusseI discussed some likely hammertoe causing irritation of the right fourth toe and possible injury to the left second toe. Also discuss gout but this does not feel like gout. Also discussed infection however there is no increase in warmth or open sore. He still wishes to hold off on x-ray. -Offloading pads were dispensed. Discussed shoe gear modifications. -If symptoms continue recommend x-rays. -Follow-up as needed. Call if questions or concerns.  Celesta Gentile, DPM

## 2016-04-26 ENCOUNTER — Encounter: Payer: Self-pay | Admitting: Podiatry

## 2016-05-17 ENCOUNTER — Encounter: Payer: Self-pay | Admitting: Diagnostic Neuroimaging

## 2016-06-01 ENCOUNTER — Encounter: Payer: BC Managed Care – PPO | Admitting: Diagnostic Neuroimaging

## 2016-06-14 ENCOUNTER — Other Ambulatory Visit: Payer: Self-pay | Admitting: Family Medicine

## 2016-06-14 DIAGNOSIS — G629 Polyneuropathy, unspecified: Secondary | ICD-10-CM

## 2016-06-27 ENCOUNTER — Ambulatory Visit (INDEPENDENT_AMBULATORY_CARE_PROVIDER_SITE_OTHER): Payer: BC Managed Care – PPO | Admitting: Diagnostic Neuroimaging

## 2016-06-27 ENCOUNTER — Encounter (INDEPENDENT_AMBULATORY_CARE_PROVIDER_SITE_OTHER): Payer: Self-pay | Admitting: Diagnostic Neuroimaging

## 2016-06-27 DIAGNOSIS — M79672 Pain in left foot: Secondary | ICD-10-CM

## 2016-06-27 DIAGNOSIS — Z0289 Encounter for other administrative examinations: Secondary | ICD-10-CM

## 2016-06-27 NOTE — Procedures (Signed)
   [facility]  NCS (NERVE CONDUCTION STUDY) WITH EMG (ELECTROMYOGRAPHY) REPORT   STUDY DATE: 06/27/16 PATIENT NAME: Joshua Dominguez DOB: 20-Jun-1941 MRN: [id number]  ORDERING CLINICIAN: Berle Mull, MD  TECHNOLOGIST: Laretta Alstrom  ELECTROMYOGRAPHER: Earlean Polka. Penumalli, MD  CLINICAL INFORMATION: 75 year old male with left foot pain and burning sensation. Also with history of idiopathic neuropathy.  FINDINGS: NERVE CONDUCTION STUDY: Bilateral peroneal and tibial F wave latencies are prolonged, could be related to patient's height of 6 foot 5 inches tall.  Right peroneal motor response is normal. Left peroneal and bilateral tibial motor responses have normal distal latencies and decreased amplitudes.  Bilateral peroneal sensory responses could not be obtained. Bilateral medial and lateral plantar responses could not be obtained.   NEEDLE ELECTROMYOGRAPHY: Needle examination of left lower extremity: Vastus medialis is normal. Tibialis anterior and gastrocnemius have no abnormal sponges activity at rest and decreased motor unit recruitment with large motor units on exertion.  Left L4-5 and L5-S1 paraspinal muscles show no abnormal spontaneous activity. Patient had decreased relaxation of paraspinal muscles during test.   IMPRESSION:  Normal study demonstrating: 1. Distal axonal sensorimotor polyneuropathy. 2. Chronic denervation in left gastrocnemius and left tibialis anterior muscles may be related to underlying peripheral neuropathy versus chronic lumbar radiculopathies.    INTERPRETING PHYSICIAN:  Penni Bombard, MD Certified in Neurology, Neurophysiology and Neuroimaging  [facility] [address], [place], [place] [address] [phone]

## 2016-07-13 ENCOUNTER — Telehealth: Payer: Self-pay | Admitting: Diagnostic Neuroimaging

## 2016-07-13 NOTE — Telephone Encounter (Signed)
Spoke with patient who stated he called to ask about a referral to a foot specialist in [place]. He stated he was told by someone that he was being referred. Advised him that a referral from Dr Leta Baptist is not in his EMR in this office, therefore it must have been another provider. He then stated that it may have been Dr Alfonso Ramus. He inquired if Dr Alfonso Ramus, who ordered the NCS would receive results; advised him that he should have. Confirmed patient is active on My Chart, so this RN advised he check My Chart for NCS results. He stated he would, and he would call Dr Minna Merritts office re: referral to dr in [place].  He verbalized understanding, appreciation for call back.

## 2016-07-13 NOTE — Telephone Encounter (Signed)
Pt wants a call back about his NCV/EMG results dg

## 2016-07-13 NOTE — Telephone Encounter (Signed)
I already discussed results with him at the time of the test; please find out what his question is. -VRP

## 2016-07-13 NOTE — Telephone Encounter (Signed)
Attempted to reach patient; person who answered phone stated he is at work, will return home approximately 4 pm today. Will call later today or tomorrow prior to closing.

## 2016-07-25 NOTE — Telephone Encounter (Signed)
Patient called to inquire who NCS results were sent to at [facility]? Patient advised of previous conversation w/nurse Verneita Griffes and Dr. Alfonso Ramus should have received results, patient asks if this is who results were sent to and Dr. Minna Merritts first name. [phone].

## 2016-07-25 NOTE — Telephone Encounter (Signed)
Pt wife called said she got disconnected and to let the RN know the results should go to Dr Alfonso Ramus

## 2016-07-25 NOTE — Telephone Encounter (Addendum)
LVM on mobile # advising patient that Dr Berle Mull referred him here for NCS, so the results would have been sent back to Dr Alfonso Ramus. Left name, number for further questions.  Reached patient on home phone and advised him of same. He verbalized understanding, appreciation.

## 2016-07-25 NOTE — Telephone Encounter (Signed)
EMG/NCS results faxed to Dr Berle Mull as pt requested.

## 2016-10-17 ENCOUNTER — Other Ambulatory Visit: Payer: Self-pay | Admitting: Family Medicine

## 2016-10-17 DIAGNOSIS — G629 Polyneuropathy, unspecified: Secondary | ICD-10-CM

## 2016-12-10 ENCOUNTER — Other Ambulatory Visit: Payer: Self-pay | Admitting: Family Medicine

## 2017-02-05 ENCOUNTER — Ambulatory Visit (INDEPENDENT_AMBULATORY_CARE_PROVIDER_SITE_OTHER): Payer: BC Managed Care – PPO | Admitting: Family Medicine

## 2017-02-05 ENCOUNTER — Encounter: Payer: Self-pay | Admitting: Family Medicine

## 2017-02-05 VITALS — BP 134/80 | HR 78 | Temp 97.6°F | Resp 14 | Ht 77.0 in | Wt 269.0 lb

## 2017-02-05 DIAGNOSIS — Z0000 Encounter for general adult medical examination without abnormal findings: Secondary | ICD-10-CM

## 2017-02-05 LAB — CBC WITH DIFFERENTIAL/PLATELET
BASOS ABS: 54 {cells}/uL (ref 0–200)
Basophils Relative: 1 %
Eosinophils Absolute: 108 cells/uL (ref 15–500)
Eosinophils Relative: 2 %
HCT: 47.8 % (ref 38.5–50.0)
HEMOGLOBIN: 15.6 g/dL (ref 13.0–17.0)
LYMPHS ABS: 2106 {cells}/uL (ref 850–3900)
Lymphocytes Relative: 39 %
MCH: 30.5 pg (ref 27.0–33.0)
MCHC: 32.6 g/dL (ref 32.0–36.0)
MCV: 93.4 fL (ref 80.0–100.0)
MPV: 9.8 fL (ref 7.5–12.5)
Monocytes Absolute: 486 cells/uL (ref 200–950)
Monocytes Relative: 9 %
NEUTROS ABS: 2646 {cells}/uL (ref 1500–7800)
NEUTROS PCT: 49 %
Platelets: 182 10*3/uL (ref 140–400)
RBC: 5.12 MIL/uL (ref 4.20–5.80)
RDW: 13.5 % (ref 11.0–15.0)
WBC: 5.4 10*3/uL (ref 3.8–10.8)

## 2017-02-05 LAB — LIPID PANEL
Cholesterol: 191 mg/dL (ref <200)
HDL: 33 mg/dL — AB (ref >40)
LDL CALC: 120 mg/dL — AB (ref <100)
Total CHOL/HDL Ratio: 5.8 Ratio — ABNORMAL HIGH (ref <5.0)
Triglycerides: 192 mg/dL — ABNORMAL HIGH (ref <150)
VLDL: 38 mg/dL — ABNORMAL HIGH (ref <30)

## 2017-02-05 LAB — COMPLETE METABOLIC PANEL WITH GFR
ALBUMIN: 3.9 g/dL (ref 3.6–5.1)
ALK PHOS: 51 U/L (ref 40–115)
ALT: 18 U/L (ref 9–46)
AST: 23 U/L (ref 10–35)
BILIRUBIN TOTAL: 0.7 mg/dL (ref 0.2–1.2)
BUN: 20 mg/dL (ref 7–25)
CO2: 26 mmol/L (ref 20–31)
CREATININE: 1.24 mg/dL — AB (ref 0.70–1.18)
Calcium: 9 mg/dL (ref 8.6–10.3)
Chloride: 107 mmol/L (ref 98–110)
GFR, Est African American: 66 mL/min (ref >=60)
GFR, Est Non African American: 57 mL/min — ABNORMAL LOW (ref >=60)
GLUCOSE: 88 mg/dL (ref 70–99)
Potassium: 4.5 mmol/L (ref 3.5–5.3)
SODIUM: 141 mmol/L (ref 135–146)
TOTAL PROTEIN: 6.4 g/dL (ref 6.1–8.1)

## 2017-02-05 LAB — PSA: PSA: 0.1 ng/mL (ref <=4.0)

## 2017-02-05 NOTE — Progress Notes (Signed)
Subjective:    Patient ID: Joshua Dominguez, male    DOB: 12/07/1940, 76 y.o.   MRN: [id number]  HPI Patient is here today for complete physical exam. His last colonoscopy was in 2010 and is up-to-date. He is undergone radioactive seed implantation in 2014 for prostate cancer. This is followed by his urologist, Dr. Karsten Ro. His immunizations are up-to-date including Prevnar, Pneumovax, and the shingles vaccine, and Tdap.    Immunization History  Administered Date(s) Administered  . Influenza Split 08/22/2011, 06/13/2013  . Influenza Whole 08/16/2010  . Influenza-Unspecified 07/20/2016  . Pneumococcal Conjugate-13 12/11/2013  . Pneumococcal Polysaccharide-23 09/13/2004, 06/14/2010, 11/27/2011  . Td 04/18/2005  . Tdap 02/01/2016  . Zoster 06/05/2011    Past Medical History:  Diagnosis Date  . Allergic rhinitis   . Bilateral hydrocele   . DDD (degenerative disc disease), lumbar     L3-L4  . ED (erectile dysfunction) of organic origin   . Gout   . Hemorrhoids   . History of prostatitis   . Lumbar herniated disc 2007   L4 and L5 did not require surgery  . Prostate cancer ([place])    S/P  RADIOACTIVE SEED IMPLANTS 05-09-2013   Past Surgical History:  Procedure Laterality Date  . APPENDECTOMY  FEB 2000  . CATARACT EXTRACTION W/ INTRAOCULAR LENS  IMPLANT, BILATERAL    . HYDROCELE EXCISION Bilateral 01/02/2014   Procedure: BILATERAL HYDROCELECTOMY;  Surgeon: Claybon Jabs, MD;  [facility];  Service: Urology;  Laterality: Bilateral;  . HYDROCELE EXCISION Left 04/17/2014   Procedure: LEFT HYDROCELECTOMY ADULT;  Surgeon: Claybon Jabs, MD;  Location[facility];  Service: Urology;  Laterality: Left;  . KNEE ARTHROSCOPY Left   . RADIOACTIVE SEED IMPLANT N/A 05/09/2013   Procedure: RADIOACTIVE SEED IMPLANT;  Surgeon: Claybon Jabs, MD;  Location: [facility];  Service: Urology;  Laterality: N/A;   Current Outpatient Prescriptions  on File Prior to Visit  Medication Sig Dispense Refill  . amitriptyline (ELAVIL) 50 MG tablet take 1 tablet by mouth at bedtime 90 tablet 3  . colchicine 0.6 MG tablet Take two by mouth now then one by mouth in one hour if no better. (Patient taking differently: Take two by mouth now then one by mouth in one hour if no better. PRN) 10 tablet 0  . gabapentin (NEURONTIN) 300 MG capsule TAKE 3 CAPSULES EVERY MORNING AND 2 EVERY EVENING AS DIRECTED 450 capsule 3  . glucosamine-chondroitin 500-400 MG tablet Take 1 tablet by mouth 2 (two) times daily.     . Multiple Vitamin (MULTIVITAMIN) tablet Take 1 tablet by mouth daily.     . Omega-3 Fatty Acids (FISH OIL) 1200 MG CAPS Take 1 capsule by mouth 2 (two) times daily.     Marland Kitchen ULORIC 80 MG TABS take 1 tablet by mouth once daily 90 tablet 3   No current facility-administered medications on file prior to visit.    Allergies  Allergen Reactions  . Celebrex [Celecoxib] Rash  . Oxymetazoline Hcl Rash   Social History   Social History  . Marital status: Married    Spouse name: N/A  . Number of children: N/A  . Years of education: N/A   Occupational History  . Consulting/professor    Social History Main Topics  . Smoking status: Former Smoker    Types: Pipe    Quit date: 11/13/1976  . Smokeless tobacco: Never Used  . Alcohol use 8.4 oz/week  14 Glasses of wine per week  . Drug use: No  . Sexual activity: Not on file   Other Topics Concern  . 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 Hypertension Father   . Gout Father   . Prostate cancer Father   . Lung cancer Mother     +smoker  . Lung cancer Maternal Grandfather     -smoker  . Heart disease  Sister   . Heart disease Brother       Review of Systems  All other systems reviewed and are negative.      Objective:   Physical Exam  Constitutional: He is oriented to person, place, and time. He appears well-developed and well-nourished. No distress.  HENT:  Head: Normocephalic and atraumatic.  Right Ear: External ear normal.  Left Ear: External ear normal.  Nose: Nose normal.  Mouth/Throat: Oropharynx is clear and moist. No oropharyngeal exudate.  Eyes: Conjunctivae and EOM are normal. Pupils are equal, round, and reactive to light. Right eye exhibits no discharge. Left eye exhibits no discharge. No scleral icterus.  Neck: Normal range of motion. Neck supple. No JVD present. No thyromegaly present.  Cardiovascular: Normal rate, regular rhythm and normal heart sounds.  Exam reveals no gallop and no friction rub.   No murmur heard. Pulmonary/Chest: Effort normal and breath sounds normal. No stridor. No respiratory distress. He has no wheezes. He has no rales. He exhibits no tenderness.  Abdominal: Soft. Bowel sounds are normal. He exhibits no distension and no mass. There is no tenderness. There is no rebound and no guarding.  Musculoskeletal: Normal range of motion. He exhibits no edema or tenderness.  Lymphadenopathy:    He has no cervical adenopathy.  Neurological: He is alert and oriented to person, place, and time. He has normal reflexes. No cranial nerve deficit. He exhibits normal muscle tone. Coordination normal.  Skin: Skin is warm. No rash noted. He is not diaphoretic. No erythema. No pallor.  Psychiatric: He has a normal mood and affect. His behavior is normal. Judgment and thought content normal.  Vitals reviewed.         Assessment & Plan:  General medical exam - Plan: CBC with Differential/Platelet, COMPLETE METABOLIC PANEL WITH GFR, Lipid panel, PSA  Physical exam is completely normal. I will check a CBC, CMP, fasting lipid panel, and a PSA. He will see his  urologist for his rectal exam and testicular exam next month.  He declines hepatitis C screening. The remainder of his cancer screening is up-to-date.

## 2017-02-06 ENCOUNTER — Encounter: Payer: Self-pay | Admitting: Family Medicine

## 2017-06-06 ENCOUNTER — Other Ambulatory Visit: Payer: Self-pay | Admitting: Family Medicine

## 2017-06-06 DIAGNOSIS — G629 Polyneuropathy, unspecified: Secondary | ICD-10-CM

## 2017-06-14 ENCOUNTER — Ambulatory Visit (INDEPENDENT_AMBULATORY_CARE_PROVIDER_SITE_OTHER): Payer: BC Managed Care – PPO | Admitting: Family Medicine

## 2017-06-14 ENCOUNTER — Encounter: Payer: Self-pay | Admitting: Family Medicine

## 2017-06-14 VITALS — BP 136/82 | HR 64 | Temp 98.4°F | Resp 16 | Ht 77.0 in | Wt 267.0 lb

## 2017-06-14 DIAGNOSIS — G4710 Hypersomnia, unspecified: Secondary | ICD-10-CM | POA: Diagnosis not present

## 2017-06-14 NOTE — Progress Notes (Signed)
Subjective:    Patient ID: Joshua Dominguez, male    DOB: 06-Nov-1941, 76 y.o.   MRN: [id number]  HPI Patient is here today concerned about sleep apnea. His wife hears him stop breathing at night and is very concerned. He also admits that he snores extremely loudly. However he denies any symptoms. He reports mild hypersomnia but not severe. He denies any early morning headache. I screened the patient with the Epworth Sleepiness Scale score. He scored a 4. He reports a slight chance of falling asleep after lunch, while riding in a car, lying down in the middle the day resting, watching TV. Otherwise he denies any other symptoms of hypersomnia. Past Medical History:  Diagnosis Date  . Allergic rhinitis   . Bilateral hydrocele   . DDD (degenerative disc disease), lumbar     L3-L4  . ED (erectile dysfunction) of organic origin   . Gout   . Hemorrhoids   . History of prostatitis   . Lumbar herniated disc 2007   L4 and L5 did not require surgery  . Prostate cancer ([place])    S/P  RADIOACTIVE SEED IMPLANTS 05-09-2013   Past Surgical History:  Procedure Laterality Date  . APPENDECTOMY  FEB 2000  . CATARACT EXTRACTION W/ INTRAOCULAR LENS  IMPLANT, BILATERAL    . HYDROCELE EXCISION Bilateral 01/02/2014   Procedure: BILATERAL HYDROCELECTOMY;  Surgeon: Claybon Jabs, MD;  Location[facility];  Service: Urology;  Laterality: Bilateral;  . HYDROCELE EXCISION Left 04/17/2014   Procedure: LEFT HYDROCELECTOMY ADULT;  Surgeon: Claybon Jabs, MD;  Location: [facility];  Service: Urology;  Laterality: Left;  . KNEE ARTHROSCOPY Left   . RADIOACTIVE SEED IMPLANT N/A 05/09/2013   Procedure: RADIOACTIVE SEED IMPLANT;  Surgeon: Claybon Jabs, MD;  Location: [facility];  Service: Urology;  Laterality: N/A;   Current Outpatient Prescriptions on File Prior to Visit  Medication Sig Dispense Refill  . amitriptyline (ELAVIL) 50 MG tablet take 1 tablet by mouth at  bedtime 90 tablet 3  . colchicine 0.6 MG tablet Take two by mouth now then one by mouth in one hour if no better. (Patient taking differently: Take two by mouth now then one by mouth in one hour if no better. PRN) 10 tablet 0  . Diclofenac Sodium 2 % SOLN Place onto the skin daily.    Marland Kitchen gabapentin (NEURONTIN) 300 MG capsule take 3 capsules by mouth every morning AND 2 EVERY EVENING AS DIRECTED 450 capsule 3  . glucosamine-chondroitin 500-400 MG tablet Take 1 tablet by mouth 2 (two) times daily.     . Multiple Vitamin (MULTIVITAMIN) tablet Take 1 tablet by mouth daily.     . Omega-3 Fatty Acids (FISH OIL) 1200 MG CAPS Take 1 capsule by mouth 2 (two) times daily.     Marland Kitchen ULORIC 80 MG TABS take 1 tablet by mouth once daily 90 tablet 3   No current facility-administered medications on file prior to visit.    Allergies  Allergen Reactions  . Celebrex [Celecoxib] Rash  . Oxymetazoline Hcl Rash   Social History   Social History  . Marital status: Married    Spouse name: N/A  . Number of children: N/A  . Years of education: N/A   Occupational History  . Consulting/professor    Social History Main Topics  . Smoking status: Former Smoker    Types: Pipe    Quit date: 11/13/1976  . Smokeless tobacco: Never Used  .  Alcohol use 8.4 oz/week    14 Glasses of wine per week  . Drug use: No  . Sexual activity: Not on file   Other Topics Concern  . 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      Objective:   Physical Exam  Constitutional: He is oriented to person, place, and time. He appears well-developed and well-nourished.  Cardiovascular: Normal rate, regular rhythm and normal  heart sounds.   No murmur heard. Pulmonary/Chest: Effort normal and breath sounds normal. No respiratory distress. He has no wheezes. He has no rales.  Neurological: He is alert and oriented to person, place, and time. No cranial nerve deficit. He exhibits normal muscle tone. Coordination normal.  Vitals reviewed.         Assessment & Plan:  Hypersomnia - Plan: Ambulatory referral to Sleep Studies Given his low ESS, I believe the likelihood of severe obstructive sleep apnea is low. However his wife is very concerned and he would like to proceed with a sleep study. Therefore I will consult [place] neurology regarding a split-level sleep study.

## 2017-08-09 ENCOUNTER — Institutional Professional Consult (permissible substitution): Payer: BC Managed Care – PPO | Admitting: Neurology

## 2017-08-16 ENCOUNTER — Institutional Professional Consult (permissible substitution): Payer: BC Managed Care – PPO | Admitting: Neurology

## 2017-09-06 ENCOUNTER — Institutional Professional Consult (permissible substitution): Payer: BC Managed Care – PPO | Admitting: Neurology

## 2017-09-06 ENCOUNTER — Encounter: Payer: Self-pay | Admitting: Neurology

## 2017-09-06 ENCOUNTER — Ambulatory Visit (INDEPENDENT_AMBULATORY_CARE_PROVIDER_SITE_OTHER): Payer: BC Managed Care – PPO | Admitting: Neurology

## 2017-09-06 VITALS — BP 140/91 | HR 72 | Ht 77.0 in | Wt 269.0 lb

## 2017-09-06 DIAGNOSIS — E669 Obesity, unspecified: Secondary | ICD-10-CM

## 2017-09-06 DIAGNOSIS — R0683 Snoring: Secondary | ICD-10-CM

## 2017-09-06 DIAGNOSIS — G479 Sleep disorder, unspecified: Secondary | ICD-10-CM | POA: Diagnosis not present

## 2017-09-06 DIAGNOSIS — R0681 Apnea, not elsewhere classified: Secondary | ICD-10-CM

## 2017-09-06 NOTE — Progress Notes (Signed)
Subjective:    Patient ID: Joshua Dominguez is a 76 y.o. male.  HPI     Star Age, MD, PhD [facility] [address] [place], [place] [address]  Dear Dr. Dennard Schaumann,   I saw your patient, Joshua Dominguez, upon your kind request in my neurologic clinic today for initial consultation of his sleep disorder, in particular, concern for underlying obstructive sleep apnea. The patient is unaccompanied today. As you know, Joshua Dominguez is a 76 year old right-handed gentleman with an underlying medical history of allergic rhinitis, gout, hemorrhoids, history of prostatitis, history of prostate cancer, degenerative lumbar spine disease, erectile dysfunction, status post multiple surgeries including hydrocele correction, appendectomy, cataract extractions, arthroscopic knee surgery on the left, radioactive seed implants, and history of obesity, who reports snoring and witnessed apneas per wife's report. I reviewed your office note from 06/14/2017.  In the past, some 10 years ago, he had RLS symptoms, but not so much not so much any more. He is a runner. He works out routinely. He has peripheral neuropathy, had EMG in 8/17. He is a professor at [employer]. He has 2 sons and 2 grandsons.  His Epworth sleepiness score is 6 out of 24 today, fatigue score is 16 out of 63. He has L foot pain.  His bedtime is around 11 PM, wakeup time around 7:30. He sometimes takes a nap. He has no night to night Nocturia and denies morning headaches.He has no family history of obstructive sleep apnea.He quit smoking over 50 years ago. He drinks alcohol in the form of wine, usually 2 glasses per day. He drinks caffeine in the form of coffee, one cup per day on average.  His Past Medical History Is Significant For: Past Medical History:  Diagnosis Date  . Allergic rhinitis   . Bilateral hydrocele   . DDD (degenerative disc disease), lumbar     L3-L4  . ED (erectile dysfunction) of organic  origin   . Gout   . Hemorrhoids   . History of prostatitis   . Lumbar herniated disc 2007   L4 and L5 did not require surgery  . Prostate cancer ([place])    S/P  RADIOACTIVE SEED IMPLANTS 05-09-2013    His Past Surgical History Is Significant For: Past Surgical History:  Procedure Laterality Date  . APPENDECTOMY  FEB 2000  . CATARACT EXTRACTION W/ INTRAOCULAR LENS  IMPLANT, BILATERAL    . HYDROCELE EXCISION Bilateral 01/02/2014   Procedure: BILATERAL HYDROCELECTOMY;  Surgeon: Claybon Jabs, MD;  Location: [facility];  Service: Urology;  Laterality: Bilateral;  . HYDROCELE EXCISION Left 04/17/2014   Procedure: LEFT HYDROCELECTOMY ADULT;  Surgeon: Claybon Jabs, MD;  Location: [facility];  Service: Urology;  Laterality: Left;  . KNEE ARTHROSCOPY Left   . RADIOACTIVE SEED IMPLANT N/A 05/09/2013   Procedure: RADIOACTIVE SEED IMPLANT;  Surgeon: Claybon Jabs, MD;  Location: [facility];  Service: Urology;  Laterality: N/A;    His Family History Is Significant For: Family History  Problem Relation Age of Onset  . Hypertension Father   . Gout Father   . Prostate cancer Father   . Lung cancer Mother        +smoker  . Lung cancer Maternal Grandfather        -smoker  . Heart disease Sister   . Heart disease Brother     His Social History Is Significant For: Social History   Social History  .  Marital status: Married    Spouse name: N/A  . Number of children: N/A  . Years of education: N/A   Occupational History  . Consulting/professor    Social History Main Topics  . Smoking status: Former Smoker    Types: Pipe    Quit date: 11/13/1976  . Smokeless tobacco: Never Used  . Alcohol use 8.4 oz/week    14 Glasses of wine per week  . Drug use: No  . Sexual activity: Not Asked   Other Topics Concern  . 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 Celebrex [Celecoxib] Rash  . Oxymetazoline Hcl Rash  :  His Current Medications Are:  Outpatient Encounter Prescriptions as of 09/06/2017  Medication Sig  . amitriptyline (ELAVIL) 50 MG tablet take 1 tablet by mouth at bedtime  . colchicine 0.6 MG tablet Take two by mouth now then one by mouth in one hour if no better. (Patient taking differently: Take two by mouth now then one by mouth in one hour if no better. PRN)  . Diclofenac Sodium 2 % SOLN Place onto the skin daily.  Marland Kitchen gabapentin (NEURONTIN) 300 MG capsule take 3 capsules by mouth every morning AND 2 EVERY EVENING AS DIRECTED  . glucosamine-chondroitin 500-400 MG tablet Take 1 tablet by mouth 2 (two) times daily.   . Multiple Vitamin (MULTIVITAMIN) tablet Take 1 tablet by mouth daily.   . Omega-3 Fatty Acids (FISH OIL) 1200 MG CAPS Take 1 capsule by mouth 2 (two) times daily.   Marland Kitchen ULORIC 80 MG TABS take 1 tablet by mouth once daily   No facility-administered encounter medications on file as of 09/06/2017.   :  Review of Systems:  Out of a complete 14 point review of systems, all are reviewed and negative with the exception of these symptoms as listed below: Review of Systems  Neurological:       Pt presents today to discuss his sleep. Pt has never had a sleep study but does endorse snoring.  Epworth Sleepiness Scale 0= would never doze 1= slight chance of dozing 2= moderate chance of dozing 3= high chance of dozing  Sitting and reading: 1 Watching TV: 1 Sitting inactive in a public place (ex. Theater or meeting): 0 As a passenger in a car for an hour without a break: 1 Lying down to rest in the afternoon: 3 Sitting and talking to someone: 0 Sitting quietly after lunch (no alcohol): 0 In a car, while stopped in  traffic: 0 Total: 6     Objective:  Neurological Exam  Physical Exam Physical Examination:   Vitals:   09/06/17 1307  BP: (!) 140/91  Pulse: 72    General Examination: The patient is a very pleasant 76 y.o. male in no acute distress. He appears well-developed and well-nourished and well groomed.   HEENT: Normocephalic, atraumatic, pupils are equal, round and reactive to light and accommodation. Extraocular tracking is good without limitation to gaze excursion or nystagmus noted. Normal smooth pursuit is noted. Hearing is grossly intact. Face is symmetric with normal facial animation and normal facial sensation. Speech is clear with no dysarthria noted. There is no hypophonia. There is no lip,  neck/head, jaw or voice tremor. Neck is supple with full range of passive and active motion. There are no carotid bruits on auscultation. Oropharynx exam reveals: mild mouth dryness, good dental hygiene and moderate airway crowding, due to larger uvula, redundant soft palate. Mallampati is class II. Tongue protrudes centrally and palate elevates symmetrically. Tonsils are small. Neck size is 18.25 inches. He has a Mild overbite. Nasal inspection reveals no significant nasal mucosal bogginess or redness and no septal deviation.   Chest: Clear to auscultation without wheezing, rhonchi or crackles noted.  Heart: S1+S2+0, regular and normal without murmurs, rubs or gallops noted.   Abdomen: Soft, non-tender and non-distended with normal bowel sounds appreciated on auscultation.  Extremities: There is no pitting edema in the distal lower extremities bilaterally. Pedal pulses are intact.  Skin: Warm and dry without trophic changes noted.  Musculoskeletal: exam reveals no obvious joint deformities, tenderness or joint swelling or erythema.   Neurologically:  Mental status: The patient is awake, alert and oriented in all 4 spheres. His immediate and remote memory, attention, language skills and fund of  knowledge are appropriate. There is no evidence of aphasia, agnosia, apraxia or anomia. Speech is clear with normal prosody and enunciation. Thought process is linear. Mood is normal and affect is normal.  Cranial nerves II - XII are as described above under HEENT exam. In addition: shoulder shrug is normal with equal shoulder height noted. Motor exam: Normal bulk, strength and tone is noted. There is no drift, tremor or rebound. Romberg is negative. Reflexes are 2+ throughout. Fine motor skills and coordination: intact with normal finger taps, normal hand movements, normal rapid alternating patting, normal foot taps and normal foot agility.  Cerebellar testing: No dysmetria or intention tremor on finger to nose testing. Heel to shin is unremarkable bilaterally. There is no truncal or gait ataxia.  Sensory exam: intact to light touch.   Gait, station and balance: He stands easily. No veering to one side is noted. No leaning to one side is noted. Posture is age-appropriate and stance is narrow based. Gait shows normal stride length and normal pace. No problems turning are noted. Tandem walk is unremarkable.  Assessment and Plan:  In summary, Joshua Dominguez is a very pleasant 76 y.o.-year old male with an underlying medical history of allergic rhinitis, gout, hemorrhoids, history of prostatitis, history of prostate cancer, degenerative lumbar spine disease, erectile dysfunction, status post multiple surgeries including hydrocele correction, appendectomy, cataract extractions, arthroscopic knee surgery on the left, radioactive seed implants, and history of obesity, whose history and physical exam are concerning for obstructive sleep apnea (OSA). I had a long chat with the patient about my findings and the diagnosis of OSA, its prognosis and treatment options. We talked about medical treatments, surgical interventions and non-pharmacological approaches. I explained in particular the risks and ramifications of  untreated moderate to severe OSA, especially with respect to developing cardiovascular disease down the Road, including congestive heart failure, difficult to treat hypertension, cardiac arrhythmias, or stroke. Even type 2 diabetes has, in part, been linked to untreated OSA. Symptoms of untreated OSA include daytime sleepiness, memory problems, mood irritability and mood disorder such as depression and anxiety, lack of energy, as well as recurrent headaches, especially morning headaches. We talked about trying to maintain a healthy lifestyle in general, as well as the importance of weight control. I encouraged the patient to eat healthy, exercise daily and keep well hydrated, to keep a scheduled bedtime and wake time routine, to  not skip any meals and eat healthy snacks in between meals. I advised the patient not to drive when feeling sleepy. I recommended the following at this time: sleep study with potential positive airway pressure titration. (We will score hypopneas at 3%).   I explained the sleep test procedure to the patient and also outlined possible surgical and non-surgical treatment options of OSA, including the use of a custom-made dental device (which would require a referral to a specialist dentist or oral surgeon), upper airway surgical options, such as pillar implants, radiofrequency surgery, tongue base surgery, and UPPP (which would involve a referral to an ENT surgeon). Rarely, jaw surgery such as mandibular advancement may be considered.  I also explained the CPAP treatment option to the patient, who indicated that he would be willing to try CPAP if the need arises. I explained the importance of being compliant with PAP treatment, not only for insurance purposes but primarily to improve His symptoms, and for the patient's long term health benefit, including to reduce His cardiovascular risks. I answered all his questions today and the patient was in agreement. I would like to see him back  after the sleep study is completed and encouraged him to call with any interim questions, concerns, problems or updates.   Thank you very much for allowing me to participate in the care of this nice patient. If I can be of any further assistance to you please do not hesitate to call me at [phone].  Sincerely,   Star Age, MD, PhD

## 2017-09-06 NOTE — Patient Instructions (Addendum)

## 2017-09-18 ENCOUNTER — Other Ambulatory Visit: Payer: Self-pay | Admitting: Otolaryngology

## 2017-09-18 DIAGNOSIS — H903 Sensorineural hearing loss, bilateral: Secondary | ICD-10-CM

## 2017-09-26 ENCOUNTER — Telehealth: Payer: Self-pay | Admitting: Neurology

## 2017-09-26 NOTE — Telephone Encounter (Signed)
We have attempted to call the patient 2 times to schedule sleep study. Patient has been unavailable at the phone numbers we have on file and has not returned our calls. At this point we will send a letter asking pt to please contact the sleep lab to schedule their sleep study. If patient calls back we will schedule them for their sleep study. ° °

## 2017-10-02 ENCOUNTER — Other Ambulatory Visit: Payer: BC Managed Care – PPO

## 2017-11-29 ENCOUNTER — Ambulatory Visit (INDEPENDENT_AMBULATORY_CARE_PROVIDER_SITE_OTHER): Payer: BC Managed Care – PPO | Admitting: Neurology

## 2017-11-29 DIAGNOSIS — G4733 Obstructive sleep apnea (adult) (pediatric): Secondary | ICD-10-CM

## 2017-11-29 DIAGNOSIS — G479 Sleep disorder, unspecified: Secondary | ICD-10-CM

## 2017-11-29 DIAGNOSIS — R0683 Snoring: Secondary | ICD-10-CM

## 2017-11-29 DIAGNOSIS — R0681 Apnea, not elsewhere classified: Secondary | ICD-10-CM

## 2017-11-29 DIAGNOSIS — G4720 Circadian rhythm sleep disorder, unspecified type: Secondary | ICD-10-CM

## 2017-11-29 DIAGNOSIS — E669 Obesity, unspecified: Secondary | ICD-10-CM

## 2017-12-05 ENCOUNTER — Other Ambulatory Visit: Payer: Self-pay | Admitting: Family Medicine

## 2017-12-05 ENCOUNTER — Other Ambulatory Visit: Payer: Self-pay | Admitting: Neurology

## 2017-12-05 DIAGNOSIS — G629 Polyneuropathy, unspecified: Secondary | ICD-10-CM

## 2017-12-05 DIAGNOSIS — G4720 Circadian rhythm sleep disorder, unspecified type: Secondary | ICD-10-CM

## 2017-12-05 DIAGNOSIS — G4733 Obstructive sleep apnea (adult) (pediatric): Secondary | ICD-10-CM

## 2017-12-05 DIAGNOSIS — E669 Obesity, unspecified: Secondary | ICD-10-CM

## 2017-12-05 MED ORDER — AMITRIPTYLINE HCL 50 MG PO TABS: 50.0000 mg | ORAL_TABLET | Freq: Every day | ORAL | 3 refills | Status: DC

## 2017-12-05 MED ORDER — FEBUXOSTAT 80 MG PO TABS: 1.0000 | ORAL_TABLET | Freq: Every day | ORAL | 3 refills | Status: DC

## 2017-12-05 NOTE — Procedures (Signed)
PATIENT'S NAME:  Joshua Dominguez, Joshua Dominguez DOB:      February 14, 1941      MR#:    [id number]     DATE OF RECORDING: 11/29/2017 REFERRING M.D.:  Jenna Luo, MD Study Performed:   Baseline Polysomnogram HISTORY: 77 year old man with a history of allergic rhinitis, gout, hemorrhoids, history of prostatitis, history of prostate cancer, degenerative lumbar spine disease, erectile dysfunction, status post multiple surgeries and obesity, who reports snoring and witnessed apneas per wife's report. The patient endorsed the Epworth Sleepiness Scale at 6 points. The patient's weight 269 pounds with a height of 77 (inches), resulting in a BMI of 31.8 kg/m2. The patient's neck circumference measured 18.25 inches.  CURRENT MEDICATIONS: Elavil, Colchicine, Neurontin, Diclofenac, Uloric   PROCEDURE:  This is a multichannel digital polysomnogram utilizing the Somnostar 11.2 system.  Electrodes and sensors were applied and monitored per AASM Specifications.   EEG, EOG, Chin and Limb EMG, were sampled at 200 Hz.  ECG, Snore and Nasal Pressure, Thermal Airflow, Respiratory Effort, CPAP Flow and Pressure, Oximetry was sampled at 50 Hz. Digital video and audio were recorded.      BASELINE STUDY  Lights Out was at 00:49 and Lights On at 05:03.  Total recording time (TRT) was 255 minutes, with a total sleep time (TST) of  206 minutes.   The patient's sleep latency was 10.5 minutes. REM latency was delayed. The sleep efficiency was 80.8 %.     SLEEP ARCHITECTURE: WASO (Wake after sleep onset) was 48.5 minutes with moderate sleep fragmentation noted. There were 49.5 minutes in Stage N1, 69 minutes Stage N2, 40 minutes Stage N3 and 47.5 minutes in Stage REM.  The percentage of Stage N1 was 24%, which is markedly increased, Stage N2 was 33.5%, Stage N3 was 19.4% and Stage R (REM sleep) was 23.1%. The arousals were noted as: 11 were spontaneous, 0 were associated with PLMs, 46 were associated with respiratory events.  Audio and video  analysis did not show any abnormal or unusual movements, behaviors, phonations or vocalizations. The patient took no bathroom breaks. Moderate to loud snoring was noted. The EKG was in keeping with normal sinus rhythm (NSR).  RESPIRATORY ANALYSIS:  There were a total of 81 respiratory events:  25 obstructive apneas, 0 central apneas and 0 mixed apneas with a total of 25 apneas and an apnea index (AI) of 7.3 /hour. There were 56 hypopneas with a hypopnea index of 16.3 /hour. The patient also had 0 respiratory event related arousals (RERAs).      The total APNEA/HYPOPNEA INDEX (AHI) was 23.6/hour and the total RESPIRATORY DISTURBANCE INDEX was 23.6 /hour.  10 events occurred in REM sleep and 96 events in NREM. The REM AHI was 12.6 /hour, versus a non-REM AHI of 26.9. The patient spent 57.5 minutes of total sleep time in the supine position and 149 minutes in non-supine.. The supine AHI was 60.5 versus a non-supine AHI of 9.3.  OXYGEN SATURATION & C02:  The Wake baseline 02 saturation was 0%, with the lowest being 75%. Time spent below 89% saturation equaled 28 minutes.  PERIODIC LIMB MOVEMENTS: The patient had a total of 0 Periodic Limb Movements.  The Periodic Limb Movement (PLM) index was 0 and the PLM Arousal index was 0/hour.  Post-study, the patient indicated that sleep was the same as usual.   IMPRESSION:  1. Obstructive Sleep Apnea (OSA) 2. Dysfunctions associated with sleep stages or arousal from sleep  RECOMMENDATIONS:  1. This study demonstrates moderate to severe obstructive  sleep apnea, with a total AHI of 23.6/hour, REM AHI of 12.6/hour, supine AHI of 60.5/hour and O2 nadir of 75%. Treatment with positive airway pressure in the form of CPAP is recommended. This will require a full night titration study to optimize therapy. Other treatment options may include avoidance of supine sleep position along with weight loss, upper airway or jaw surgery in selected patients or the use of an oral  appliance in certain patients. ENT evaluation and/or consultation with a maxillofacial surgeon or dentist may be feasible in some instances.    2. Please note that untreated obstructive sleep apnea carries additional perioperative morbidity. Patients with significant obstructive sleep apnea should receive perioperative PAP therapy and the surgeons and particularly the anesthesiologist should be informed of the diagnosis and the severity of the sleep disordered breathing. 3. This study shows sleep fragmentation and abnormal sleep stage percentages; these are nonspecific findings and per se do not signify an intrinsic sleep disorder or a cause for the patient's sleep-related symptoms. Causes include (but are not limited to) the first night effect of the sleep study, circadian rhythm disturbances, medication effect or an underlying mood disorder or medical problem.  4. The patient should be cautioned not to drive, work at heights, or operate dangerous or heavy equipment when tired or sleepy. Review and reiteration of good sleep hygiene measures should be pursued with any patient. 5. The patient will be seen in follow-up by Dr. Rexene Alberts at [facility] for discussion of the test results and further management strategies. The referring provider will be notified of the test results.  I certify that I have reviewed the entire raw data recording prior to the issuance of this report in accordance with the Standards of Accreditation of the American Academy of Sleep Medicine (AASM)   Star Age, MD, PhD Diplomat, American Board of Psychiatry and Neurology (Neurology and Sleep Medicine)

## 2017-12-05 NOTE — Progress Notes (Signed)
Patient referred by Dr. Dennard Schaumann, seen by me on 09/06/17, diagnostic PSG on 11/29/17.    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 I recommend treatment for this in the form of CPAP. This will require a repeat sleep study for proper titration and mask fitting. Please explain to patient and arrange for a CPAP titration study. I have placed an order in the chart. Thanks.  Star Age, MD, PhD [facility])

## 2017-12-06 ENCOUNTER — Telehealth: Payer: Self-pay

## 2017-12-06 NOTE — Telephone Encounter (Signed)
I called pt to discuss. No answer, left a message asking him to call me back. 

## 2017-12-06 NOTE — Telephone Encounter (Signed)
-----   Message from Star Age, MD sent at 12/05/2017  8:17 AM EST ----- Patient referred by Dr. Dennard Schaumann, seen by me on 09/06/17, diagnostic PSG on 11/29/17.    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 I recommend treatment for this in the form of CPAP. This will require a repeat sleep study for proper titration and mask fitting. Please explain to patient and arrange for a CPAP titration study. I have placed an order in the chart. Thanks.  Star Age, MD, PhD [facility])

## 2017-12-10 NOTE — Telephone Encounter (Signed)
I called pt again to discuss his sleep study results. Pt's home number is not in service and I left a message at his cell phone number asking him to call me back.

## 2017-12-11 NOTE — Telephone Encounter (Signed)
Called the patient back and went over the sleep study results.  I advised pt that Dr. Rexene Alberts reviewed their sleep study results and found that has moderate to severe sleep apnea and recommends that pt be treated with a cpap. Dr. Rexene Alberts recommends that pt return for a repeat sleep study in order to properly titrate the cpap and ensure a good mask fit. Pt is agreeable to returning for a titration study. I advised pt that our sleep lab will file with pt's insurance and call pt to schedule the sleep study when we hear back from the pt's insurance regarding coverage of this sleep study. Pt verbalized understanding of results. Pt had no questions at this time but was encouraged to call back if questions arise. Please call him at updated number [phone].i will update this on the chart as well

## 2017-12-11 NOTE — Telephone Encounter (Signed)
Pt is returning call, can be reached at [phone]

## 2018-01-24 ENCOUNTER — Ambulatory Visit (INDEPENDENT_AMBULATORY_CARE_PROVIDER_SITE_OTHER): Payer: BC Managed Care – PPO | Admitting: Neurology

## 2018-01-24 DIAGNOSIS — G4761 Periodic limb movement disorder: Secondary | ICD-10-CM

## 2018-01-24 DIAGNOSIS — G4733 Obstructive sleep apnea (adult) (pediatric): Secondary | ICD-10-CM | POA: Diagnosis not present

## 2018-01-24 DIAGNOSIS — G4720 Circadian rhythm sleep disorder, unspecified type: Secondary | ICD-10-CM

## 2018-01-24 DIAGNOSIS — E669 Obesity, unspecified: Secondary | ICD-10-CM

## 2018-01-29 ENCOUNTER — Telehealth: Payer: Self-pay

## 2018-01-29 ENCOUNTER — Other Ambulatory Visit: Payer: Self-pay | Admitting: Neurology

## 2018-01-29 DIAGNOSIS — G4733 Obstructive sleep apnea (adult) (pediatric): Secondary | ICD-10-CM

## 2018-01-29 NOTE — Procedures (Signed)
PATIENT'S NAME:  Joshua Dominguez, Joshua Dominguez DOB:      15-Dec-1940      MR#:    [id number]     DATE OF RECORDING: 01/24/2018 REFERRING M.D.:  Jenna Luo, MD Study Performed:   CPAP  Titration HISTORY: 77 year old man with a history of allergic rhinitis, gout, hemorrhoids, history of prostatitis, history of prostate cancer, degenerative lumbar spine disease, erectile dysfunction, status post multiple surgeries including hydrocele correction, appendectomy, cataract extractions, arthroscopic knee surgery on the left, radioactive seed implants, and history of obesity, who presents for a CPAP titration study. His diagnostic PSG on 11/29/17 revealed moderate to severe obstructive sleep apnea, with a total AHI of 23.6/hour, REM AHI of 12.6/hour, supine AHI of 60.5/hour and O2 nadir of 75%. The patient endorsed the Epworth Sleepiness Scale at 6 points. The patient's weight 269 pounds with a height of 77 (inches), resulting in a BMI of 31.8 kg/m2. The patient's neck circumference measured 18.25 inches.  CURRENT MEDICATIONS: Elavil, Colchicine, Neurontin, Diclofenac, Uloric  PROCEDURE:  This is a multichannel digital polysomnogram utilizing the SomnoStar 11.2 system.  Electrodes and sensors were applied and monitored per AASM Specifications.   EEG, EOG, Chin and Limb EMG, were sampled at 200 Hz.  ECG, Snore and Nasal Pressure, Thermal Airflow, Respiratory Effort, CPAP Flow and Pressure, Oximetry was sampled at 50 Hz. Digital video and audio were recorded.      The patient was fitted with a large Eson 2 nasal mask. CPAP was initiated at 5 cmH20 with heated humidity per AASM split night standards and pressure was advanced to 9 cmH20 because of hypopneas, apneas and desaturations.  At a PAP pressure of 9 cmH20, there was a reduction of the AHI to 0/hour with supine NREM sleep achieved and O2 nadir of 94%.     Lights Out was at 22:32 and Lights On at 05:00. Total recording time (TRT) was 388 minutes, with a total sleep time  (TST) of 322 minutes. The patient's sleep latency was 55 minutes, which is delayed. REM latency was 67 minutes.  The sleep efficiency was 83%.    SLEEP ARCHITECTURE: WASO (Wake after sleep onset) was 29 minutes with overall mild sleep fragmentation noted. There were 18.5 minutes in Stage N1, 277.5 minutes Stage N2, 0 minutes Stage N3 and 26 minutes in Stage REM.  The percentage of Stage N1 was 5.7%, Stage N2 was 86.2%, which is markedly increased, Stage N3 was absent, and Stage R (REM sleep) was 8.1%, which is reduced. The arousals were noted as: 22 were spontaneous, 2 were associated with PLMs, 10 were associated with respiratory events.  Audio and video analysis did not show any abnormal or unusual movements, behaviors, phonations or vocalizations. The patient took no bathroom breaks. The EKG was in keeping with normal sinus rhythm (NSR).  RESPIRATORY ANALYSIS:  There was a total of 16 respiratory events: 10 obstructive apneas, 0 central apneas and 1 mixed apneas with a total of 11 apneas and an apnea index (AI) of 2. /hour. There were 5 hypopneas with a hypopnea index of .9/hour. The patient also had 0 respiratory event related arousals (RERAs).      The total APNEA/HYPOPNEA INDEX  (AHI) was 3. /hour and the total RESPIRATORY DISTURBANCE INDEX was 3. .hour  3 events occurred in REM sleep and 13 events in NREM. The REM AHI was 6.9 /hour versus a non-REM AHI of 2.6 /hour.  The patient spent 253 minutes of total sleep time in the supine position and 69 minutes  in non-supine. The supine AHI was 3.8, versus a non-supine AHI of 0.0.  OXYGEN SATURATION & C02:  The baseline 02 saturation was 96%, with the lowest being 87%. Time spent below 89% saturation equaled 2 minutes.  PERIODIC LIMB MOVEMENTS: The patient had a total of 255 Periodic Limb Movements. The Periodic Limb Movement (PLM) index was 47.5 and the PLM Arousal index was .4 /hour.  Post-study, the patient indicated that sleep was worse than usual.    IMPRESSION: 1. Obstructive Sleep Apnea (OSA) 2. Periodic limb movement disorder 3. Dysfunctions associated with sleep stages or arousal from sleep   RECOMMENDATIONS: 1. This study demonstrates resolution of the patient's obstructive sleep apnea with CPAP therapy. I will, therefore, start the patient on home CPAP treatment at a pressure of 9 cm via large nasal mask with heated humidity. The patient should be reminded to be fully compliant with PAP therapy to improve sleep related symptoms and decrease long term cardiovascular risks. The patient should be reminded, that it may take up to 3 months to get fully used to using PAP with all planned sleep. The earlier full compliance is achieved, the better long term compliance tends to be. Please note that untreated obstructive sleep apnea carries additional perioperative morbidity. Patients with significant obstructive sleep apnea should receive perioperative PAP therapy and the surgeons and particularly the anesthesiologist should be informed of the diagnosis and the severity of the sleep disordered breathing. 2. Moderate PLMs (periodic limb movements of sleep) were noted during this study with no significant arousals; clinical correlation is recommended. Medication effect from the antidepressant medication should be considered.  3. This study shows sleep fragmentation and abnormal sleep stage percentages; these are nonspecific findings and per se do not signify an intrinsic sleep disorder or a cause for the patient's sleep-related symptoms. Causes include (but are not limited to) the first night effect of the sleep study, circadian rhythm disturbances, medication effect or an underlying mood disorder or medical problem.  4. The patient should be cautioned not to drive, work at heights, or operate dangerous or heavy equipment when tired or sleepy. Review and reiteration of good sleep hygiene measures should be pursued with any patient. 5. The patient will  be seen in follow-up by Dr. Rexene Alberts at [facility] for discussion of the test results and further management strategies. The referring provider will be notified of the test results.   I certify that I have reviewed the entire raw data recording prior to the issuance of this report in accordance with the Standards of Accreditation of the American Academy of Sleep Medicine (AASM)   Star Age, MD, PhD Diplomat, American Board of Psychiatry and Neurology (Neurology and Sleep Medicine)

## 2018-01-29 NOTE — Telephone Encounter (Signed)
I called pt. I advised pt that Dr. Rexene Alberts reviewed their sleep study results and found that pt did well with the cpap during his study. Dr. Rexene Alberts recommends that pt start a cpap at home. I reviewed PAP compliance expectations with the pt. Pt is agreeable to starting a CPAP. I advised pt that an order will be sent to a DME, [facility], and [facility] will call the pt within about one week after they file with the pt's insurance. [facility] will show the pt how to use the machine, fit for masks, and troubleshoot the CPAP if needed. A follow up appt was made for insurance purposes with Dr. Rexene Alberts on June 6th, 2019 at 8:30am. Pt verbalized understanding to arrive 15 minutes early and bring their CPAP. A letter with all of this information in it will be sent to the pt's mychart account as a reminder. I verified with the pt that the address we have on file is correct. Pt verbalized understanding of results. Pt had no questions at this time but was encouraged to call back if questions arise.

## 2018-01-29 NOTE — Telephone Encounter (Signed)
-----   Message from Star Age, MD sent at 01/29/2018  8:26 AM EDT ----- Patient referred by Dr. Dennard Schaumann, seen by me on 09/06/17, diagnostic PSG on 11/29/17. Patient had a CPAP titration study on 01/24/18.  Please call and inform patient that I have entered an order for treatment with positive airway pressure (PAP) treatment for obstructive sleep apnea (OSA). He did well during the latest sleep study with CPAP. We will, therefore, arrange for a machine for home use through a DME (durable medical equipment) company of His choice; and I will see the patient back in follow-up in about 10 weeks. Please also explain to the patient that I will be looking out for compliance data, which can be downloaded from the machine (stored on an SD card, that is inserted in the machine) or via remote access through a modem, that is built into the machine. At the time of the followup appointment we will discuss sleep study results and how it is going with PAP treatment at home. Please advise patient to bring His machine at the time of the first FU visit, even though this is cumbersome. Bringing the machine for every visit after that will likely not be needed, but often helps for the first visit to troubleshoot if needed. Please re-enforce the importance of compliance with treatment and the need for [place] to monitor compliance data - often an insurance requirement and actually good feedback for the patient as far as how they are doing.  Also remind patient, that any interim PAP machine or mask issues should be first addressed with the DME company, as they can often help better with technical and mask fit issues. Please ask if patient has a preference regarding DME company.  Please also make sure, the patient has a follow-up appointment with me in about 10 weeks from the setup date, thanks. May see one of our nurse practitioners if needed for proper timing of the FU appointment.  Please fax or rout report to the referring provider.  Thanks,   Star Age, MD, PhD [facility])

## 2018-01-29 NOTE — Progress Notes (Signed)
Patient referred by Dr. Dennard Schaumann, seen by me on 09/06/17, diagnostic PSG on 11/29/17. Patient had a CPAP titration study on 01/24/18.  Please call and inform patient that I have entered an order for treatment with positive airway pressure (PAP) treatment for obstructive sleep apnea (OSA). He did well during the latest sleep study with CPAP. We will, therefore, arrange for a machine for home use through a DME (durable medical equipment) company of His choice; and I will see the patient back in follow-up in about 10 weeks. Please also explain to the patient that I will be looking out for compliance data, which can be downloaded from the machine (stored on an SD card, that is inserted in the machine) or via remote access through a modem, that is built into the machine. At the time of the followup appointment we will discuss sleep study results and how it is going with PAP treatment at home. Please advise patient to bring His machine at the time of the first FU visit, even though this is cumbersome. Bringing the machine for every visit after that will likely not be needed, but often helps for the first visit to troubleshoot if needed. Please re-enforce the importance of compliance with treatment and the need for [place] to monitor compliance data - often an insurance requirement and actually good feedback for the patient as far as how they are doing.  Also remind patient, that any interim PAP machine or mask issues should be first addressed with the DME company, as they can often help better with technical and mask fit issues. Please ask if patient has a preference regarding DME company.  Please also make sure, the patient has a follow-up appointment with me in about 10 weeks from the setup date, thanks. May see one of our nurse practitioners if needed for proper timing of the FU appointment.  Please fax or rout report to the referring provider. Thanks,   Star Age, MD, PhD [facility])

## 2018-02-07 ENCOUNTER — Other Ambulatory Visit: Payer: BC Managed Care – PPO

## 2018-02-07 DIAGNOSIS — R030 Elevated blood-pressure reading, without diagnosis of hypertension: Secondary | ICD-10-CM

## 2018-02-07 DIAGNOSIS — Z0000 Encounter for general adult medical examination without abnormal findings: Secondary | ICD-10-CM

## 2018-02-07 DIAGNOSIS — Z125 Encounter for screening for malignant neoplasm of prostate: Secondary | ICD-10-CM

## 2018-02-07 LAB — CBC WITH DIFFERENTIAL/PLATELET
BASOS ABS: 58 {cells}/uL (ref 0–200)
Basophils Relative: 1 %
EOS PCT: 1.9 %
Eosinophils Absolute: 110 cells/uL (ref 15–500)
HEMATOCRIT: 46.4 % (ref 38.5–50.0)
Hemoglobin: 15.8 g/dL (ref 13.2–17.1)
LYMPHS ABS: 2204 {cells}/uL (ref 850–3900)
MCH: 31.1 pg (ref 27.0–33.0)
MCHC: 34.1 g/dL (ref 32.0–36.0)
MCV: 91.3 fL (ref 80.0–100.0)
MONOS PCT: 8.4 %
MPV: 9.6 fL (ref 7.5–12.5)
NEUTROS PCT: 50.7 %
Neutro Abs: 2941 cells/uL (ref 1500–7800)
Platelets: 203 10*3/uL (ref 140–400)
RBC: 5.08 10*6/uL (ref 4.20–5.80)
RDW: 12.4 % (ref 11.0–15.0)
Total Lymphocyte: 38 %
WBC mixed population: 487 cells/uL (ref 200–950)
WBC: 5.8 10*3/uL (ref 3.8–10.8)

## 2018-02-07 LAB — COMPREHENSIVE METABOLIC PANEL
AG Ratio: 1.7 (calc) (ref 1.0–2.5)
ALT: 28 U/L (ref 9–46)
AST: 28 U/L (ref 10–35)
Albumin: 4.3 g/dL (ref 3.6–5.1)
Alkaline phosphatase (APISO): 64 U/L (ref 40–115)
BILIRUBIN TOTAL: 0.8 mg/dL (ref 0.2–1.2)
BUN/Creatinine Ratio: 24 (calc) — ABNORMAL HIGH (ref 6–22)
BUN: 28 mg/dL — ABNORMAL HIGH (ref 7–25)
CALCIUM: 9.5 mg/dL (ref 8.6–10.3)
CO2: 30 mmol/L (ref 20–32)
Chloride: 106 mmol/L (ref 98–110)
Creat: 1.16 mg/dL (ref 0.70–1.18)
Globulin: 2.5 g/dL (calc) (ref 1.9–3.7)
Glucose, Bld: 80 mg/dL (ref 65–99)
Potassium: 4.7 mmol/L (ref 3.5–5.3)
SODIUM: 141 mmol/L (ref 135–146)
TOTAL PROTEIN: 6.8 g/dL (ref 6.1–8.1)

## 2018-02-07 LAB — PSA: PSA: 0.1 ng/mL (ref <=4.0)

## 2018-02-07 LAB — LIPID PANEL
Cholesterol: 153 mg/dL (ref <200)
HDL: 31 mg/dL — AB (ref >40)
LDL CHOLESTEROL (CALC): 103 mg/dL — AB
Non-HDL Cholesterol (Calc): 122 mg/dL (calc) (ref <130)
TRIGLYCERIDES: 93 mg/dL (ref <150)
Total CHOL/HDL Ratio: 4.9 (calc) (ref <5.0)

## 2018-02-14 ENCOUNTER — Ambulatory Visit (INDEPENDENT_AMBULATORY_CARE_PROVIDER_SITE_OTHER): Payer: BC Managed Care – PPO | Admitting: Family Medicine

## 2018-02-14 ENCOUNTER — Encounter: Payer: Self-pay | Admitting: Family Medicine

## 2018-02-14 VITALS — BP 122/80 | HR 62 | Temp 97.6°F | Resp 14 | Ht 77.0 in | Wt 265.0 lb

## 2018-02-14 DIAGNOSIS — Z0000 Encounter for general adult medical examination without abnormal findings: Secondary | ICD-10-CM | POA: Diagnosis not present

## 2018-02-14 DIAGNOSIS — Z8739 Personal history of other diseases of the musculoskeletal system and connective tissue: Secondary | ICD-10-CM

## 2018-02-14 DIAGNOSIS — E786 Lipoprotein deficiency: Secondary | ICD-10-CM | POA: Diagnosis not present

## 2018-02-14 DIAGNOSIS — Z8546 Personal history of malignant neoplasm of prostate: Secondary | ICD-10-CM

## 2018-02-14 MED ORDER — ATORVASTATIN CALCIUM 10 MG PO TABS: 10.0000 mg | ORAL_TABLET | Freq: Every day | ORAL | 3 refills | Status: DC

## 2018-02-14 NOTE — Progress Notes (Signed)
Subjective:    Patient ID: Joshua Dominguez, male    DOB: 1941-08-14, 77 y.o.   MRN: [id number]  HPI Patient is here today for complete physical exam. His last colonoscopy was in 2010 and is due again next year at age 81. He is undergone radioactive seed implantation in 2014 for prostate cancer. His immunizations are up-to-date including Prevnar, Pneumovax, Zostavax, and Tdap.  He is due for Shingrix.  Immunization records are listed below.  Immunization History  Administered Date(s) Administered  . Influenza Split 08/22/2011, 06/13/2013  . Influenza Whole 08/16/2010  . Influenza-Unspecified 07/20/2016, 06/27/2017  . Pneumococcal Conjugate-13 12/11/2013  . Pneumococcal Polysaccharide-23 09/13/2004, 06/14/2010, 11/27/2011  . Td 04/18/2005  . Tdap 02/01/2016  . Zoster 06/05/2011   Most recent lab work as listed below: Lab on 02/07/2018  Component Date Value Ref Range Status  . WBC 02/07/2018 5.8  3.8 - 10.8 Thousand/uL Final  . RBC 02/07/2018 5.08  4.20 - 5.80 Million/uL Final  . Hemoglobin 02/07/2018 15.8  13.2 - 17.1 g/dL Final  . HCT 02/07/2018 46.4  38.5 - 50.0 % Final  . MCV 02/07/2018 91.3  80.0 - 100.0 fL Final  . MCH 02/07/2018 31.1  27.0 - 33.0 pg Final  . MCHC 02/07/2018 34.1  32.0 - 36.0 g/dL Final  . RDW 02/07/2018 12.4  11.0 - 15.0 % Final  . Platelets 02/07/2018 203  140 - 400 Thousand/uL Final  . MPV 02/07/2018 9.6  7.5 - 12.5 fL Final  . Neutro Abs 02/07/2018 2,941  1,500 - 7,800 cells/uL Final  . Lymphs Abs 02/07/2018 2,204  850 - 3,900 cells/uL Final  . WBC mixed population 02/07/2018 487  200 - 950 cells/uL Final  . Eosinophils Absolute 02/07/2018 110  15 - 500 cells/uL Final  . Basophils Absolute 02/07/2018 58  0 - 200 cells/uL Final  . Neutrophils Relative % 02/07/2018 50.7  % Final  . Total Lymphocyte 02/07/2018 38.0  % Final  . Monocytes Relative 02/07/2018 8.4  % Final  . Eosinophils Relative 02/07/2018 1.9  % Final  . Basophils Relative 02/07/2018 1.0   % Final  . Glucose, Bld 02/07/2018 80  65 - 99 mg/dL Final   Comment: .            Fasting reference interval .   . BUN 02/07/2018 28* 7 - 25 mg/dL Final  . Creat 02/07/2018 1.16  0.70 - 1.18 mg/dL Final   Comment: For patients >41 years of age, the reference limit for Creatinine is approximately 13% higher for people identified as African-American. .   Havery Moros Ratio 02/07/2018 24* 6 - 22 (calc) Final  . Sodium 02/07/2018 141  135 - 146 mmol/L Final  . Potassium 02/07/2018 4.7  3.5 - 5.3 mmol/L Final  . Chloride 02/07/2018 106  98 - 110 mmol/L Final  . CO2 02/07/2018 30  20 - 32 mmol/L Final  . Calcium 02/07/2018 9.5  8.6 - 10.3 mg/dL Final  . Total Protein 02/07/2018 6.8  6.1 - 8.1 g/dL Final  . Albumin 02/07/2018 4.3  3.6 - 5.1 g/dL Final  . Globulin 02/07/2018 2.5  1.9 - 3.7 g/dL (calc) Final  . AG Ratio 02/07/2018 1.7  1.0 - 2.5 (calc) Final  . Total Bilirubin 02/07/2018 0.8  0.2 - 1.2 mg/dL Final  . Alkaline phosphatase (APISO) 02/07/2018 64  40 - 115 U/L Final  . AST 02/07/2018 28  10 - 35 U/L Final  . ALT 02/07/2018 28  9 - 46  U/L Final  . Cholesterol 02/07/2018 153  <200 mg/dL Final  . HDL 02/07/2018 31* >40 mg/dL Final  . Triglycerides 02/07/2018 93  <150 mg/dL Final  . LDL Cholesterol (Calc) 02/07/2018 103* mg/dL (calc) Final   Comment: Reference range: <100 . Desirable range <100 mg/dL for primary prevention;   <70 mg/dL for patients with CHD or diabetic patients  with > or = 2 CHD risk factors. Marland Kitchen LDL-C is now calculated using the Martin-Hopkins  calculation, which is a validated novel method providing  better accuracy than the Friedewald equation in the  estimation of LDL-C.  Cresenciano Genre et al. Annamaria Helling. 9379;024(09): 2061-2068  (http://education.[facility].com/faq/FAQ164)   . Total CHOL/HDL Ratio 02/07/2018 4.9  <5.0 (calc) Final  . Non-HDL Cholesterol (Calc) 02/07/2018 122  <130 mg/dL (calc) Final   Comment: For patients with diabetes plus 1 major  ASCVD risk  factor, treating to a non-HDL-C goal of <100 mg/dL  (LDL-C of <70 mg/dL) is considered a therapeutic  option.   Marland Kitchen PSA 02/07/2018 0.1  < OR = 4.0 ng/mL Final   Comment: The total PSA value from this assay system is  standardized against the WHO standard. The test  result will be approximately 20% lower when compared  to the equimolar-standardized total PSA (Beckman  Coulter). Comparison of serial PSA results should be  interpreted with this fact in mind. . This test was performed using the Siemens  chemiluminescent method. Values obtained from  different assay methods cannot be used interchangeably. PSA levels, regardless of value, should not be interpreted as absolute evidence of the presence or absence of disease.   Calculated with the patient his 10-year risk of cardiovascular disease and found to be 23% primarily due to his low HDL along with age of 75 years.  We discussed the new data regarding initiation of statins with a 10-year risk greater than 7.5%.  I estimate that his risk reduction can be up to 10% after we start a statin depending on the drop in his cholesterol.  Past Medical History:  Diagnosis Date  . Allergic rhinitis   . Bilateral hydrocele   . DDD (degenerative disc disease), lumbar     L3-L4  . ED (erectile dysfunction) of organic origin   . Gout   . Hemorrhoids   . History of prostatitis   . Lumbar herniated disc 2007   L4 and L5 did not require surgery  . Prostate cancer ([place])    S/P  RADIOACTIVE SEED IMPLANTS 05-09-2013   Past Surgical History:  Procedure Laterality Date  . APPENDECTOMY  FEB 2000  . CATARACT EXTRACTION W/ INTRAOCULAR LENS  IMPLANT, BILATERAL    . HYDROCELE EXCISION Bilateral 01/02/2014   Procedure: BILATERAL HYDROCELECTOMY;  Surgeon: Claybon Jabs, MD;  Location: [facility];  Service: Urology;  Laterality: Bilateral;  . HYDROCELE EXCISION Left 04/17/2014   Procedure: LEFT HYDROCELECTOMY ADULT;  Surgeon: Claybon Jabs, MD;  Location: [facility];  Service: Urology;  Laterality: Left;  . KNEE ARTHROSCOPY Left   . RADIOACTIVE SEED IMPLANT N/A 05/09/2013   Procedure: RADIOACTIVE SEED IMPLANT;  Surgeon: Claybon Jabs, MD;  Location: [facility];  Service: Urology;  Laterality: N/A;   Current Outpatient Medications on File Prior to Visit  Medication Sig Dispense Refill  . amitriptyline (ELAVIL) 50 MG tablet Take 1 tablet (50 mg total) by mouth at bedtime. 90 tablet 3  . colchicine 0.6 MG tablet Take two by mouth now then one by  mouth in one hour if no better. (Patient taking differently: Take two by mouth now then one by mouth in one hour if no better. PRN) 10 tablet 0  . Diclofenac Sodium 2 % SOLN Place onto the skin daily.    . Febuxostat (ULORIC) 80 MG TABS Take 1 tablet (80 mg total) by mouth daily. 90 tablet 3  . gabapentin (NEURONTIN) 300 MG capsule take 3 capsules by mouth every morning AND 2 EVERY EVENING AS DIRECTED 450 capsule 3  . glucosamine-chondroitin 500-400 MG tablet Take 1 tablet by mouth 2 (two) times daily.     . Multiple Vitamin (MULTIVITAMIN) tablet Take 1 tablet by mouth daily.     . Omega-3 Fatty Acids (FISH OIL) 1200 MG CAPS Take 1 capsule by mouth 2 (two) times daily.      No current facility-administered medications on file prior to visit.    Allergies  Allergen Reactions  . Celebrex [Celecoxib] Rash  . Oxymetazoline Hcl Rash   Social History   Socioeconomic History  . Marital status: Married    Spouse name: Not on file  . Number of children: Not on file  . Years of education: Not on file  . Highest education level: Not on file  Occupational History  . Occupation: Consulting/professor  Social Needs  . Financial resource strain: Not on file  . Food insecurity:    Worry: Not on file    Inability: Not on file  . Transportation needs:    Medical: Not on file    Non-medical: Not on file  Tobacco Use  . Smoking status: Former Smoker      Types: Pipe    Last attempt to quit: 11/13/1976    Years since quitting: 41.2  . Smokeless tobacco: Never Used  Substance and Sexual Activity  . Alcohol use: Yes    Alcohol/week: 8.4 oz    Types: 14 Glasses of wine per week  . Drug use: No  . Sexual activity: Not on file  Lifestyle  . Physical activity:    Days per week: Not on file    Minutes per session: Not on file  . Stress: Not on file  Relationships  . Social connections:    Talks on phone: Not on file    Gets together: Not on file    Attends religious service: Not on file    Active member of club or organization: Not on file    Attends meetings of clubs or organizations: Not on file    Relationship status: Not on file  . Intimate partner violence:    Fear of current or ex partner: Not on file    Emotionally abused: Not on file    Physically abused: Not on file    Forced sexual activity: Not on file  Other Topics Concern  . 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 Hypertension Father   . Gout Father   . Prostate cancer Father   . Lung cancer Mother        +smoker  . Lung cancer Maternal Grandfather        -smoker  . Heart disease Sister   . Heart disease Brother  Review of Systems  All other systems reviewed and are negative.      Objective:   Physical Exam  Constitutional: He is oriented to person, place, and time. He appears well-developed and well-nourished. No distress.  HENT:  Head: Normocephalic and atraumatic.  Right Ear: External ear normal.  Left Ear: External ear normal.  Nose: Nose normal.  Mouth/Throat: Oropharynx is clear and moist. No oropharyngeal exudate.  Eyes: Pupils are equal, round, and  reactive to light. Conjunctivae and EOM are normal. Right eye exhibits no discharge. Left eye exhibits no discharge. No scleral icterus.  Neck: Normal range of motion. Neck supple. No JVD present. No thyromegaly present.  Cardiovascular: Normal rate, regular rhythm and normal heart sounds. Exam reveals no gallop and no friction rub.  No murmur heard. Pulmonary/Chest: Effort normal and breath sounds normal. No stridor. No respiratory distress. He has no wheezes. He has no rales. He exhibits no tenderness.  Abdominal: Soft. Bowel sounds are normal. He exhibits no distension and no mass. There is no tenderness. There is no rebound and no guarding.  Musculoskeletal: Normal range of motion. He exhibits no edema or tenderness.  Lymphadenopathy:    He has no cervical adenopathy.  Neurological: He is alert and oriented to person, place, and time. He has normal reflexes. No cranial nerve deficit. He exhibits normal muscle tone. Coordination normal.  Skin: Skin is warm. No rash noted. He is not diaphoretic. No erythema. No pallor.  Psychiatric: He has a normal mood and affect. His behavior is normal. Judgment and thought content normal.  Vitals reviewed.         Assessment & Plan:  General medical exam  History of gout  Personal history of prostate cancer  Low HDL (under 40)  Physical exam is completely normal.  The patient's PSA has not changed essentially in 3 years and shows low risk for prostate cancer recurrence.  His 10-year risk of cardiovascular disease is over 23%.  I have recommended starting Lipitor 10 mg a day and rechecking lab work in 6 months to monitor his cholesterol along with his liver function test.  We did discuss the new black box warning for uloric and increasing risk of myocardial infarction.  Patient elects to stay on this medication as he is failed allopurinol in the past.  Patient will be due for colonoscopy next year.  I have recommended Shingrix and the patient will  consider the price prior to receiving.  Patient is an extremely fit 76 years.  He exercises and lifts weights every day and runs 3 days a week.  I encouraged him to continue this healthy lifestyle.  He is using NSAIDs almost on a regular basis due to chronic foot pain.  We discussed the risk of stomach ulcers and cardiovascular disease.  I have recommended taking Zantac on a daily basis if he continues to use Aleve on a daily basis to reduce his risk of ulcers.

## 2018-04-11 ENCOUNTER — Ambulatory Visit: Payer: BC Managed Care – PPO | Admitting: Neurology

## 2018-04-11 ENCOUNTER — Encounter: Payer: Self-pay | Admitting: Neurology

## 2018-04-11 VITALS — BP 135/81 | HR 68 | Ht 77.0 in | Wt 264.0 lb

## 2018-04-11 DIAGNOSIS — G4733 Obstructive sleep apnea (adult) (pediatric): Secondary | ICD-10-CM

## 2018-04-11 DIAGNOSIS — Z9989 Dependence on other enabling machines and devices: Secondary | ICD-10-CM | POA: Diagnosis not present

## 2018-04-11 NOTE — Patient Instructions (Addendum)

## 2018-04-11 NOTE — Progress Notes (Signed)
Subjective:    Patient ID: Joshua Dominguez is a 77 y.o. male.  HPI     Interim history:   Joshua Dominguez is a 77 year old right-handed gentleman with an underlying medical history of allergic rhinitis, gout, hemorrhoids, history of prostatitis, history of prostate cancer, degenerative lumbar spine disease, erectile dysfunction, status post multiple surgeries including hydrocele correction, appendectomy, cataract extractions, arthroscopic knee surgery on the left, radioactive seed implants, and history of obesity, who presents for follow-up consultation of his obstructive sleep apnea, after sleep study testing and starting CPAP therapy. The patient is unaccompanied today. I first met him on 09/06/2017 at the request of his primary care physician, at which time he reported snoring and witnessed apneas. He was advised to proceed with sleep study testing. He had a baseline sleep study, followed by a CPAP titration study. His baseline sleep study from 11/29/2017 showed a sleep latency of 10.5 minutes, REM latency was delayed and sleep efficiency was 80.8%, he had an increased percentage of stage I sleep. Total AHI was 23.6 per hour, supine AHI was 60.5 per hour, average oxygen saturation was in the mid to lower 90s, lowest desaturation was 75%. He did not have any significant PLMS. Based on his test results he was advised to proceed with a full night CPAP titration study. He had this on 01/24/2018. Sleep latency was 55 minutes, REM latency was 67 minutes, sleep efficiency was 83%. He had a markedly increased percentage of stage II sleep, absence of slow-wave sleep and REM sleep was reduced at 8.1%. He was fitted with a basal mask and CPAP was initiated at 5 cm, advanced 9 cm, on the final pressure his AHI was 0 per hour with supine NREM sleep achieved and O2 nadir of 94%. Based on his test results I prescribed CPAP therapy for home use at a pressure of 9 cm.  Today, 04/11/2018: I reviewed his CPAP compliance  data from 03/11/2018 through 04/09/2018 which is a total of 30 days, during which time he used his CPAP every night with percent used days greater than 4 hours at 90%, indicating excellent compliance with an average usage of 6 hours and 39 minutes, residual AHI at goal at 3 per hour, leak on the high side with the 95th percentile at 26 L/m on a pressure of 9 cm with EPR of 3. He reports not feeling a telltale difference after starting CPAP therapy. He is not to a point where he has adapted well to it, he still struggling with tolerance. Uses a nasal mask. His wife can sleep in the same bed, but still notices some snoring. He may be less restless during sleep. He does not Endorse telltale symptoms of restless leg syndrome, he is not bothered by leg twitching or movements at night, wife has not reported any either.  The patient's allergies, current medications, family history, past medical history, past social history, past surgical history and problem list were reviewed and updated as appropriate.   Previously (copied from previous notes for reference):   09/06/2017: (He) reports snoring and witnessed apneas per wife's report. I reviewed your office note from 06/14/2017.  In the past, some 10 years ago, he had RLS symptoms, but not so much not so much any more. He is a runner. He works out routinely. He has peripheral neuropathy, had EMG in 8/17. He is a professor at [employer]. He has 2 sons and 2 grandsons.  His Epworth sleepiness score is 6 out of 24 today, fatigue score  is 16 out of 63. He has L foot pain.  His bedtime is around 11 PM, wakeup time around 7:30. He sometimes takes a nap. He has no night to night Nocturia and denies morning headaches.He has no family history of obstructive sleep apnea.He quit smoking over 50 years ago. He drinks alcohol in the form of wine, usually 2 glasses per day. He drinks caffeine in the form of coffee, one cup per day on average.   His Past Medical History Is  Significant For: Past Medical History:  Diagnosis Date  . Allergic rhinitis   . Bilateral hydrocele   . DDD (degenerative disc disease), lumbar     L3-L4  . ED (erectile dysfunction) of organic origin   . Gout   . Hemorrhoids   . History of prostatitis   . Lumbar herniated disc 2007   L4 and L5 did not require surgery  . Prostate cancer (HCC)    S/P  RADIOACTIVE SEED IMPLANTS 05-09-2013    His Past Surgical History Is Significant For: Past Surgical History:  Procedure Laterality Date  . APPENDECTOMY  FEB 2000  . CATARACT EXTRACTION W/ INTRAOCULAR LENS  IMPLANT, BILATERAL    . HYDROCELE EXCISION Bilateral 01/02/2014   Procedure: BILATERAL HYDROCELECTOMY;  Surgeon: Mark C Ottelin, MD;  Location: [facility];  Service: Urology;  Laterality: Bilateral;  . HYDROCELE EXCISION Left 04/17/2014   Procedure: LEFT HYDROCELECTOMY ADULT;  Surgeon: Mark C Ottelin, MD;  Location: [facility];  Service: Urology;  Laterality: Left;  . KNEE ARTHROSCOPY Left   . RADIOACTIVE SEED IMPLANT N/A 05/09/2013   Procedure: RADIOACTIVE SEED IMPLANT;  Surgeon: Mark C Ottelin, MD;  Location: [facility];  Service: Urology;  Laterality: N/A;    His Family History Is Significant For: Family History  Problem Relation Age of Onset  . Hypertension Father   . Gout Father   . Prostate cancer Father   . Lung cancer Mother        +smoker  . Lung cancer Maternal Grandfather        -smoker  . Heart disease Sister   . Heart disease Brother     His Social History Is Significant For: Social History   Socioeconomic History  . Marital status: Married    Spouse name: Not on file  . Number of children: Not on file  . Years of education: Not on file  . Highest education level: Not on file  Occupational History  . Occupation: Consulting/professor  Social Needs  . Financial resource strain: Not on file  . Food insecurity:    Worry: Not on file    Inability: Not on  file  . Transportation needs:    Medical: Not on file    Non-medical: Not on file  Tobacco Use  . Smoking status: Former Smoker    Types: Pipe    Last attempt to quit: 11/13/1976    Years since quitting: 41.4  . Smokeless tobacco: Never Used  Substance and Sexual Activity  . Alcohol use: Yes    Alcohol/week: 8.4 oz    Types: 14 Glasses of wine per week  . Drug use: No  . Sexual activity: Not on file  Lifestyle  . Physical activity:    Days per week: Not on file    Minutes per session: Not on file  . Stress: Not on file  Relationships  . Social connections:    Talks on phone: Not on file    Gets   together: Not on file    Attends religious service: Not on file    Active member of club or organization: Not on file    Attends meetings of clubs or organizations: Not on file    Relationship status: Not on file  Other Topics Concern  . 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 Celebrex [Celecoxib] Rash  . Oxymetazoline Hcl Rash  :   His Current Medications Are:  Outpatient Encounter Medications as of 04/11/2018  Medication Sig  . amitriptyline (ELAVIL) 50 MG tablet Take 1 tablet (50 mg total) by mouth at bedtime.  . colchicine 0.6 MG tablet Take two by mouth now then one by mouth in one hour if no better. (Patient taking differently: Take two by mouth now then one by mouth in one hour if no better. PRN)  . Diclofenac Sodium 2 % SOLN Place onto the skin daily.  . gabapentin (NEURONTIN) 300 MG capsule take 3 capsules by mouth every morning AND 2 EVERY EVENING AS DIRECTED  . glucosamine-chondroitin 500-400 MG tablet Take 1 tablet by mouth 2 (two) times daily.   . Multiple Vitamin  (MULTIVITAMIN) tablet Take 1 tablet by mouth daily.   . Omega-3 Fatty Acids (FISH OIL) 1200 MG CAPS Take 1 capsule by mouth 2 (two) times daily.   . [DISCONTINUED] atorvastatin (LIPITOR) 10 MG tablet Take 1 tablet (10 mg total) by mouth daily.  . [DISCONTINUED] Febuxostat (ULORIC) 80 MG TABS Take 1 tablet (80 mg total) by mouth daily.   No facility-administered encounter medications on file as of 04/11/2018.   :  Review of Systems:  Out of a complete 14 point review of systems, all are reviewed and negative with the exception of these symptoms as listed below: Review of Systems  Neurological:       Pt presents today to discuss his cpap. Pt does not like his cpap.    Objective:  Neurological Exam  Physical Exam Physical Examination:   Vitals:   04/11/18 1302  BP: 135/81  Pulse: 68   General Examination: The patient is a very pleasant 76 y.o. male in no acute distress. He appears well-developed and well-nourished and well groomed. Good spirits.   HEENT: Normocephalic, atraumatic, pupils are equal, round and reactive to light and accommodation. Extraocular tracking is good without limitation to gaze excursion or nystagmus noted. Normal smooth pursuit is noted. Hearing is grossly intact. Face is symmetric with normal facial animation and normal facial sensation. Speech is clear with no dysarthria noted. There is no hypophonia. There is no lip, neck/head, jaw or voice tremor. Neck with FROM. Oropharynx exam reveals: mild mouth dryness, good dental hygiene and moderate airway crowding. Mallampati is class II. Tongue protrudes centrally and palate elevates symmetrically. Tonsils are small.  Chest: Clear to auscultation without wheezing, rhonchi or crackles noted.  Heart: S1+S2+0, regular and normal without murmurs, rubs or gallops noted.   Abdomen: Soft, non-tender and non-distended with normal bowel sounds appreciated on auscultation.  Extremities: There is no pitting edema in the  distal lower extremities bilaterally.   Skin: Warm and dry without trophic changes noted.  Musculoskeletal: exam reveals no obvious joint deformities, tenderness or joint   swelling or erythema.   Neurologically:  Mental status: The patient is awake, alert and oriented in all 4 spheres. His immediate and remote memory, attention, language skills and fund of knowledge are appropriate. There is no evidence of aphasia, agnosia, apraxia or anomia. Speech is clear with normal prosody and enunciation. Thought process is linear. Mood is normal and affect is normal.  Cranial nerves II - XII are as described above under HEENT exam. In addition: shoulder shrug is normal with equal shoulder height noted. Motor exam: Normal bulk, strength and tone is noted. There is no drift, tremor or rebound. Romberg is negative. Fine motor skills and coordination: grossly intact.  Cerebellar testing: No dysmetria or intention tremor on finger to nose testing. Heel to shin is unremarkable bilaterally. There is no truncal or gait ataxia.  Sensory exam: intact to light touch.   Gait, station and balance: He stands easily. No veering to one side is noted. No leaning to one side is noted. Posture is age-appropriate and stance is narrow based. Gait shows normal stride length and normal pace. No problems turning are noted.   Assessment and Plan:  In summary, Joshua Dominguez is a very pleasant 76-year old male with an underlying medical history of allergic rhinitis, gout, hemorrhoids, history of prostatitis, history of prostate cancer, degenerative lumbar spine disease, erectile dysfunction, status post multiple surgeries including hydrocele correction, appendectomy, cataract extractions, arthroscopic knee surgery on the left, radioactive seed implants, and history of obesity, who Presents for follow-up consultation of his moderate to severe obstructive sleep apnea after sleep study test with a baseline sleep study in January 2019,  followed by a CPAP titration study in March 2019 after which he establish treatment with CPAP therapy at home. He is using a nasal mask at a pressure of 9 cm. He is compliant with treatment and highly commended for this. He has not noticed any telltale improvements in his sleep quality or sleep consolidation but does not snore as much and sleeps quieter per wife feedback to him. He has left foot pain including tendon pain and forefoot pain, this does not prevent him from staying active physically. He may have a touch first leg symptoms as well. He also reports having neuropathy. He had significant PLMS during the CPAP titration study without associated arousals and she is not bothered by leg movements in his sleep or notices any leg twitching, his wife is also not noticed any leg twitching while he is asleep we will monitor him for restless leg symptoms and PLMD. He is going to travel to [place] for about 2 weeks. He is wondering if he should get travel CPAP. I placed an order for this and he can certainly talk to his DME company about this, he is worried about the cost of it. He is encouraged to look into it. I will see him back routinely in 6 months, sooner if needed. I answered all his questions today and he was in agreement. I spent 25 minutes in total face-to-face time with the patient, more than 50% of which was spent in counseling and coordination of care, reviewing test results, reviewing medication and discussing or reviewing the diagnosis of OSA, its prognosis and treatment options. Pertinent laboratory and imaging test results that were available during this visit with the patient were reviewed by me and considered in my medical decision making (see chart for details).   

## 2018-04-18 ENCOUNTER — Ambulatory Visit: Payer: Self-pay | Admitting: Neurology

## 2018-06-24 ENCOUNTER — Other Ambulatory Visit: Payer: Self-pay | Admitting: Family Medicine

## 2018-06-24 DIAGNOSIS — G629 Polyneuropathy, unspecified: Secondary | ICD-10-CM

## 2018-09-25 ENCOUNTER — Other Ambulatory Visit: Payer: Self-pay | Admitting: Family Medicine

## 2018-09-25 DIAGNOSIS — G629 Polyneuropathy, unspecified: Secondary | ICD-10-CM

## 2018-10-17 ENCOUNTER — Ambulatory Visit: Payer: BC Managed Care – PPO | Admitting: Neurology

## 2018-12-23 ENCOUNTER — Other Ambulatory Visit: Payer: Self-pay | Admitting: *Deleted

## 2018-12-23 DIAGNOSIS — G629 Polyneuropathy, unspecified: Secondary | ICD-10-CM

## 2018-12-23 MED ORDER — AMITRIPTYLINE HCL 50 MG PO TABS: 50.0000 mg | ORAL_TABLET | Freq: Every day | ORAL | 3 refills | Status: DC

## 2018-12-28 ENCOUNTER — Other Ambulatory Visit: Payer: Self-pay | Admitting: Family Medicine

## 2018-12-28 DIAGNOSIS — G629 Polyneuropathy, unspecified: Secondary | ICD-10-CM

## 2018-12-30 MED ORDER — GABAPENTIN 300 MG PO CAPS: ORAL_CAPSULE | ORAL | 0 refills | Status: DC

## 2019-01-02 ENCOUNTER — Other Ambulatory Visit: Payer: Self-pay | Admitting: *Deleted

## 2019-01-02 DIAGNOSIS — G629 Polyneuropathy, unspecified: Secondary | ICD-10-CM

## 2019-01-02 MED ORDER — GABAPENTIN 300 MG PO CAPS: ORAL_CAPSULE | ORAL | 0 refills | Status: DC

## 2019-01-09 ENCOUNTER — Other Ambulatory Visit: Payer: Self-pay | Admitting: Family Medicine

## 2019-01-15 ENCOUNTER — Other Ambulatory Visit: Payer: Self-pay | Admitting: Family Medicine

## 2019-01-15 MED ORDER — FEBUXOSTAT 80 MG PO TABS: 1.0000 | ORAL_TABLET | Freq: Every day | ORAL | 1 refills | Status: DC

## 2019-02-19 DIAGNOSIS — R69 Illness, unspecified: Secondary | ICD-10-CM | POA: Diagnosis not present

## 2019-02-20 ENCOUNTER — Encounter: Payer: Self-pay | Admitting: Family Medicine

## 2019-02-20 ENCOUNTER — Telehealth: Payer: Self-pay | Admitting: *Deleted

## 2019-02-20 ENCOUNTER — Ambulatory Visit (INDEPENDENT_AMBULATORY_CARE_PROVIDER_SITE_OTHER): Payer: Medicare HMO | Admitting: Family Medicine

## 2019-02-20 ENCOUNTER — Other Ambulatory Visit: Payer: Self-pay

## 2019-02-20 VITALS — BP 126/84 | HR 62 | Temp 97.9°F | Resp 14 | Ht 77.0 in | Wt 269.0 lb

## 2019-02-20 DIAGNOSIS — Z0001 Encounter for general adult medical examination with abnormal findings: Secondary | ICD-10-CM

## 2019-02-20 DIAGNOSIS — Z8739 Personal history of other diseases of the musculoskeletal system and connective tissue: Secondary | ICD-10-CM

## 2019-02-20 DIAGNOSIS — E785 Hyperlipidemia, unspecified: Secondary | ICD-10-CM | POA: Diagnosis not present

## 2019-02-20 DIAGNOSIS — Z8546 Personal history of malignant neoplasm of prostate: Secondary | ICD-10-CM

## 2019-02-20 DIAGNOSIS — Z0000 Encounter for general adult medical examination without abnormal findings: Secondary | ICD-10-CM

## 2019-02-20 NOTE — Telephone Encounter (Signed)
-----   Message from Susy Frizzle, MD sent at 02/20/2019  9:43 AM EDT ----- Needs cologuard

## 2019-02-20 NOTE — Progress Notes (Signed)
Subjective:    Patient ID: Joshua Dominguez, male    DOB: [birth year], 78 y.o.   MRN: [id number]  HPI  02/2018 Patient is here today for complete physical exam. His last colonoscopy was in 2010 and is due again next year at age 63. He is undergone radioactive seed implantation in 2014 for prostate cancer. His immunizations are up-to-date including Prevnar, Pneumovax, Zostavax, and Tdap.  He is due for Shingrix.  Calculated with the patient his 10-year risk of cardiovascular disease and found to be 23% primarily due to his low HDL along with age of 29 years.  We discussed the new data regarding initiation of statins with a 10-year risk greater than 7.5%.  I estimate that his risk reduction can be up to 10% after we start a statin depending on the drop in his cholesterol.  At that time, my plan was: Physical exam is completely normal.  The patient's PSA has not changed essentially in 3 years and shows low risk for prostate cancer recurrence.  His 10-year risk of cardiovascular disease is over 23%.  I have recommended starting Lipitor 10 mg a day and rechecking lab work in 6 months to monitor his cholesterol along with his liver function test.  We did discuss the new black box warning for uloric and increasing risk of myocardial infarction.  Patient elects to stay on this medication as he is failed allopurinol in the past.  Patient will be due for colonoscopy next year.  I have recommended Shingrix and the patient will consider the price prior to receiving.  Patient is an extremely fit 76 years.  He exercises and lifts weights every day and runs 3 days a week.  I encouraged him to continue this healthy lifestyle.  He is using NSAIDs almost on a regular basis due to chronic foot pain.  We discussed the risk of stomach ulcers and cardiovascular disease.  I have recommended taking Zantac on a daily basis if he continues to use Aleve on a daily basis to reduce his risk of ulcers.  02/20/19 Here for his annual  wellness visit.  His immunization records are listed below and are all up to date: Immunization History  Administered Date(s) Administered  . Influenza Split 08/22/2011, 06/13/2013  . Influenza Whole 08/16/2010  . Influenza, High Dose Seasonal PF 07/15/2018  . Influenza-Unspecified 07/20/2016, 06/27/2017  . Pneumococcal Conjugate-13 12/11/2013  . Pneumococcal Polysaccharide-23 09/13/2004, 06/14/2010, 11/27/2011  . Td 04/18/2005  . Tdap 02/01/2016  . Zoster 06/05/2011  . Zoster Recombinat (Shingrix) 03/28/2018   I reviewed his functional assessment.  Patient denies any difficulty performing activities of daily living.  His fall risk discussed with and his depression screening were performed and showed no abnormalities.  Patient's Mini-Mental status exam is completely normal and shows no evidence of early onset dementia.  Biggest issue continues to be the pain in his feet primarily left foot.  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  He is received 1 cortisone shot in the heel and saw benefit for a day or so.  He is questioning starting prednisone to calm the pain down in his heel.  I recommended against systemic steroids due to the risk of osteoporosis, etc.  However I would recommend a series of cortisone injections in the heel performed by podiatrist prior to receiving any, surgery.  Patient is also due for a colonoscopy this year.  He is also due for PSA to monitor for recurrence of his  prostate cancer.  Otherwise he is doing well with no difficulties.  Past Medical History:  Diagnosis Date  . Allergic rhinitis   . Bilateral hydrocele   . DDD (degenerative disc disease), lumbar     L3-L4  . ED (erectile dysfunction) of organic origin   . Gout   . Hemorrhoids   . History of prostatitis   . Lumbar herniated disc 2007   L4 and L5 did not require surgery  . Prostate cancer ([place])    S/P  RADIOACTIVE SEED IMPLANTS 05-09-2013   Past Surgical  History:  Procedure Laterality Date  . APPENDECTOMY  FEB 2000  . CATARACT EXTRACTION W/ INTRAOCULAR LENS  IMPLANT, BILATERAL    . HYDROCELE EXCISION Bilateral 01/02/2014   Procedure: BILATERAL HYDROCELECTOMY;  Surgeon: Claybon Jabs, MD;  Location: [facility];  Service: Urology;  Laterality: Bilateral;  . HYDROCELE EXCISION Left 04/17/2014   Procedure: LEFT HYDROCELECTOMY ADULT;  Surgeon: Claybon Jabs, MD;  Location: [facility];  Service: Urology;  Laterality: Left;  . KNEE ARTHROSCOPY Left   . RADIOACTIVE SEED IMPLANT N/A 05/09/2013   Procedure: RADIOACTIVE SEED IMPLANT;  Surgeon: Claybon Jabs, MD;  Location: [facility];  Service: Urology;  Laterality: N/A;   Current Outpatient Medications on File Prior to Visit  Medication Sig Dispense Refill  . amitriptyline (ELAVIL) 50 MG tablet Take 1 tablet (50 mg total) by mouth at bedtime. 90 tablet 3  . colchicine 0.6 MG tablet Take two by mouth now then one by mouth in one hour if no better. (Patient taking differently: Take two by mouth now then one by mouth in one hour if no better. PRN) 10 tablet 0  . Diclofenac Sodium 2 % SOLN Place onto the skin daily.    . Febuxostat 80 MG TABS Take 1 tablet (80 mg total) by mouth daily. 90 tablet 1  . gabapentin (NEURONTIN) 300 MG capsule TAKE 3 CAPSULES BY MOUTH EVERY MORNING AND 2 CAPSULES EVERY EVENING AS DIRECTED 450 capsule 0  . glucosamine-chondroitin 500-400 MG tablet Take 1 tablet by mouth 2 (two) times daily.     . Multiple Vitamin (MULTIVITAMIN) tablet Take 1 tablet by mouth daily.     . Omega-3 Fatty Acids (FISH OIL) 1200 MG CAPS Take 1 capsule by mouth 2 (two) times daily.      No current facility-administered medications on file prior to visit.    Allergies  Allergen Reactions  . Celebrex [Celecoxib] Rash  . Oxymetazoline Hcl Rash   Social History   Socioeconomic History  . Marital status: Married    Spouse name: Not on file  . Number of  children: Not on file  . Years of education: Not on file  . Highest education level: Not on file  Occupational History  . Occupation: Consulting/professor  Social Needs  . Financial resource strain: Not on file  . Food insecurity:    Worry: Not on file    Inability: Not on file  . Transportation needs:    Medical: Not on file    Non-medical: Not on file  Tobacco Use  . Smoking status: Former Smoker    Types: Pipe    Last attempt to quit: 11/13/1976    Years since quitting: 42.2  . Smokeless tobacco: Never Used  Substance and Sexual Activity  . Alcohol use: Yes    Alcohol/week: 14.0 standard drinks    Types: 14 Glasses of wine per week  . Drug use: No  .  Sexual activity: Not on file  Lifestyle  . Physical activity:    Days per week: Not on file    Minutes per session: Not on file  . Stress: Not on file  Relationships  . Social connections:    Talks on phone: Not on file    Gets together: Not on file    Attends religious service: Not on file    Active member of club or organization: Not on file    Attends meetings of clubs or organizations: Not on file    Relationship status: Not on file  . Intimate partner violence:    Fear of current or ex partner: Not on file    Emotionally abused: Not on file    Physically abused: Not on file    Forced sexual activity: Not on file  Other Topics Concern  . 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 Hypertension Father   . Gout Father   . Prostate cancer Father   . Lung cancer Mother        +smoker  . Lung cancer Maternal Grandfather        -smoker  . Heart disease Sister   . Heart disease Brother       Review of Systems  All other  systems reviewed and are negative.      Objective:   Physical Exam  Constitutional: He is oriented to person, place, and time. He appears well-developed and well-nourished. No distress.  HENT:  Head: Normocephalic and atraumatic.  Right Ear: External ear normal.  Left Ear: External ear normal.  Nose: Nose normal.  Mouth/Throat: Oropharynx is clear and moist. No oropharyngeal exudate.  Eyes: Pupils are equal, round, and reactive to light. Conjunctivae and EOM are normal. Right eye exhibits no discharge. Left eye exhibits no discharge. No scleral icterus.  Neck: Normal range of motion. Neck supple. No JVD present. No thyromegaly present.  Cardiovascular: Normal rate, regular rhythm and normal heart sounds. Exam reveals no gallop and no friction rub.  No murmur heard. Pulmonary/Chest: Effort normal and breath sounds normal. No stridor. No respiratory distress. He has no wheezes. He has no rales. He exhibits no tenderness.  Abdominal: Soft. Bowel sounds are normal. He exhibits no distension and no mass. There is no abdominal tenderness. There is no rebound and no guarding.  Musculoskeletal: Normal range of motion.        General: No tenderness or edema.  Lymphadenopathy:    He has no cervical adenopathy.  Neurological: He is alert and oriented to person, place, and time. He has normal reflexes. No cranial nerve deficit. He exhibits normal muscle tone. Coordination normal.  Skin: Skin is warm. No rash noted. He is not diaphoretic. No erythema. No pallor.  Psychiatric: He has a normal mood and affect. His behavior is normal. Judgment and thought content normal.  Vitals reviewed.         Assessment & Plan:  General medical exam - Plan: CBC with Differential/Platelet, COMPLETE METABOLIC PANEL WITH GFR, Lipid panel, PSA, Uric acid  Dyslipidemia - Plan: COMPLETE METABOLIC PANEL WITH GFR, Lipid panel  Personal history of prostate cancer - Plan: PSA  History of gout -  Plan: Uric acid   Patient's physical exam is completely normal.  His immunizations are up-to-date.  I will schedule the patient for Cologuard for colon cancer screening particular given the current coronavirus pandemic.  I will also check a PSA to monitor for recurrence of his prostate cancer.  Check a CBC, CMP, and fasting lipid panel to monitor his cholesterol.  Blood pressure today is outstanding.  Depression screen, dementia screen, functional assessment, and fall assessment are all completely normal.  Check a uric acid level to ensure uric acid level is less than 6.  Recommended trying a series of cortisone injections in the heel for plantar fasciitis prior to trying systemic steroids.  The remainder of his preventative care is up-to-date.

## 2019-02-20 NOTE — Telephone Encounter (Signed)
Received verbal orders for Cologuard.   Order placed via [facility].   Cologuard (Order [phone])

## 2019-02-21 LAB — LIPID PANEL
Cholesterol: 201 mg/dL — ABNORMAL HIGH (ref <200)
HDL: 34 mg/dL — ABNORMAL LOW (ref >=40)
LDL Cholesterol (Calc): 124 mg/dL (calc) — ABNORMAL HIGH
Non-HDL Cholesterol (Calc): 167 mg/dL (calc) — ABNORMAL HIGH (ref <130)
Total CHOL/HDL Ratio: 5.9 (calc) — ABNORMAL HIGH (ref <5.0)
Triglycerides: 278 mg/dL — ABNORMAL HIGH (ref <150)

## 2019-02-21 LAB — CBC WITH DIFFERENTIAL/PLATELET
Absolute Monocytes: 578 cells/uL (ref 200–950)
Basophils Absolute: 38 cells/uL (ref 0–200)
Basophils Relative: 0.7 %
Eosinophils Absolute: 108 cells/uL (ref 15–500)
Eosinophils Relative: 2 %
HCT: 46.7 % (ref 38.5–50.0)
Hemoglobin: 16 g/dL (ref 13.2–17.1)
Lymphs Abs: 1949 cells/uL (ref 850–3900)
MCH: 31.7 pg (ref 27.0–33.0)
MCHC: 34.3 g/dL (ref 32.0–36.0)
MCV: 92.7 fL (ref 80.0–100.0)
MPV: 10.6 fL (ref 7.5–12.5)
Monocytes Relative: 10.7 %
Neutro Abs: 2727 cells/uL (ref 1500–7800)
Neutrophils Relative %: 50.5 %
Platelets: 173 10*3/uL (ref 140–400)
RBC: 5.04 10*6/uL (ref 4.20–5.80)
RDW: 13 % (ref 11.0–15.0)
Total Lymphocyte: 36.1 %
WBC: 5.4 10*3/uL (ref 3.8–10.8)

## 2019-02-21 LAB — COMPLETE METABOLIC PANEL WITH GFR
AG Ratio: 2 (calc) (ref 1.0–2.5)
ALT: 21 U/L (ref 9–46)
AST: 21 U/L (ref 10–35)
Albumin: 4.3 g/dL (ref 3.6–5.1)
Alkaline phosphatase (APISO): 53 U/L (ref 35–144)
BUN/Creatinine Ratio: 25 (calc) — ABNORMAL HIGH (ref 6–22)
BUN: 27 mg/dL — ABNORMAL HIGH (ref 7–25)
CO2: 28 mmol/L (ref 20–32)
Calcium: 9.6 mg/dL (ref 8.6–10.3)
Chloride: 106 mmol/L (ref 98–110)
Creat: 1.1 mg/dL (ref 0.70–1.18)
GFR, Est African American: 76 mL/min/{1.73_m2} (ref >=60)
GFR, Est Non African American: 65 mL/min/{1.73_m2} (ref >=60)
Globulin: 2.2 g/dL (calc) (ref 1.9–3.7)
Glucose, Bld: 79 mg/dL (ref 65–99)
Potassium: 4.6 mmol/L (ref 3.5–5.3)
Sodium: 141 mmol/L (ref 135–146)
Total Bilirubin: 0.8 mg/dL (ref 0.2–1.2)
Total Protein: 6.5 g/dL (ref 6.1–8.1)

## 2019-02-21 LAB — PSA: PSA: 0.2 ng/mL (ref <=4.0)

## 2019-02-21 LAB — URIC ACID: Uric Acid, Serum: 3.7 mg/dL — ABNORMAL LOW (ref 4.0–8.0)

## 2019-02-26 ENCOUNTER — Encounter: Payer: Self-pay | Admitting: Podiatry

## 2019-02-26 ENCOUNTER — Other Ambulatory Visit: Payer: Self-pay

## 2019-02-26 ENCOUNTER — Ambulatory Visit: Payer: Medicare HMO | Admitting: Podiatry

## 2019-02-26 ENCOUNTER — Ambulatory Visit (INDEPENDENT_AMBULATORY_CARE_PROVIDER_SITE_OTHER): Payer: Medicare HMO

## 2019-02-26 VITALS — Temp 97.1°F

## 2019-02-26 DIAGNOSIS — G5792 Unspecified mononeuropathy of left lower limb: Secondary | ICD-10-CM | POA: Diagnosis not present

## 2019-02-26 DIAGNOSIS — M2042 Other hammer toe(s) (acquired), left foot: Secondary | ICD-10-CM | POA: Diagnosis not present

## 2019-02-26 NOTE — Progress Notes (Signed)
Subjective:  Patient ID: Joshua Dominguez, male    DOB: Jan 19, 1941,  MRN: [id number] HPI Chief Complaint  Patient presents with  . Foot Pain    Dorsal and plantar midfoot left - burning, aching intermittently over the last 10 years, active runner, wears orthotics, tried seeing orthopedist, accupuncture    78 y.o. male presents with the above complaint.   ROS: Denies fever chills nausea vomiting muscle aches pains calf pain back pain chest pain shortness of breath.  Past Medical History:  Diagnosis Date  . Allergic rhinitis   . Bilateral hydrocele   . DDD (degenerative disc disease), lumbar     L3-L4  . ED (erectile dysfunction) of organic origin   . Gout   . Hemorrhoids   . History of prostatitis   . Lumbar herniated disc 2007   L4 and L5 did not require surgery  . Prostate cancer ([place])    S/P  RADIOACTIVE SEED IMPLANTS 05-09-2013   Past Surgical History:  Procedure Laterality Date  . APPENDECTOMY  FEB 2000  . CATARACT EXTRACTION W/ INTRAOCULAR LENS  IMPLANT, BILATERAL    . HYDROCELE EXCISION Bilateral 01/02/2014   Procedure: BILATERAL HYDROCELECTOMY;  Surgeon: Claybon Jabs, MD;  Location: [facility];  Service: Urology;  Laterality: Bilateral;  . HYDROCELE EXCISION Left 04/17/2014   Procedure: LEFT HYDROCELECTOMY ADULT;  Surgeon: Claybon Jabs, MD;  Location: [facility];  Service: Urology;  Laterality: Left;  . KNEE ARTHROSCOPY Left   . RADIOACTIVE SEED IMPLANT N/A 05/09/2013   Procedure: RADIOACTIVE SEED IMPLANT;  Surgeon: Claybon Jabs, MD;  Location: [facility]  Service: Urology;  Laterality: N/A;    Current Outpatient Medications:  .  amitriptyline (ELAVIL) 50 MG tablet, Take 1 tablet (50 mg total) by mouth at bedtime., Disp: 90 tablet, Rfl: 3 .  colchicine 0.6 MG tablet, Take two by mouth now then one by mouth in one hour if no better. (Patient not taking: Reported on 02/20/2019), Disp: 10 tablet, Rfl: 0 .  Febuxostat 80  MG TABS, Take 1 tablet (80 mg total) by mouth daily., Disp: 90 tablet, Rfl: 1 .  gabapentin (NEURONTIN) 300 MG capsule, TAKE 3 CAPSULES BY MOUTH EVERY MORNING AND 2 CAPSULES EVERY EVENING AS DIRECTED, Disp: 450 capsule, Rfl: 0 .  glucosamine-chondroitin 500-400 MG tablet, Take 1 tablet by mouth 2 (two) times daily. , Disp: , Rfl:  .  Multiple Vitamin (MULTIVITAMIN) tablet, Take 1 tablet by mouth daily. , Disp: , Rfl:  .  Omega-3 Fatty Acids (FISH OIL) 1200 MG CAPS, Take 1 capsule by mouth 2 (two) times daily. , Disp: , Rfl:   Allergies  Allergen Reactions  . Celebrex [Celecoxib] Rash  . Oxymetazoline Hcl Rash   Review of Systems Objective:   Vitals:   02/26/19 1035  Temp: (!) 97.1 F (36.2 C)    General: Well developed, nourished, in no acute distress, alert and oriented x3   Dermatological: Skin is warm, dry and supple bilateral. Nails x 10 are well maintained; remaining integument appears unremarkable at this time. There are no open sores, no preulcerative lesions, no rash or signs of infection present.  Vascular: Dorsalis Pedis artery and Posterior Tibial artery pedal pulses are 2/4 bilateral with immedate capillary fill time. Pedal hair growth present. No varicosities and no lower extremity edema present bilateral.   Neruologic: Grossly intact via light touch bilateral. Vibratory intact via tuning fork bilateral. Protective threshold with Semmes Wienstein monofilament intact to all  pedal sites bilateral. Patellar and Achilles deep tendon reflexes 2+ bilateral. No Babinski or clonus noted bilateral.   Musculoskeletal: No gross boney pedal deformities bilateral. No pain, crepitus, or limitation noted with foot and ankle range of motion bilateral. Muscular strength 5/5 in all groups tested bilateral.  No reproducible pain though he does feel some tenderness on deep palpation of the plantar fascia central band.  Gait: Unassisted, Nonantalgic.    Radiographs:  Radiographs taken  today demonstrate thickening of the plantar fascia at its insertion site otherwise moderately cavus foot is noted.  The thickening appears to be old that it is brighter more sclerotic.  Spurring is also noted.  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  Plan: At this point I went ahead and injected 20 mg Kenalog 5 mg Marcaine to the point of maximal tenderness at Baxter's neuroma in the plantar fashion calcaneal insertion site.  We did discuss the possible need for dehydrated alcohol.  He has lots of 100 mg of gabapentin remaining so we increased his gabapentin 600 in the morning and 1200 at bedtime.  Hopefully this will help reduce his neuropathic symptoms that he has between 11:00 and 2:00 in the morning.     Joshua Dominguez T. [place], [place]

## 2019-03-06 ENCOUNTER — Encounter: Payer: Self-pay | Admitting: Family Medicine

## 2019-03-06 DIAGNOSIS — Z1211 Encounter for screening for malignant neoplasm of colon: Secondary | ICD-10-CM | POA: Diagnosis not present

## 2019-03-06 DIAGNOSIS — Z1212 Encounter for screening for malignant neoplasm of rectum: Secondary | ICD-10-CM | POA: Diagnosis not present

## 2019-03-11 LAB — COLOGUARD: Cologuard: NEGATIVE

## 2019-03-19 NOTE — Telephone Encounter (Signed)
Received the results of Cologuard screening.   Screening is negative.   A negative result indicates a low likelihood of colorectal cancer is present. Following a negative Cologuard result, the American Cancer Society recommends a Cologuard re-screening interval of 3 years.   Letter sent.

## 2019-03-24 ENCOUNTER — Other Ambulatory Visit: Payer: Self-pay

## 2019-03-24 ENCOUNTER — Ambulatory Visit: Payer: Medicare HMO | Admitting: Podiatry

## 2019-03-24 ENCOUNTER — Encounter: Payer: Self-pay | Admitting: Podiatry

## 2019-03-24 VITALS — Temp 97.6°F

## 2019-03-24 DIAGNOSIS — G5792 Unspecified mononeuropathy of left lower limb: Secondary | ICD-10-CM

## 2019-03-24 NOTE — Progress Notes (Signed)
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  He states that he has not started his 600 mg daily but he has increased his nighttime dose to 1200 mg but he is still getting considerable pain starting from around the 10:00 to 2:00 at night.  States that the injection that we gave him before only lasted 1 day.  Has had a nerve conduction velocity in the past.  It was negative.  Does relate back trauma many years ago.  Objective: Vital signs are stable alert and oriented x3 pulses palpable left.  Pain is reproducible on palpation left heel.  Assessment: Neuropathy with Baxters neuritis.  Cannot rule out sciatica or back impingement.  Plan: At this point we did discuss melatonin.  We also discussed increasing 600 mg in the morning lunchtime and bedtime.  I injected his left heel with his first dose of dehydrated alcohol for the neuritis.  A total of 2 cc 4% dehydrated alcohol was injected.  He tolerated procedure well without complications.

## 2019-04-07 ENCOUNTER — Encounter: Payer: Self-pay | Admitting: Gastroenterology

## 2019-04-16 ENCOUNTER — Other Ambulatory Visit: Payer: Self-pay

## 2019-04-16 ENCOUNTER — Encounter: Payer: Self-pay | Admitting: Podiatry

## 2019-04-16 ENCOUNTER — Ambulatory Visit: Payer: Medicare HMO | Admitting: Podiatry

## 2019-04-16 VITALS — Temp 98.2°F

## 2019-04-16 DIAGNOSIS — G5792 Unspecified mononeuropathy of left lower limb: Secondary | ICD-10-CM | POA: Diagnosis not present

## 2019-04-16 NOTE — Progress Notes (Signed)
He presents today for follow-up of his Baxters neuritis states that there is some improvement.  States that the pains are less significant and of less duration.  Objective: Vital signs are stable alert and oriented x3 no change in physical exam still has pain to palpation medial Cokato tubercle left foot.  Assessment: Baxters neuritis left foot.  Plan: After sterile Betadine skin prep I injected 2 cc of a 4% dehydrated alcohol and local anesthetic mixture to the plantar left heel at the point of maximal tenderness.  He tolerated procedure well without complications.  This was his second injection we will follow-up with him in 3 weeks for his third injection.

## 2019-04-17 DIAGNOSIS — G4733 Obstructive sleep apnea (adult) (pediatric): Secondary | ICD-10-CM | POA: Diagnosis not present

## 2019-04-23 DIAGNOSIS — E669 Obesity, unspecified: Secondary | ICD-10-CM | POA: Diagnosis not present

## 2019-04-23 DIAGNOSIS — Z809 Family history of malignant neoplasm, unspecified: Secondary | ICD-10-CM | POA: Diagnosis not present

## 2019-04-23 DIAGNOSIS — M109 Gout, unspecified: Secondary | ICD-10-CM | POA: Diagnosis not present

## 2019-04-23 DIAGNOSIS — G629 Polyneuropathy, unspecified: Secondary | ICD-10-CM | POA: Diagnosis not present

## 2019-04-23 DIAGNOSIS — J309 Allergic rhinitis, unspecified: Secondary | ICD-10-CM | POA: Diagnosis not present

## 2019-04-23 DIAGNOSIS — N529 Male erectile dysfunction, unspecified: Secondary | ICD-10-CM | POA: Diagnosis not present

## 2019-04-23 DIAGNOSIS — Z8249 Family history of ischemic heart disease and other diseases of the circulatory system: Secondary | ICD-10-CM | POA: Diagnosis not present

## 2019-04-23 DIAGNOSIS — Z8546 Personal history of malignant neoplasm of prostate: Secondary | ICD-10-CM | POA: Diagnosis not present

## 2019-04-23 DIAGNOSIS — K5900 Constipation, unspecified: Secondary | ICD-10-CM | POA: Diagnosis not present

## 2019-04-23 DIAGNOSIS — Z791 Long term (current) use of non-steroidal anti-inflammatories (NSAID): Secondary | ICD-10-CM | POA: Diagnosis not present

## 2019-05-04 ENCOUNTER — Other Ambulatory Visit: Payer: Self-pay | Admitting: Family Medicine

## 2019-05-07 ENCOUNTER — Other Ambulatory Visit: Payer: Self-pay

## 2019-05-07 ENCOUNTER — Ambulatory Visit: Payer: Medicare HMO | Admitting: Podiatry

## 2019-05-07 ENCOUNTER — Encounter: Payer: Self-pay | Admitting: Podiatry

## 2019-05-07 DIAGNOSIS — G5792 Unspecified mononeuropathy of left lower limb: Secondary | ICD-10-CM

## 2019-05-07 NOTE — Progress Notes (Signed)
He presents today states that his Baxters neuritis is improving with every injection.  That he is starting to have more pain across the dorsum of the foot.  Objective: Vital signs are stable he is alert and oriented x3.  Has some tenderness on palpation of the tibialis anterior area that is been giving him a lot of problems.  He has much decrease in pain on palpation of the Baxters nerve area or plantar fascia.  Assessment: Baxters neuritis left foot.  Neuropathic changes across the dorsum of the left foot.  Plan: Discussed etiology pathology conservative surgical therapies at this point great argument continue our sclerosing therapy he tolerated this well after the injection today and sterile Betadine skin prep 2 cc 4% dehydrated alcohol.  Follow-up with him in 3 to 4 weeks may need to consider isolating the medial dorsal cutaneous nerve and starting alcohol with it as well.

## 2019-05-14 ENCOUNTER — Other Ambulatory Visit: Payer: Self-pay | Admitting: Family Medicine

## 2019-05-14 MED ORDER — FEBUXOSTAT 80 MG PO TABS: 1.0000 | ORAL_TABLET | Freq: Every day | ORAL | 1 refills | Status: DC

## 2019-05-28 DIAGNOSIS — X32XXXA Exposure to sunlight, initial encounter: Secondary | ICD-10-CM | POA: Diagnosis not present

## 2019-05-28 DIAGNOSIS — L570 Actinic keratosis: Secondary | ICD-10-CM | POA: Diagnosis not present

## 2019-05-28 DIAGNOSIS — D2339 Other benign neoplasm of skin of other parts of face: Secondary | ICD-10-CM | POA: Diagnosis not present

## 2019-05-28 DIAGNOSIS — L718 Other rosacea: Secondary | ICD-10-CM | POA: Diagnosis not present

## 2019-05-28 DIAGNOSIS — L821 Other seborrheic keratosis: Secondary | ICD-10-CM | POA: Diagnosis not present

## 2019-06-04 ENCOUNTER — Ambulatory Visit: Payer: Medicare HMO | Admitting: Podiatry

## 2019-06-04 ENCOUNTER — Other Ambulatory Visit: Payer: Self-pay

## 2019-06-04 ENCOUNTER — Encounter: Payer: Self-pay | Admitting: Podiatry

## 2019-06-04 VITALS — Temp 98.2°F

## 2019-06-04 DIAGNOSIS — G5792 Unspecified mononeuropathy of left lower limb: Secondary | ICD-10-CM

## 2019-06-04 NOTE — Progress Notes (Signed)
He presents today for follow-up of his neuritis to the dorsal aspect of the intermediate dorsal cutaneous nerve he states that seems to be doing much better and his Baxters neuritis is much improved.  Objective: Vital signs are stable he is alert and oriented x3 no change in physical exam.  Still has pain on palpation to the medial dorsal cutaneous nerve right at the level of the tibialis anterior and the ankle.  Also pain on palpation of the plantar fascia at Baxter's nerve.  Assessment: Slowly resolving neuritis Baxters nerve and medial dorsal cutaneous nerve.  Plan: Injected both of these areas today after sterile Betadine skin prep with 2 cc of 4% dehydrated alcohol.  He tolerated procedures well and I will follow-up with him in 3 or 4 weeks.  We will continue this therapy until it no longer needs to be performed or is no longer improving.

## 2019-06-25 ENCOUNTER — Other Ambulatory Visit: Payer: Self-pay

## 2019-06-25 ENCOUNTER — Encounter: Payer: Self-pay | Admitting: Podiatry

## 2019-06-25 ENCOUNTER — Ambulatory Visit: Payer: Medicare HMO | Admitting: Podiatry

## 2019-06-25 VITALS — Temp 98.2°F

## 2019-06-25 DIAGNOSIS — G5792 Unspecified mononeuropathy of left lower limb: Secondary | ICD-10-CM

## 2019-06-25 NOTE — Progress Notes (Signed)
He presents today for follow-up of his Baxters neuritis states that is doing much better though I did have a tough night last night.  Objective: Vital signs are stable he is alert oriented x3 resolving Baxters neuritis left foot.  Medial dorsal cutaneous neuritis is present on palpation.  Assessment: Baxters neuritis left resolving dorsal medial cutaneous nerve resolving his neuritis.  Plan: To dehydrated alcohol injections today after local anesthetic was administered he tolerated procedure well without complications we will follow-up with me in 3 weeks

## 2019-07-08 ENCOUNTER — Ambulatory Visit (INDEPENDENT_AMBULATORY_CARE_PROVIDER_SITE_OTHER): Payer: Medicare HMO

## 2019-07-08 ENCOUNTER — Other Ambulatory Visit: Payer: Self-pay

## 2019-07-08 DIAGNOSIS — Z23 Encounter for immunization: Secondary | ICD-10-CM | POA: Diagnosis not present

## 2019-07-08 NOTE — Progress Notes (Signed)
Patient came in today to receive his annual flu vaccine. The fluad vaccine was given in his right deltoid. Patient tolerated well. VIS given

## 2019-07-16 ENCOUNTER — Ambulatory Visit: Payer: Medicare HMO | Admitting: Podiatry

## 2019-07-16 ENCOUNTER — Encounter: Payer: Self-pay | Admitting: Podiatry

## 2019-07-16 ENCOUNTER — Other Ambulatory Visit: Payer: Self-pay

## 2019-07-16 DIAGNOSIS — G5792 Unspecified mononeuropathy of left lower limb: Secondary | ICD-10-CM

## 2019-07-16 NOTE — Progress Notes (Signed)
He presents today states that the Baxters nerve neuritis is resolving very nicely however he still has considerable pain to the dorsal aspect of the left foot.  Objective: Vital signs are stable he is alert and oriented x3 he has pain on palpation Baxters nerve plantar aspect of the left heel.  Also the medial dorsal cutaneous nerve.  Assessment: Painful neuritis Baxters neuritis and medial dorsal cutaneous nerve.  Plan: Injected these areas today with 2 cc 4% dehydrated alcohol and local anesthetic.  Tolerated procedure well follow-up with him in the near future for further injections.

## 2019-08-13 ENCOUNTER — Ambulatory Visit: Payer: Medicare HMO | Admitting: Podiatry

## 2019-11-02 ENCOUNTER — Other Ambulatory Visit: Payer: Self-pay | Admitting: Family Medicine

## 2020-02-07 ENCOUNTER — Other Ambulatory Visit: Payer: Self-pay | Admitting: Family Medicine

## 2020-02-07 DIAGNOSIS — G629 Polyneuropathy, unspecified: Secondary | ICD-10-CM

## 2020-03-15 ENCOUNTER — Other Ambulatory Visit: Payer: PPO

## 2020-03-15 ENCOUNTER — Other Ambulatory Visit: Payer: Self-pay

## 2020-03-15 DIAGNOSIS — Z8546 Personal history of malignant neoplasm of prostate: Secondary | ICD-10-CM

## 2020-03-15 DIAGNOSIS — Z79899 Other long term (current) drug therapy: Secondary | ICD-10-CM

## 2020-03-15 DIAGNOSIS — C61 Malignant neoplasm of prostate: Secondary | ICD-10-CM

## 2020-03-15 DIAGNOSIS — Z0000 Encounter for general adult medical examination without abnormal findings: Secondary | ICD-10-CM | POA: Diagnosis not present

## 2020-03-15 DIAGNOSIS — E785 Hyperlipidemia, unspecified: Secondary | ICD-10-CM

## 2020-03-16 LAB — CBC WITH DIFFERENTIAL/PLATELET
Absolute Monocytes: 539 cells/uL (ref 200–950)
Basophils Absolute: 50 cells/uL (ref 0–200)
Basophils Relative: 0.9 %
Eosinophils Absolute: 149 cells/uL (ref 15–500)
Eosinophils Relative: 2.7 %
HCT: 45.7 % (ref 38.5–50.0)
Hemoglobin: 15.4 g/dL (ref 13.2–17.1)
Lymphs Abs: 1672 cells/uL (ref 850–3900)
MCH: 32.1 pg (ref 27.0–33.0)
MCHC: 33.7 g/dL (ref 32.0–36.0)
MCV: 95.2 fL (ref 80.0–100.0)
MPV: 10.6 fL (ref 7.5–12.5)
Monocytes Relative: 9.8 %
Neutro Abs: 3091 cells/uL (ref 1500–7800)
Neutrophils Relative %: 56.2 %
Platelets: 166 10*3/uL (ref 140–400)
RBC: 4.8 10*6/uL (ref 4.20–5.80)
RDW: 12.6 % (ref 11.0–15.0)
Total Lymphocyte: 30.4 %
WBC: 5.5 10*3/uL (ref 3.8–10.8)

## 2020-03-16 LAB — COMPREHENSIVE METABOLIC PANEL
AG Ratio: 1.6 (calc) (ref 1.0–2.5)
ALT: 21 U/L (ref 9–46)
AST: 28 U/L (ref 10–35)
Albumin: 4.1 g/dL (ref 3.6–5.1)
Alkaline phosphatase (APISO): 56 U/L (ref 35–144)
BUN/Creatinine Ratio: 18 (calc) (ref 6–22)
BUN: 23 mg/dL (ref 7–25)
CO2: 28 mmol/L (ref 20–32)
Calcium: 9.3 mg/dL (ref 8.6–10.3)
Chloride: 107 mmol/L (ref 98–110)
Creat: 1.27 mg/dL — ABNORMAL HIGH (ref 0.70–1.18)
Globulin: 2.5 g/dL (calc) (ref 1.9–3.7)
Glucose, Bld: 84 mg/dL (ref 65–99)
Potassium: 4.5 mmol/L (ref 3.5–5.3)
Sodium: 141 mmol/L (ref 135–146)
Total Bilirubin: 0.7 mg/dL (ref 0.2–1.2)
Total Protein: 6.6 g/dL (ref 6.1–8.1)

## 2020-03-16 LAB — LIPID PANEL
Cholesterol: 192 mg/dL (ref <200)
HDL: 34 mg/dL — ABNORMAL LOW (ref >=40)
LDL Cholesterol (Calc): 128 mg/dL (calc) — ABNORMAL HIGH
Non-HDL Cholesterol (Calc): 158 mg/dL (calc) — ABNORMAL HIGH (ref <130)
Total CHOL/HDL Ratio: 5.6 (calc) — ABNORMAL HIGH (ref <5.0)
Triglycerides: 188 mg/dL — ABNORMAL HIGH (ref <150)

## 2020-03-16 LAB — PSA: PSA: <0.1 ng/mL (ref <=4.0)

## 2020-03-18 ENCOUNTER — Other Ambulatory Visit: Payer: Self-pay | Admitting: Family Medicine

## 2020-03-18 DIAGNOSIS — G629 Polyneuropathy, unspecified: Secondary | ICD-10-CM

## 2020-03-19 ENCOUNTER — Ambulatory Visit (INDEPENDENT_AMBULATORY_CARE_PROVIDER_SITE_OTHER): Payer: PPO | Admitting: Family Medicine

## 2020-03-19 ENCOUNTER — Other Ambulatory Visit: Payer: Self-pay | Admitting: Family Medicine

## 2020-03-19 ENCOUNTER — Encounter: Payer: Self-pay | Admitting: Family Medicine

## 2020-03-19 ENCOUNTER — Other Ambulatory Visit: Payer: Self-pay

## 2020-03-19 VITALS — BP 124/72 | HR 78 | Temp 97.2°F | Resp 18 | Ht 77.0 in | Wt 268.0 lb

## 2020-03-19 DIAGNOSIS — E785 Hyperlipidemia, unspecified: Secondary | ICD-10-CM | POA: Diagnosis not present

## 2020-03-19 DIAGNOSIS — H353122 Nonexudative age-related macular degeneration, left eye, intermediate dry stage: Secondary | ICD-10-CM | POA: Diagnosis not present

## 2020-03-19 DIAGNOSIS — Z136 Encounter for screening for cardiovascular disorders: Secondary | ICD-10-CM | POA: Diagnosis not present

## 2020-03-19 DIAGNOSIS — Z0001 Encounter for general adult medical examination with abnormal findings: Secondary | ICD-10-CM

## 2020-03-19 DIAGNOSIS — Z8546 Personal history of malignant neoplasm of prostate: Secondary | ICD-10-CM | POA: Diagnosis not present

## 2020-03-19 DIAGNOSIS — Z0000 Encounter for general adult medical examination without abnormal findings: Secondary | ICD-10-CM

## 2020-03-19 DIAGNOSIS — Z8739 Personal history of other diseases of the musculoskeletal system and connective tissue: Secondary | ICD-10-CM | POA: Diagnosis not present

## 2020-03-19 MED ORDER — OXYCODONE-ACETAMINOPHEN 10-325 MG PO TABS: 1.0000 | ORAL_TABLET | Freq: Three times a day (TID) | ORAL | 0 refills | Status: AC | PRN

## 2020-03-19 MED ORDER — OXYCODONE-ACETAMINOPHEN 10-325 MG PO TABS: 1.0000 | ORAL_TABLET | Freq: Three times a day (TID) | ORAL | 0 refills | Status: DC | PRN

## 2020-03-19 MED ORDER — HYDROCORTISONE 2.5 % EX CREA: TOPICAL_CREAM | Freq: Two times a day (BID) | CUTANEOUS | 2 refills | Status: DC

## 2020-03-19 NOTE — Progress Notes (Signed)
Subjective:    Patient ID: Joshua Dominguez, male    DOB: [birth year], 79 y.o.   MRN: [id number]  HPI   Patient is a very pleasant 79 y/o WM here for his annual wellness visit.  His immunization records are listed below: Immunization History  Administered Date(s) Administered  . Fluad Quad(high Dose 65+) 07/08/2019  . Influenza Split 08/22/2011, 06/13/2013  . Influenza Whole 08/16/2010  . Influenza, High Dose Seasonal PF 07/15/2018  . Influenza-Unspecified 07/20/2016, 06/27/2017  . Moderna SARS-COVID-2 Vaccination 12/19/2019, 01/16/2020  . Pneumococcal Conjugate-13 12/11/2013  . Pneumococcal Polysaccharide-23 09/13/2004, 06/14/2010, 11/27/2011  . Td 04/18/2005  . Tdap 02/01/2016  . Zoster 06/05/2011  . Zoster Recombinat (Shingrix) 03/28/2018    I reviewed his functional assessment.  Patient denies any difficulty performing activities of daily living.  His fall risk discussed with and his depression screening were performed and showed no abnormalities.  Patient's Mini-Mental status exam is completely normal and shows no evidence of early onset dementia.  Last colonoscopy was 2010.  He had a negative cologuard last year.  I calculated his 10-year risk of cardiovascular disease to be greater than 20% based on his cholesterol.  Therefore I recommended a statin.  We discussed this at length and the patient declines a statin for his hyperlipidemia.  Lab on 03/15/2020  Component Date Value Ref Range Status  . WBC 03/15/2020 5.5  3.8 - 10.8 Thousand/uL Final  . RBC 03/15/2020 4.80  4.20 - 5.80 Million/uL Final  . Hemoglobin 03/15/2020 15.4  13.2 - 17.1 g/dL Final  . HCT 03/15/2020 45.7  38.5 - 50.0 % Final  . MCV 03/15/2020 95.2  80.0 - 100.0 fL Final  . MCH 03/15/2020 32.1  27.0 - 33.0 pg Final  . MCHC 03/15/2020 33.7  32.0 - 36.0 g/dL Final  . RDW 03/15/2020 12.6  11.0 - 15.0 % Final  . Platelets 03/15/2020 166  140 - 400 Thousand/uL Final  . MPV 03/15/2020 10.6  7.5 - 12.5 fL Final  .  Neutro Abs 03/15/2020 3,091  1,500 - 7,800 cells/uL Final  . Lymphs Abs 03/15/2020 1,672  850 - 3,900 cells/uL Final  . Absolute Monocytes 03/15/2020 539  200 - 950 cells/uL Final  . Eosinophils Absolute 03/15/2020 149  15 - 500 cells/uL Final  . Basophils Absolute 03/15/2020 50  0 - 200 cells/uL Final  . Neutrophils Relative % 03/15/2020 56.2  % Final  . Total Lymphocyte 03/15/2020 30.4  % Final  . Monocytes Relative 03/15/2020 9.8  % Final  . Eosinophils Relative 03/15/2020 2.7  % Final  . Basophils Relative 03/15/2020 0.9  % Final  . Glucose, Bld 03/15/2020 84  65 - 99 mg/dL Final   Comment: .            Fasting reference interval .   . BUN 03/15/2020 23  7 - 25 mg/dL Final  . Creat 03/15/2020 1.27* 0.70 - 1.18 mg/dL Final   Comment: For patients >42 years of age, the reference limit for Creatinine is approximately 13% higher for people identified as African-American. .   Havery Moros Ratio 03/15/2020 18  6 - 22 (calc) Final  . Sodium 03/15/2020 141  135 - 146 mmol/L Final  . Potassium 03/15/2020 4.5  3.5 - 5.3 mmol/L Final  . Chloride 03/15/2020 107  98 - 110 mmol/L Final  . CO2 03/15/2020 28  20 - 32 mmol/L Final  . Calcium 03/15/2020 9.3  8.6 - 10.3 mg/dL Final  . Total Protein  03/15/2020 6.6  6.1 - 8.1 g/dL Final  . Albumin 03/15/2020 4.1  3.6 - 5.1 g/dL Final  . Globulin 03/15/2020 2.5  1.9 - 3.7 g/dL (calc) Final  . AG Ratio 03/15/2020 1.6  1.0 - 2.5 (calc) Final  . Total Bilirubin 03/15/2020 0.7  0.2 - 1.2 mg/dL Final  . Alkaline phosphatase (APISO) 03/15/2020 56  35 - 144 U/L Final  . AST 03/15/2020 28  10 - 35 U/L Final  . ALT 03/15/2020 21  9 - 46 U/L Final  . Cholesterol 03/15/2020 192  <200 mg/dL Final  . HDL 03/15/2020 34* > OR = 40 mg/dL Final  . Triglycerides 03/15/2020 188* <150 mg/dL Final  . LDL Cholesterol (Calc) 03/15/2020 128* mg/dL (calc) Final   Comment: Reference range: <100 . Desirable range <100 mg/dL for primary prevention;   <70 mg/dL for  patients with CHD or diabetic patients  with > or = 2 CHD risk factors. Marland Kitchen LDL-C is now calculated using the Martin-Hopkins  calculation, which is a validated novel method providing  better accuracy than the Friedewald equation in the  estimation of LDL-C.  Cresenciano Genre et al. Annamaria Helling. [id number]): 2061-2068  (http://education.[facility].com/faq/FAQ164)   . Total CHOL/HDL Ratio 03/15/2020 5.6* <5.0 (calc) Final  . Non-HDL Cholesterol (Calc) 03/15/2020 158* <130 mg/dL (calc) Final   Comment: For patients with diabetes plus 1 major ASCVD risk  factor, treating to a non-HDL-C goal of <100 mg/dL  (LDL-C of <70 mg/dL) is considered a therapeutic  option.   Marland Kitchen PSA 03/15/2020 <0.1  < OR = 4.0 ng/mL Final   Comment: The total PSA value from this assay system is  standardized against the WHO standard. The test  result will be approximately 20% lower when compared  to the equimolar-standardized total PSA (Beckman  Coulter). Comparison of serial PSA results should be  interpreted with this fact in mind. . This test was performed using the Siemens  chemiluminescent method. Values obtained from  different assay methods cannot be used interchangeably. PSA levels, regardless of value, should not be interpreted as absolute evidence of the presence or absence of disease.      Past Medical History:  Diagnosis Date  . Allergic rhinitis   . Bilateral hydrocele   . DDD (degenerative disc disease), lumbar     L3-L4  . ED (erectile dysfunction) of organic origin   . Gout   . Hemorrhoids   . History of prostatitis   . Lumbar herniated disc 2007   L4 and L5 did not require surgery  . Prostate cancer ([place])    S/P  RADIOACTIVE SEED IMPLANTS 05-09-2013   Past Surgical History:  Procedure Laterality Date  . APPENDECTOMY  FEB 2000  . CATARACT EXTRACTION W/ INTRAOCULAR LENS  IMPLANT, BILATERAL    . HYDROCELE EXCISION Bilateral 01/02/2014   Procedure: BILATERAL HYDROCELECTOMY;  Surgeon: Claybon Jabs, MD;  Location: [facility];  Service: Urology;  Laterality: Bilateral;  . HYDROCELE EXCISION Left 04/17/2014   Procedure: LEFT HYDROCELECTOMY ADULT;  Surgeon: Claybon Jabs, MD;  Location: [facility];  Service: Urology;  Laterality: Left;  . KNEE ARTHROSCOPY Left   . RADIOACTIVE SEED IMPLANT N/A 05/09/2013   Procedure: RADIOACTIVE SEED IMPLANT;  Surgeon: Claybon Jabs, MD;  Location: [facility];  Service: Urology;  Laterality: N/A;   Current Outpatient Medications on File Prior to Visit  Medication Sig Dispense Refill  . amitriptyline (ELAVIL) 50 MG tablet TAKE 1 TABLET BY MOUTH  DAILY AT BEDTIME 90 tablet 3  . colchicine 0.6 MG tablet Take two by mouth now then one by mouth in one hour if no better. 10 tablet 0  . Febuxostat 80 MG TABS TAKE 1 TABLET BY MOUTH EVERY DAY 90 tablet 1  . gabapentin (NEURONTIN) 300 MG capsule TAKE 3 CAPSULES BY MOUTH EVERY MORNING AND 2 AT BEDTIME AS DIRECTED 450 capsule 0  . glucosamine-chondroitin 500-400 MG tablet Take 1 tablet by mouth 2 (two) times daily.     . Multiple Vitamin (MULTIVITAMIN) tablet Take 1 tablet by mouth daily.     . Omega-3 Fatty Acids (FISH OIL) 1200 MG CAPS Take 1 capsule by mouth 2 (two) times daily.      No current facility-administered medications on file prior to visit.   Allergies  Allergen Reactions  . Celebrex [Celecoxib] Rash  . Oxymetazoline Hcl Rash   Social History   Socioeconomic History  . Marital status: Married    Spouse name: Not on file  . Number of children: Not on file  . Years of education: Not on file  . Highest education level: Not on file  Occupational History  . Occupation: Consulting/professor  Tobacco Use  . Smoking status: Former Smoker    Types: Pipe    Quit date: 11/13/1976    Years since quitting: 43.3  . Smokeless tobacco: Never Used  Substance and Sexual Activity  . Alcohol use: Yes    Alcohol/week: 14.0 standard drinks    Types: 14  Glasses of wine per week  . Drug use: No  . Sexual activity: Not on file  Other Topics Concern  . 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 Difficulty of Paying Living Expenses:   Food Insecurity:   . Worried About Charity fundraiser in the Last Year:   . Arboriculturist in the Last Year:   Transportation Needs:   . Film/video editor (Medical):   Marland Kitchen Lack of Transportation (Non-Medical):   Physical Activity:   . Days of Exercise per Week:   . Minutes of Exercise per Session:   Stress:   . Feeling of Stress :   Social Connections:   . Frequency of Communication with Friends and Family:   . Frequency of Social Gatherings with Friends and Family:   . Attends Religious Services:   . Active Member of Clubs or Organizations:   . Attends Archivist Meetings:   Marland Kitchen Marital Status:   Intimate Partner Violence:   . Fear of Current or Ex-Partner:   . Emotionally Abused:   Marland Kitchen Physically Abused:   . Sexually Abused:    Family History  Problem Relation Age of Onset  . Hypertension Father   . Gout Father   . Prostate cancer Father   . Lung cancer Mother        +smoker  . Lung cancer Maternal Grandfather        -smoker  . Heart disease Sister   . Heart disease Brother       Review of Systems  All other systems reviewed and are negative.      Objective:   Physical Exam  Constitutional: He is oriented to person, place, and time. He appears well-developed and well-nourished. No distress.  HENT:  Head: Normocephalic and atraumatic.  Right Ear: External ear normal.  Left Ear: External ear normal.  Nose: Nose normal.  Mouth/Throat: Oropharynx is clear and moist. No  oropharyngeal exudate.  Eyes: Pupils are equal, round, and reactive to light. Conjunctivae and EOM are normal. Right eye exhibits no discharge. Left eye exhibits no discharge. No scleral icterus.  Neck: No JVD present. No thyromegaly present.  Cardiovascular: Normal rate, regular rhythm and normal heart sounds. Exam reveals no gallop and no friction rub.  No murmur heard. Pulmonary/Chest: Effort normal and breath sounds normal. No stridor. No respiratory distress. He has no wheezes. He has no rales. He exhibits no tenderness.  Abdominal: Soft. Bowel sounds are normal. He exhibits no distension and no mass. There is no abdominal tenderness. There is no rebound and no guarding.  Musculoskeletal:        General: No tenderness or edema. Normal range of motion.     Cervical back: Normal range of motion and neck supple.  Lymphadenopathy:    He has no cervical adenopathy.  Neurological: He is alert and oriented to person, place, and time. He has normal reflexes. No cranial nerve deficit. He exhibits normal muscle tone. Coordination normal.  Skin: Skin is warm. No rash noted. He is not diaphoretic. No erythema. No pallor.  Psychiatric: He has a normal mood and affect. His behavior is normal. Judgment and thought content normal.  Vitals reviewed.         Assessment & Plan:  Personal history of prostate cancer  General medical exam  History of gout  Dyslipidemia  Encounter for abdominal aortic aneurysm (AAA) screening - Plan: VAS [place] AAA DUPLEX  Lab work is outstanding except for his cholesterol.  Recommended a statin given his greater than 10% risk of cardiovascular disease in the next 10 years however patient politely declines.  PSA is undetectable.  Cologuard is up-to-date.  Immunizations are up-to-date.  Patient continues to have severe pain in his feet due to a combination of nerve damage as well as plantar fasciitis.  He would like to take Percocet at night on a rare occasion to help him  sleep.  He believes 30 tablets would last all year.  I will be glad to give him his medication for that rare occasion.

## 2020-03-19 NOTE — Telephone Encounter (Signed)
Patient called back asking if you could send his prescription for his oxycodone to [facility] in [place]?

## 2020-04-24 ENCOUNTER — Other Ambulatory Visit: Payer: Self-pay | Admitting: Family Medicine

## 2020-04-24 DIAGNOSIS — G629 Polyneuropathy, unspecified: Secondary | ICD-10-CM

## 2020-04-26 NOTE — Telephone Encounter (Signed)
Last refilled: 02/09/2020 Last office visit: 03/19/2020

## 2020-05-09 ENCOUNTER — Other Ambulatory Visit: Payer: Self-pay | Admitting: Family Medicine

## 2020-05-10 ENCOUNTER — Other Ambulatory Visit: Payer: Self-pay

## 2020-05-10 MED ORDER — FEBUXOSTAT 80 MG PO TABS: 1.0000 | ORAL_TABLET | Freq: Every day | ORAL | 0 refills | Status: DC

## 2020-05-31 ENCOUNTER — Encounter: Payer: Self-pay | Admitting: Dermatology

## 2020-05-31 ENCOUNTER — Ambulatory Visit: Payer: PPO | Admitting: Dermatology

## 2020-05-31 ENCOUNTER — Other Ambulatory Visit: Payer: Self-pay

## 2020-05-31 DIAGNOSIS — L821 Other seborrheic keratosis: Secondary | ICD-10-CM | POA: Diagnosis not present

## 2020-05-31 DIAGNOSIS — D224 Melanocytic nevi of scalp and neck: Secondary | ICD-10-CM | POA: Diagnosis not present

## 2020-05-31 DIAGNOSIS — L820 Inflamed seborrheic keratosis: Secondary | ICD-10-CM | POA: Diagnosis not present

## 2020-05-31 DIAGNOSIS — D485 Neoplasm of uncertain behavior of skin: Secondary | ICD-10-CM

## 2020-05-31 NOTE — Progress Notes (Signed)
   Follow-Up Visit   Subjective  Joshua Dominguez is a 79 y.o. male who presents for the following: bumps x 2 (scalp x 3 months, growing, hits with brush). Would like them removed.  The following portions of the chart were reviewed this encounter and updated as appropriate:      Review of Systems:  No other skin or systemic complaints except as noted in HPI or Assessment and Plan.  Objective  Well appearing patient in no apparent distress; mood and affect are within normal limits.  A focused examination was performed including face, scalp. Relevant physical exam findings are noted in the Assessment and Plan.  Objective  Left Crown (2): Erythematous keratotic waxy stuck-on papule x 2  Objective  Left Upper Parietal Scalp: 6.44mm firm flesh papule (inferior to ISK)   Assessment & Plan   Seborrheic Keratoses - Stuck-on, waxy, tan-brown papules face  - Discussed benign etiology and prognosis. - Observe - Call for any changes  Inflamed seborrheic keratosis (2) Left Crown  Cryotherapy today x 2  Destruction of lesion - Left Crown  Destruction method: cryotherapy   Informed consent: discussed and consent obtained   Lesion destroyed using liquid nitrogen: Yes   Region frozen until ice ball extended beyond lesion: Yes   Outcome: patient tolerated procedure well with no complications   Post-procedure details: wound care instructions given    Neoplasm of uncertain behavior of skin Left Upper Parietal Scalp  Epidermal / dermal shaving  Lesion diameter (cm):  0.6 Informed consent: discussed and consent obtained   Patient was prepped and draped in usual sterile fashion: Area prepped with alcohol. Anesthesia: the lesion was anesthetized in a standard fashion   Anesthetic:  1% lidocaine w/ epinephrine 1-100,000 buffered w/ 8.4% NaHCO3 Instrument used: flexible razor blade   Hemostasis achieved with: pressure, aluminum chloride and electrodesiccation   Outcome: patient tolerated  procedure well   Post-procedure details: wound care instructions given   Post-procedure details comment:  Ointment and small bandage applied Additional details:  Post tx defect 0.9 cm  Specimen 1 - Surgical pathology Differential Diagnosis: Irritated nevus vs other Check Margins: No 6.34mm firm flesh papule  Return if symptoms worsen or fail to improve.   IJamesetta Orleans, CMA, am acting as scribe for Brendolyn Patty, MD .  Documentation: I have reviewed the above documentation for accuracy and completeness, and I agree with the above.  Brendolyn Patty MD

## 2020-05-31 NOTE — Patient Instructions (Addendum)
Cryotherapy Aftercare  . Wash gently with soap and water everyday.   Marland Kitchen Apply Vaseline and Band-Aid daily until healed.   Wound Care Instructions  1. Cleanse wound gently with soap and water once a day then pat dry with clean gauze. Apply a thing coat of Petrolatum (petroleum jelly, "Vaseline") over the wound (unless you have an allergy to this). We recommend that you use a new, sterile tube of Vaseline. Do not pick or remove scabs. Do not remove the yellow or white "healing tissue" from the base of the wound.  2. You should call the office for your biopsy report after 1 week if you have not already been contacted.  3. If you experience any problems, such as abnormal amounts of bleeding, swelling, significant bruising, significant pain, or evidence of infection, please call the office immediately.

## 2020-06-03 ENCOUNTER — Telehealth: Payer: Self-pay

## 2020-06-03 NOTE — Telephone Encounter (Signed)
Left message for patient to return call to review biopsy results, JS

## 2020-06-07 ENCOUNTER — Telehealth: Payer: Self-pay

## 2020-06-07 NOTE — Telephone Encounter (Signed)
Lft pt msg to call for bx resutl/sh

## 2020-06-07 NOTE — Telephone Encounter (Signed)
-----   Message from Brendolyn Patty, MD sent at 06/02/2020  5:09 PM EDT ----- Skin , left upper parietal scalp MELANOCYTIC NEVUS, INTRADERMAL TYPE, DEEP MARGIN INVOLVED  Benign mole, may recur

## 2020-06-09 NOTE — Telephone Encounter (Signed)
Advised pt of bx results/sh ?

## 2020-07-17 ENCOUNTER — Other Ambulatory Visit: Payer: Self-pay | Admitting: Family Medicine

## 2020-07-17 DIAGNOSIS — G629 Polyneuropathy, unspecified: Secondary | ICD-10-CM

## 2020-08-05 ENCOUNTER — Other Ambulatory Visit: Payer: Self-pay | Admitting: Family Medicine

## 2020-10-01 ENCOUNTER — Other Ambulatory Visit: Payer: Self-pay | Admitting: Family Medicine

## 2020-10-01 DIAGNOSIS — G629 Polyneuropathy, unspecified: Secondary | ICD-10-CM

## 2020-10-01 NOTE — Telephone Encounter (Signed)
Please Advise

## 2020-10-22 ENCOUNTER — Ambulatory Visit (INDEPENDENT_AMBULATORY_CARE_PROVIDER_SITE_OTHER): Payer: PPO | Admitting: Family Medicine

## 2020-10-22 ENCOUNTER — Encounter: Payer: Self-pay | Admitting: Family Medicine

## 2020-10-22 ENCOUNTER — Other Ambulatory Visit: Payer: Self-pay

## 2020-10-22 VITALS — BP 140/80 | HR 64 | Temp 96.6°F | Ht 76.5 in | Wt 273.8 lb

## 2020-10-22 DIAGNOSIS — E785 Hyperlipidemia, unspecified: Secondary | ICD-10-CM

## 2020-10-22 DIAGNOSIS — E6609 Other obesity due to excess calories: Secondary | ICD-10-CM | POA: Diagnosis not present

## 2020-10-22 DIAGNOSIS — M1A0720 Idiopathic chronic gout, left ankle and foot, without tophus (tophi): Secondary | ICD-10-CM

## 2020-10-22 DIAGNOSIS — Z6832 Body mass index (BMI) 32.0-32.9, adult: Secondary | ICD-10-CM

## 2020-10-22 DIAGNOSIS — Z7689 Persons encountering health services in other specified circumstances: Secondary | ICD-10-CM

## 2020-10-22 MED ORDER — COLCHICINE 0.6 MG PO TABS: 0.6000 mg | ORAL_TABLET | ORAL | 1 refills | Status: DC | PRN

## 2020-10-22 NOTE — Progress Notes (Signed)
Subjective:    Patient ID: Joshua Dominguez, male    DOB: January 23, 1941, 79 y.o.   MRN: [id number]  HPI Chief Complaint  Patient presents with  . Establish Care    No concerns    This is a 79 yo male, prior patient of Dr. Dennard Schaumann, who presents today to establish care.    Last CPE- 03/19/2020 PSA- 03/15/2020 Colonoscopy- 03/17/2019, Cologuard done 03/06/2019 Tdap- 02/01/2016 Flu- annual Dental-regular Eye-regular Exercise- swims and runs 3x/ week, walking/ hiking/ weights  Gout- left wrist, left foot, takes febuxostat 80 mg, last uric acid 3.7 02/20/2019, flares 1-2x/ year, no identifiable triggers. Takes colchicine as needed and requests refill.   CAD risk-reviewed ASCVD calculation as well as high triglycerides, low HDL with patient The 10-year ASCVD risk score Mikey Bussing DC Jr., et al., 2013) is: 34.6%   Values used to calculate the score:     Age: 21 years     Sex: Male     Is Non-Hispanic African American: No     Diabetic: No     Tobacco smoker: No     Systolic Blood Pressure: 650 mmHg     Is BP treated: No     HDL Cholesterol: 34 mg/dL     Total Cholesterol: 192 mg/dL  Review of Systems Denies headache, visual change, chest pain, shortness of breath, abdominal pain, diarrhea/constipation, dysuria, hematuria, urinary frequency    Objective:   Physical Exam Physical Exam  Constitutional: Oriented to person, place, and time. Appears well-developed and well-nourished.  HENT:  Head: Normocephalic and atraumatic.  Eyes: Conjunctivae are normal.  Neck: Normal range of motion. Neck supple.  Cardiovascular: Normal rate, regular rhythm and normal heart sounds.   Pulmonary/Chest: Effort normal and breath sounds normal.  Musculoskeletal: No lower extremity edema.   Neurological: Alert and oriented to person, place, and time.  Skin: Skin is warm and dry.  Psychiatric: Normal mood and affect. Behavior is normal. Judgment and thought content normal.  Vitals reviewed.    BP 140/80    Pulse 64   Temp (!) 96.6 F (35.9 C) (Temporal)   Ht 6' 4.5" (1.943 m)   Wt 273 lb 12 oz (124.2 kg)   SpO2 98%   BMI 32.89 kg/m  Wt Readings from Last 3 Encounters:  10/22/20 273 lb 12 oz (124.2 kg)  03/19/20 268 lb (121.6 kg)  02/20/19 269 lb (122 kg)        Assessment & Plan:  1. Encounter to establish care - reviewed available records in EMR  2. Idiopathic chronic gout of left foot without tophus -He is currently asymptomatic and requests refill of colchicine to have at home for flares - colchicine 0.6 MG tablet; Take 1 tablet (0.6 mg total) by mouth as needed. Take two by mouth now then one by mouth in one hour if no better.  Dispense: 6 tablet; Refill: 1  3. Dyslipidemia (high LDL; low HDL) -Reviewed ASCVD risk score with him and discussed elevated triglycerides, LDL and low HDL. He is not interested in starting medication at this time. Dietary recommendations made. -Follow-up in 6 months for repeat blood work  4. Class 1 obesity due to excess calories with serious comorbidity and body mass index (BMI) of 32.0 to 32.9 in adult -Provided written and verbal information regarding diet and encouraged him to reduce processed carbohydrates  This visit occurred during the SARS-CoV-2 public health emergency.  Safety protocols were in place, including screening questions prior to the visit, additional usage of  staff PPE, and extensive cleaning of exam room while observing appropriate contact time as indicated for disinfecting solutions.      Clarene Reamer, FNP-BC  [facility], [facility]  10/22/2020 2:34 PM

## 2020-10-22 NOTE — Patient Instructions (Signed)
Good to see you today  There is not one right eating plan for everyone.  It may take trial and error to find what will work for you.  It is important to get adequate protein and fiber with your meals.  It is okay to not eat breakfast or to skip meals if you are not hungry.  Avoid snacking between meals.  Unless you are on a fluid restriction, drink 80 to 90 ounces of water a day.  Suggested resources- www.dietdoctor.com/diabetes/diet www.adaptyourlifeacademy.com-there is a quiz to help you determine how many carbohydrates you should eat a day  www.thefastingmethod.com  Here are some guidelines to help you with meal planning -  Avoid all processed and packaged foods (bread, pasta, crackers, chips, etc) and beverages containing calories.  Avoid added sugars and excessive natural sugars.  Pay attention to how you feel if you consume artificial sweeteners.  Do they make you more hungry or raise your blood sugar?  With every meal and snack, aim to get 20 g of protein (3 ounces of meat, 4 ounces of fish, 3 eggs, protein powder, 1 cup [place] yogurt, 1 cup cottage cheese, etc.)  Increase fiber in the form of non-starchy vegetables.  These help you feel full with very little carbohydrates and are good for gut health.  Nonstarchy vegetables include summer squash, onions, peppers, tomatoes, eggplant, broccoli, cauliflower, cabbage, lettuce, spinach.  Have small amounts of good fats such as avocado, nuts, olive oil, nut butters, olives.  Add a little cheese to your meals to make them tasty.   Try to plan your meals for the week and do some meal preparation when able.  If possible, make lunches for the week ahead of time.  Plan a couple of dinners and make enough so you can have leftovers.  Build in a treat once a week.

## 2020-11-08 ENCOUNTER — Other Ambulatory Visit: Payer: Self-pay | Admitting: Family Medicine

## 2021-01-27 ENCOUNTER — Encounter: Payer: PPO | Admitting: Internal Medicine

## 2021-03-07 DIAGNOSIS — M722 Plantar fascial fibromatosis: Secondary | ICD-10-CM | POA: Diagnosis not present

## 2021-03-07 DIAGNOSIS — M6702 Short Achilles tendon (acquired), left ankle: Secondary | ICD-10-CM | POA: Diagnosis not present

## 2021-03-08 ENCOUNTER — Other Ambulatory Visit: Payer: Self-pay | Admitting: Family Medicine

## 2021-03-08 DIAGNOSIS — G629 Polyneuropathy, unspecified: Secondary | ICD-10-CM

## 2021-03-24 ENCOUNTER — Encounter: Payer: PPO | Admitting: Internal Medicine

## 2021-04-13 DIAGNOSIS — M79672 Pain in left foot: Secondary | ICD-10-CM | POA: Diagnosis not present

## 2021-04-13 DIAGNOSIS — M25572 Pain in left ankle and joints of left foot: Secondary | ICD-10-CM | POA: Diagnosis not present

## 2021-05-09 ENCOUNTER — Other Ambulatory Visit: Payer: Self-pay | Admitting: Family Medicine

## 2021-05-09 DIAGNOSIS — M25572 Pain in left ankle and joints of left foot: Secondary | ICD-10-CM | POA: Diagnosis not present

## 2021-05-09 DIAGNOSIS — M79672 Pain in left foot: Secondary | ICD-10-CM | POA: Diagnosis not present

## 2021-05-10 ENCOUNTER — Other Ambulatory Visit: Payer: Self-pay

## 2021-05-10 MED ORDER — FEBUXOSTAT 80 MG PO TABS: ORAL_TABLET | ORAL | 1 refills | Status: DC

## 2021-05-20 DIAGNOSIS — M722 Plantar fascial fibromatosis: Secondary | ICD-10-CM | POA: Diagnosis not present

## 2021-05-20 DIAGNOSIS — M6702 Short Achilles tendon (acquired), left ankle: Secondary | ICD-10-CM | POA: Diagnosis not present

## 2021-05-24 ENCOUNTER — Other Ambulatory Visit (HOSPITAL_COMMUNITY): Payer: Self-pay | Admitting: Orthopedic Surgery

## 2021-05-24 ENCOUNTER — Encounter: Payer: PPO | Admitting: Adult Health

## 2021-05-28 DIAGNOSIS — L0390 Cellulitis, unspecified: Secondary | ICD-10-CM | POA: Diagnosis not present

## 2021-05-31 ENCOUNTER — Other Ambulatory Visit: Payer: Self-pay

## 2021-05-31 ENCOUNTER — Emergency Department: Payer: PPO

## 2021-05-31 ENCOUNTER — Emergency Department
Admission: EM | Admit: 2021-05-31 | Discharge: 2021-05-31 | Disposition: A | Discharge status: Home / Self Care | Payer: PPO | Attending: Emergency Medicine | Admitting: Emergency Medicine

## 2021-05-31 DIAGNOSIS — Z8546 Personal history of malignant neoplasm of prostate: Secondary | ICD-10-CM | POA: Diagnosis not present

## 2021-05-31 DIAGNOSIS — K5790 Diverticulosis of intestine, part unspecified, without perforation or abscess without bleeding: Secondary | ICD-10-CM | POA: Diagnosis not present

## 2021-05-31 DIAGNOSIS — Z87891 Personal history of nicotine dependence: Secondary | ICD-10-CM | POA: Diagnosis not present

## 2021-05-31 DIAGNOSIS — R311 Benign essential microscopic hematuria: Secondary | ICD-10-CM | POA: Diagnosis not present

## 2021-05-31 DIAGNOSIS — R1111 Vomiting without nausea: Secondary | ICD-10-CM | POA: Diagnosis not present

## 2021-05-31 DIAGNOSIS — R109 Unspecified abdominal pain: Secondary | ICD-10-CM | POA: Insufficient documentation

## 2021-05-31 DIAGNOSIS — M545 Low back pain, unspecified: Secondary | ICD-10-CM

## 2021-05-31 DIAGNOSIS — I700 Atherosclerosis of aorta: Secondary | ICD-10-CM | POA: Diagnosis not present

## 2021-05-31 DIAGNOSIS — N3289 Other specified disorders of bladder: Secondary | ICD-10-CM | POA: Diagnosis not present

## 2021-05-31 DIAGNOSIS — M5459 Other low back pain: Secondary | ICD-10-CM | POA: Diagnosis not present

## 2021-05-31 LAB — URINALYSIS, COMPLETE (UACMP) WITH MICROSCOPIC
Bacteria, UA: NONE SEEN
Bilirubin Urine: NEGATIVE
Glucose, UA: NEGATIVE mg/dL
Ketones, ur: NEGATIVE mg/dL
Leukocytes,Ua: NEGATIVE
Nitrite: NEGATIVE
Protein, ur: NEGATIVE mg/dL
Specific Gravity, Urine: 1.016 (ref 1.005–1.030)
Squamous Epithelial / HPF: NONE SEEN (ref 0–5)
pH: 5 (ref 5.0–8.0)

## 2021-05-31 MED ORDER — OXYCODONE-ACETAMINOPHEN 5-325 MG PO TABS
2.0000 | ORAL_TABLET | Freq: Once | ORAL | Status: AC
  Administered 2021-05-31: 2 via ORAL
  Filled 2021-05-31: qty 2

## 2021-05-31 NOTE — ED Provider Notes (Signed)
[facility] Emergency Department Provider Note   ____________________________________________   Event Date/Time   First MD Initiated Contact with Patient 05/31/21 1235     (approximate)  I have reviewed the triage vital signs and the nursing notes.   HISTORY  Chief Complaint Back Pain    HPI Joshua Dominguez is a 80 y.o. male with the below stated past medical history who presents for left flank/lower lumbar pain  LOCATION: Left lower lumbar spine/left flank DURATION: 3 days prior to arrival TIMING: Slightly improved since onset SEVERITY: 8/10 QUALITY: Aching/sharp CONTEXT: Patient states that pain came on rather gradually approximately 3 days ago and is now radiating around the left side into his left groin MODIFYING FACTORS: Denies any exacerbating or relieving factors ASSOCIATED SYMPTOMS: Mild nausea   Per medical record review patient has history of lumbar herniated disks at L4/L5 as well as a history of prostate cancer with radioactive seeds implanted 05/09/2013          Past Medical History:  Diagnosis Date   Allergic rhinitis    Bilateral hydrocele    DDD (degenerative disc disease), lumbar     L3-L4   ED (erectile dysfunction) of organic origin    Gout    Hemorrhoids    History of prostatitis    Lumbar herniated disc 2007   L4 and L5 did not require surgery   Prostate cancer ([place])    S/P  RADIOACTIVE SEED IMPLANTS 05-09-2013    Patient Active Problem List   Diagnosis Date Noted   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   Plantar fasciitis of left foot 02/09/2016   Hydrocele, bilateral 01/01/2014   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 and 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 02/05/2013   Neck pain 04/28/2011   Left foot pain 04/28/2011   DYSLIPIDEMIA 06/14/2010   CERUMEN IMPACTION, BILATERAL 08/08/2007   GOUT 07/24/2007   SINUSITIS, CHRONIC 07/24/2007   ALLERGIC RHINITIS 07/24/2007   DEGENERATIVE DISC DISEASE,  LUMBAR SPINE 07/24/2007   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   ELEVATED BLOOD PRESSURE WITHOUT DIAGNOSIS OF HYPERTENSION 07/24/2007    Past Surgical History:  Procedure Laterality Date   APPENDECTOMY  FEB 2000   CATARACT EXTRACTION W/ INTRAOCULAR LENS  IMPLANT, BILATERAL     HYDROCELE EXCISION Bilateral 01/02/2014   Procedure: BILATERAL HYDROCELECTOMY;  Surgeon: Claybon Jabs, MD;  Location: [place];  Service: Urology;  Laterality: Bilateral;   HYDROCELE EXCISION Left 04/17/2014   Procedure: LEFT HYDROCELECTOMY ADULT;  Surgeon: Claybon Jabs, MD;  Location: [facility];  Service: Urology;  Laterality: Left;   KNEE ARTHROSCOPY Left    RADIOACTIVE SEED IMPLANT N/A 05/09/2013   Procedure: RADIOACTIVE SEED IMPLANT;  Surgeon: Claybon Jabs, MD;  Location: [facility];  Service: Urology;  Laterality: N/A;    Prior to Admission medications   Medication Sig Start Date End Date Taking? Authorizing Provider  amitriptyline (ELAVIL) 50 MG tablet TAKE 1 TABLET BY MOUTH DAILY AT BEDTIME 03/08/21   Susy Frizzle, MD  colchicine 0.6 MG tablet Take 1 tablet (0.6 mg total) by mouth as needed. Take two by mouth now then one by mouth in one hour if no better. 10/22/20   Elby Beck, FNP  Febuxostat 80 MG TABS TAKE 1 TABLET(80 MG) BY MOUTH DAILY 05/10/21   Susy Frizzle, MD  gabapentin (NEURONTIN) 300 MG capsule TAKE 3 CAPSULES BY MOUTH EVERY MORNING AND 2 AT BEDTIME AS DIRECTED  10/01/20   Susy Frizzle, MD  glucosamine-chondroitin 500-400 MG tablet Take 1 tablet by mouth 2 (two) times daily.    [provider]  Multiple Vitamin (MULTIVITAMIN) tablet Take 1 tablet by mouth daily.    [provider]  Omega-3 Fatty Acids (FISH OIL) 1200 MG CAPS Take 1 capsule by mouth 2 (two) times daily.    [provider]    Allergies Celebrex [celecoxib] and Oxymetazoline hcl  Family History  Problem Relation Age of Onset   Hypertension  Father    Gout Father    Prostate cancer Father    Lung cancer Mother        +smoker   Lung cancer Maternal Grandfather        -smoker   Heart disease Sister    Heart disease Brother     Social History Social History   Tobacco Use   Smoking status: Former    Types: Pipe    Quit date: 11/13/1976    Years since quitting: 44.5   Smokeless tobacco: Never  Vaping Use   Vaping Use: Never used  Substance Use Topics   Alcohol use: Yes    Alcohol/week: 14.0 standard drinks    Types: 14 Glasses of wine per week   Drug use: No    Review of Systems Constitutional: No fever/chills Eyes: No visual changes. ENT: No sore throat. Cardiovascular: Denies chest pain. Respiratory: Denies shortness of breath. Gastrointestinal: No abdominal pain.  No nausea, no vomiting.  No diarrhea. Genitourinary: Negative for dysuria. Musculoskeletal: Endorses left flank and back pain Skin: Negative for rash. Neurological: Negative for headaches, weakness/numbness/paresthesias in any extremity Psychiatric: Negative for suicidal ideation/homicidal ideation   ____________________________________________   PHYSICAL EXAM:  VITAL SIGNS: ED Triage Vitals [05/31/21 1147]  Enc Vitals Group     BP 134/90     Pulse Rate 63     Resp 18     Temp 97.6 F (36.4 C)     Temp Source Oral     SpO2 95 %     Weight 265 lb (120.2 kg)     Height 6\' 5"  (1.956 m)     Head Circumference      Peak Flow      Pain Score 8     Pain Loc      Pain Edu?      Excl. in [place]?    Constitutional: Alert and oriented. Well appearing and in no acute distress. Eyes: Conjunctivae are normal. PERRL. Head: Atraumatic. Nose: No congestion/rhinnorhea. Mouth/Throat: Mucous membranes are moist. Neck: No stridor Cardiovascular: Grossly normal heart sounds.  Good peripheral circulation. Respiratory: Normal respiratory effort.  No retractions. Gastrointestinal: Soft and nontender. No distention. Musculoskeletal: Left CVA tenderness  to percussion, left lower lumbar paraspinal musculature tenderness palpation.  No obvious deformities Neurologic:  Normal speech and language. No gross focal neurologic deficits are appreciated. Skin:  Skin is warm and dry. No rash noted. Psychiatric: Mood and affect are normal. Speech and behavior are normal.  ____________________________________________   LABS (all labs ordered are listed, but only abnormal results are displayed)  Labs Reviewed  URINALYSIS, COMPLETE (UACMP) WITH MICROSCOPIC - Abnormal; Notable for the following components:      Result Value   Color, Urine YELLOW (*)    APPearance HAZY (*)    Hgb urine dipstick MODERATE (*)    All other components within normal limits    RADIOLOGY  ED MD interpretation: CT of the abdomen and pelvis without contrast shows  diverticulosis without diverticulitis and no renal calculi or obstructive changes noted  Official radiology report(s): CT Renal Stone Study  Result Date: 05/31/2021 CLINICAL DATA:  Left-sided flank pain with vomiting, initial encounter EXAM: CT ABDOMEN AND PELVIS WITHOUT CONTRAST TECHNIQUE: Multidetector CT imaging of the abdomen and pelvis was performed following the standard protocol without IV contrast. COMPARISON:  None. FINDINGS: Lower chest: Mild left basilar scarring is seen. Hepatobiliary: Gallbladder is within normal limits. Scattered hypodensities are noted throughout the liver likely representing cysts but incompletely evaluated on this exam. The largest of these measures approximately 2.6 cm in the right lobe near the dome of the diaphragm. Pancreas: Unremarkable. No pancreatic ductal dilatation or surrounding inflammatory changes. Spleen: Normal in size without focal abnormality. Adrenals/Urinary Tract: Adrenal glands are within normal limits. Kidneys are well visualized bilaterally. No renal calculi are noted. Hypodensity in the posterior aspect of the right kidney is noted likely representing a cyst. It  measures 2 cm in greatest length. No ureteral obstructive changes are seen. The bladder is partially distended. Stomach/Bowel: Scattered diverticular change of the colon is noted without evidence of diverticulitis. The appendix has been surgically removed. Small bowel and stomach are within normal limits. Vascular/Lymphatic: Aortic atherosclerosis. No enlarged abdominal or pelvic lymph nodes. Reproductive: Prostate shows treatment seeds in place. Other: No abdominal wall hernia or abnormality. No abdominopelvic ascites. Musculoskeletal: No acute or significant osseous findings. IMPRESSION: Diverticulosis without diverticulitis. No renal calculi or obstructive changes are noted. Hypodensities within the liver and kidneys likely representing cysts. Electronically Signed   By: Inez Catalina M.D.   On: 05/31/2021 14:04    ____________________________________________   PROCEDURES  Procedure(s) performed (including Critical Care):  Procedures   ____________________________________________   INITIAL IMPRESSION / ASSESSMENT AND PLAN / ED COURSE  As part of my medical decision making, I reviewed the following data within the electronic medical record, if available:  Nursing notes reviewed and incorporated, Labs reviewed, EKG interpreted, Old chart reviewed, Radiograph reviewed and Notes from prior ED visits reviewed and incorporated        Patient presents for low back pain. Given History and Exam the patient appears to be at low risk for Spinal Cord Compression Syndrome, Vertebral Malignancy/Mets, acute Spinal Fracture, Vertebral Osteomyelitis, Epidural Abscess, Infected or Obstructing Kidney Stone.  Their presentation appears most likely to be secondary to non-emergent musculoskeletal etiology vs non-emergent disc herniation.  ED Workup: CT renal stone study was performed and did not show any evidence of acute abnormalities however patient did have a UA that showed moderate hematuria concerning  for possibly having passed a kidney stone given constellation of symptoms.  Disposition: Discharge. Strict return precautions discussed with patient with full understanding. Advised patient to follow up promptly with primary care provider      ____________________________________________   FINAL CLINICAL IMPRESSION(S) / ED DIAGNOSES  Final diagnoses:  Acute left-sided low back pain without sciatica  Acute left flank pain  Benign essential microscopic hematuria     ED Discharge Orders     None        Note:  This document was prepared using Dragon voice recognition software and may include unintentional dictation errors.    Naaman Plummer, MD 05/31/21 [phone]

## 2021-05-31 NOTE — ED Notes (Signed)
See triage note  Presents with a red area to right forearm  States he noticed area couple of days ago  Then today developed back spasms and then sudden onset of sharp left flank pain  Became nauseated

## 2021-05-31 NOTE — ED Triage Notes (Signed)
Pt here with left side pain and a back spasm. Pt has not has one in 15 years and is very active and is concerned. Pt stable in triage.

## 2021-06-03 ENCOUNTER — Encounter (HOSPITAL_BASED_OUTPATIENT_CLINIC_OR_DEPARTMENT_OTHER): Payer: Self-pay | Admitting: Orthopedic Surgery

## 2021-06-03 ENCOUNTER — Other Ambulatory Visit: Payer: Self-pay

## 2021-06-04 ENCOUNTER — Other Ambulatory Visit: Payer: Self-pay | Admitting: Family Medicine

## 2021-06-04 DIAGNOSIS — G629 Polyneuropathy, unspecified: Secondary | ICD-10-CM

## 2021-06-07 ENCOUNTER — Other Ambulatory Visit: Payer: Self-pay

## 2021-06-07 DIAGNOSIS — G629 Polyneuropathy, unspecified: Secondary | ICD-10-CM

## 2021-06-07 MED ORDER — GABAPENTIN 300 MG PO CAPS: ORAL_CAPSULE | ORAL | 2 refills | Status: DC

## 2021-06-07 NOTE — Telephone Encounter (Signed)
Name of Medication: Gabapentin   Last Fill or Written Date and Quantity: 10/01/20 # 450 2 ref Last Office Visit and Type: 10/22/20 with Tor Netters Next Office Visit and Type: 07/25/21 TOC with Sharyn Lull at [facility]  Patient states he needs this today please.

## 2021-06-09 ENCOUNTER — Other Ambulatory Visit: Payer: Self-pay

## 2021-06-09 ENCOUNTER — Encounter (HOSPITAL_BASED_OUTPATIENT_CLINIC_OR_DEPARTMENT_OTHER)
Admission: RE | Disposition: A | Discharge status: Home / Self Care | Payer: Self-pay | Source: Home / Self Care | Attending: Orthopedic Surgery

## 2021-06-09 ENCOUNTER — Ambulatory Visit (HOSPITAL_BASED_OUTPATIENT_CLINIC_OR_DEPARTMENT_OTHER): Payer: PPO | Admitting: Certified Registered"

## 2021-06-09 ENCOUNTER — Ambulatory Visit (HOSPITAL_BASED_OUTPATIENT_CLINIC_OR_DEPARTMENT_OTHER)
Admission: RE | Admit: 2021-06-09 | Discharge: 2021-06-09 | Disposition: A | Discharge status: Home / Self Care | Payer: PPO | Attending: Orthopedic Surgery | Admitting: Orthopedic Surgery

## 2021-06-09 ENCOUNTER — Encounter (HOSPITAL_BASED_OUTPATIENT_CLINIC_OR_DEPARTMENT_OTHER): Payer: Self-pay | Admitting: Orthopedic Surgery

## 2021-06-09 DIAGNOSIS — Z886 Allergy status to analgesic agent status: Secondary | ICD-10-CM | POA: Diagnosis not present

## 2021-06-09 DIAGNOSIS — E785 Hyperlipidemia, unspecified: Secondary | ICD-10-CM | POA: Diagnosis not present

## 2021-06-09 DIAGNOSIS — Z888 Allergy status to other drugs, medicaments and biological substances status: Secondary | ICD-10-CM | POA: Insufficient documentation

## 2021-06-09 DIAGNOSIS — M722 Plantar fascial fibromatosis: Secondary | ICD-10-CM | POA: Insufficient documentation

## 2021-06-09 DIAGNOSIS — Z87891 Personal history of nicotine dependence: Secondary | ICD-10-CM | POA: Diagnosis not present

## 2021-06-09 DIAGNOSIS — Z79899 Other long term (current) drug therapy: Secondary | ICD-10-CM | POA: Diagnosis not present

## 2021-06-09 DIAGNOSIS — M6702 Short Achilles tendon (acquired), left ankle: Secondary | ICD-10-CM | POA: Diagnosis not present

## 2021-06-09 DIAGNOSIS — G8918 Other acute postprocedural pain: Secondary | ICD-10-CM | POA: Diagnosis not present

## 2021-06-09 HISTORY — PX: GASTROCNEMIUS RECESSION: SHX863

## 2021-06-09 HISTORY — PX: PLANTAR FASCIA RELEASE: SHX2239

## 2021-06-09 SURGERY — RECESSION, MUSCLE, GASTROCNEMIUS
Anesthesia: Regional | Site: Leg Lower | Laterality: Left

## 2021-06-09 MED ORDER — DEXAMETHASONE SODIUM PHOSPHATE 10 MG/ML IJ SOLN
INTRAMUSCULAR | Status: DC | PRN
  Administered 2021-06-09: 10 mg
  Administered 2021-06-09: 4 mg

## 2021-06-09 MED ORDER — ASPIRIN EC 81 MG PO TBEC: 81.0000 mg | DELAYED_RELEASE_TABLET | Freq: Two times a day (BID) | ORAL | 0 refills | Status: DC

## 2021-06-09 MED ORDER — OXYCODONE HCL 5 MG PO TABS: 5.0000 mg | ORAL_TABLET | Freq: Once | ORAL | Status: DC | PRN

## 2021-06-09 MED ORDER — DOCUSATE SODIUM 100 MG PO CAPS: 100.0000 mg | ORAL_CAPSULE | Freq: Two times a day (BID) | ORAL | 0 refills | Status: DC

## 2021-06-09 MED ORDER — CEFAZOLIN SODIUM-DEXTROSE 2-4 GM/100ML-% IV SOLN
2.0000 g | INTRAVENOUS | Status: AC
  Administered 2021-06-09: 2 g via INTRAVENOUS

## 2021-06-09 MED ORDER — MIDAZOLAM HCL 2 MG/2ML IJ SOLN
2.0000 mg | Freq: Once | INTRAMUSCULAR | Status: AC
  Administered 2021-06-09: 2 mg via INTRAVENOUS

## 2021-06-09 MED ORDER — OXYCODONE HCL 5 MG PO TABS: 5.0000 mg | ORAL_TABLET | ORAL | 0 refills | Status: AC | PRN

## 2021-06-09 MED ORDER — 0.9 % SODIUM CHLORIDE (POUR BTL) OPTIME
TOPICAL | Status: DC | PRN
  Administered 2021-06-09: 200 mL

## 2021-06-09 MED ORDER — ACETAMINOPHEN 500 MG PO TABS
1000.0000 mg | ORAL_TABLET | Freq: Once | ORAL | Status: AC
  Administered 2021-06-09: 1000 mg via ORAL

## 2021-06-09 MED ORDER — ONDANSETRON HCL 4 MG/2ML IJ SOLN
INTRAMUSCULAR | Status: DC | PRN
  Administered 2021-06-09: 4 mg via INTRAVENOUS

## 2021-06-09 MED ORDER — PROPOFOL 10 MG/ML IV BOLUS
INTRAVENOUS | Status: DC | PRN
  Administered 2021-06-09: 200 mg via INTRAVENOUS

## 2021-06-09 MED ORDER — OXYCODONE HCL 5 MG/5ML PO SOLN: 5.0000 mg | Freq: Once | ORAL | Status: DC | PRN

## 2021-06-09 MED ORDER — SENNA 8.6 MG PO TABS: 2.0000 | ORAL_TABLET | Freq: Two times a day (BID) | ORAL | 0 refills | Status: DC

## 2021-06-09 MED ORDER — PROMETHAZINE HCL 25 MG/ML IJ SOLN: 6.2500 mg | INTRAMUSCULAR | Status: DC | PRN

## 2021-06-09 MED ORDER — FENTANYL CITRATE (PF) 100 MCG/2ML IJ SOLN
100.0000 ug | Freq: Once | INTRAMUSCULAR | Status: AC
  Administered 2021-06-09: 100 ug via INTRAVENOUS

## 2021-06-09 MED ORDER — BUPIVACAINE HCL (PF) 0.5 % IJ SOLN
INTRAMUSCULAR | Status: DC | PRN
  Administered 2021-06-09: 40 mL via PERINEURAL

## 2021-06-09 MED ORDER — SODIUM CHLORIDE 0.9 % IV SOLN: INTRAVENOUS | Status: DC

## 2021-06-09 MED ORDER — MIDAZOLAM HCL 2 MG/2ML IJ SOLN
INTRAMUSCULAR | Status: AC
  Filled 2021-06-09: qty 2

## 2021-06-09 MED ORDER — LIDOCAINE 2% (20 MG/ML) 5 ML SYRINGE
INTRAMUSCULAR | Status: DC | PRN
  Administered 2021-06-09: 30 mg via INTRAVENOUS

## 2021-06-09 MED ORDER — FENTANYL CITRATE (PF) 100 MCG/2ML IJ SOLN
INTRAMUSCULAR | Status: AC
  Filled 2021-06-09: qty 2

## 2021-06-09 MED ORDER — MEPERIDINE HCL 25 MG/ML IJ SOLN: 6.2500 mg | INTRAMUSCULAR | Status: DC | PRN

## 2021-06-09 MED ORDER — CEFAZOLIN SODIUM-DEXTROSE 2-4 GM/100ML-% IV SOLN
INTRAVENOUS | Status: AC
  Filled 2021-06-09: qty 100

## 2021-06-09 MED ORDER — VANCOMYCIN HCL 500 MG IV SOLR
INTRAVENOUS | Status: AC
  Filled 2021-06-09: qty 500

## 2021-06-09 MED ORDER — VANCOMYCIN HCL 500 MG IV SOLR
INTRAVENOUS | Status: DC | PRN
  Administered 2021-06-09: 500 mg via TOPICAL

## 2021-06-09 MED ORDER — LACTATED RINGERS IV SOLN: INTRAVENOUS | Status: DC

## 2021-06-09 MED ORDER — HYDROMORPHONE HCL 1 MG/ML IJ SOLN: 0.2500 mg | INTRAMUSCULAR | Status: DC | PRN

## 2021-06-09 MED ORDER — ACETAMINOPHEN 500 MG PO TABS
ORAL_TABLET | ORAL | Status: AC
  Filled 2021-06-09: qty 2

## 2021-06-09 SURGICAL SUPPLY — 57 items
BANDAGE ESMARK 6X9 LF (GAUZE/BANDAGES/DRESSINGS) IMPLANT
BLADE SURG 15 STRL LF DISP TIS (BLADE) ×4 IMPLANT
BLADE SURG 15 STRL SS (BLADE) ×6
BNDG COHESIVE 4X5 TAN STRL (GAUZE/BANDAGES/DRESSINGS) ×3 IMPLANT
BNDG COHESIVE 6X5 TAN ST LF (GAUZE/BANDAGES/DRESSINGS) ×3 IMPLANT
BNDG ELASTIC 4X5.8 VLCR STR LF (GAUZE/BANDAGES/DRESSINGS) IMPLANT
BNDG ESMARK 6X9 LF (GAUZE/BANDAGES/DRESSINGS)
BOOT STEPPER DURA LG (SOFTGOODS) IMPLANT
BOOT STEPPER DURA MED (SOFTGOODS) IMPLANT
BOOT STEPPER DURA XLG (SOFTGOODS) IMPLANT
CHLORAPREP W/TINT 26 (MISCELLANEOUS) ×3 IMPLANT
COVER BACK TABLE 60X90IN (DRAPES) ×3 IMPLANT
CUFF TOURN SGL QUICK 34 (TOURNIQUET CUFF) ×3
CUFF TRNQT CYL 34X4.125X (TOURNIQUET CUFF) ×2 IMPLANT
DRAPE EXTREMITY T 121X128X90 (DISPOSABLE) ×3 IMPLANT
DRAPE U-SHAPE 47X51 STRL (DRAPES) ×3 IMPLANT
DRSG MEPITEL 4X7.2 (GAUZE/BANDAGES/DRESSINGS) ×3 IMPLANT
DRSG PAD ABDOMINAL 8X10 ST (GAUZE/BANDAGES/DRESSINGS) ×3 IMPLANT
ELECT REM PT RETURN 9FT ADLT (ELECTROSURGICAL) ×3
ELECTRODE REM PT RTRN 9FT ADLT (ELECTROSURGICAL) ×2 IMPLANT
GAUZE SPONGE 4X4 12PLY STRL (GAUZE/BANDAGES/DRESSINGS) ×3 IMPLANT
GLOVE SRG 8 PF TXTR STRL LF DI (GLOVE) ×4 IMPLANT
GLOVE SURG ENC MOIS LTX SZ8 (GLOVE) ×3 IMPLANT
GLOVE SURG LTX SZ8 (GLOVE) ×3 IMPLANT
GLOVE SURG POLYISO LF SZ7 (GLOVE) ×3 IMPLANT
GLOVE SURG UNDER POLY LF SZ7 (GLOVE) ×6 IMPLANT
GLOVE SURG UNDER POLY LF SZ8 (GLOVE) ×6
GOWN STRL REUS W/ TWL LRG LVL3 (GOWN DISPOSABLE) ×2 IMPLANT
GOWN STRL REUS W/ TWL XL LVL3 (GOWN DISPOSABLE) ×4 IMPLANT
GOWN STRL REUS W/TWL LRG LVL3 (GOWN DISPOSABLE) ×3
GOWN STRL REUS W/TWL XL LVL3 (GOWN DISPOSABLE) ×6
NEEDLE HYPO 22GX1.5 SAFETY (NEEDLE) IMPLANT
NEEDLE HYPO 25X1 1.5 SAFETY (NEEDLE) IMPLANT
NS IRRIG 1000ML POUR BTL (IV SOLUTION) ×3 IMPLANT
PACK BASIN DAY SURGERY FS (CUSTOM PROCEDURE TRAY) ×3 IMPLANT
PAD CAST 4YDX4 CTTN HI CHSV (CAST SUPPLIES) ×2 IMPLANT
PADDING CAST COTTON 4X4 STRL (CAST SUPPLIES) ×3
PADDING CAST COTTON 6X4 STRL (CAST SUPPLIES) IMPLANT
PENCIL SMOKE EVACUATOR (MISCELLANEOUS) ×3 IMPLANT
SANITIZER HAND PURELL 535ML FO (MISCELLANEOUS) ×3 IMPLANT
SHEET MEDIUM DRAPE 40X70 STRL (DRAPES) ×3 IMPLANT
SPLINT FAST PLASTER 5X30 (CAST SUPPLIES)
SPLINT PLASTER CAST FAST 5X30 (CAST SUPPLIES) IMPLANT
SPONGE T-LAP 18X18 ~~LOC~~+RFID (SPONGE) ×3 IMPLANT
STOCKINETTE 6  STRL (DRAPES) ×3
STOCKINETTE 6 STRL (DRAPES) ×2 IMPLANT
SUCTION FRAZIER HANDLE 10FR (MISCELLANEOUS) ×3
SUCTION TUBE FRAZIER 10FR DISP (MISCELLANEOUS) ×2 IMPLANT
SUT ETHILON 3 0 PS 1 (SUTURE) ×3 IMPLANT
SUT MNCRL AB 3-0 PS2 18 (SUTURE) ×3 IMPLANT
SUT VIC AB 0 SH 27 (SUTURE) IMPLANT
SYR BULB EAR ULCER 3OZ GRN STR (SYRINGE) ×3 IMPLANT
SYR CONTROL 10ML LL (SYRINGE) IMPLANT
TOWEL GREEN STERILE FF (TOWEL DISPOSABLE) ×3 IMPLANT
TUBE CONNECTING 20X1/4 (TUBING) ×3 IMPLANT
UNDERPAD 30X36 HEAVY ABSORB (UNDERPADS AND DIAPERS) ×3 IMPLANT
YANKAUER SUCT BULB TIP NO VENT (SUCTIONS) IMPLANT

## 2021-06-09 NOTE — Progress Notes (Signed)
Assisted Dr. Doroteo Glassman with left, ultrasound guided, popliteal/saphenous, adductor canal block. Side rails up, monitors on throughout procedure. See vital signs in flow sheet. Tolerated Procedure well.

## 2021-06-09 NOTE — H&P (Signed)
Joshua Dominguez is an 80 y.o. male.   Chief Complaint: Left heel pain HPI: 80 year old male with a multiyear history of left heel pain.  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  He has failed nonoperative treatment to date including activity modification, shoewear modification, physical therapy, oral anti-inflammatories and steroid injection.  He presents now for surgical treatment of this painful and limiting condition.  Past Medical History:  Diagnosis Date   Allergic rhinitis    Bilateral hydrocele    DDD (degenerative disc disease), lumbar     L3-L4   ED (erectile dysfunction) of organic origin    Gout    Hemorrhoids    History of prostatitis    Lumbar herniated disc 2007   L4 and L5 did not require surgery   Prostate cancer ([place])    S/P  RADIOACTIVE SEED IMPLANTS 05-09-2013    Past Surgical History:  Procedure Laterality Date   APPENDECTOMY  FEB 2000   CATARACT EXTRACTION W/ INTRAOCULAR LENS  IMPLANT, BILATERAL     HYDROCELE EXCISION Bilateral 01/02/2014   Procedure: BILATERAL HYDROCELECTOMY;  Surgeon: Claybon Jabs, MD;  Location: [facility];  Service: Urology;  Laterality: Bilateral;   HYDROCELE EXCISION Left 04/17/2014   Procedure: LEFT HYDROCELECTOMY ADULT;  Surgeon: Claybon Jabs, MD;  Location: [facility];  Service: Urology;  Laterality: Left;   KNEE ARTHROSCOPY Left    RADIOACTIVE SEED IMPLANT N/A 05/09/2013   Procedure: RADIOACTIVE SEED IMPLANT;  Surgeon: Claybon Jabs, MD;  Location: [facility];  Service: Urology;  Laterality: N/A;    Family History  Problem Relation Age of Onset   Hypertension Father    Gout Father    Prostate cancer Father    Lung cancer Mother        +smoker   Lung cancer Maternal Grandfather        -smoker   Heart disease Sister    Heart disease Brother    Social History:  reports that he quit smoking about 44 years ago. His smoking use included pipe. He has never used  smokeless tobacco. He reports current alcohol use of about 14.0 standard drinks of alcohol per week. He reports that he does not use drugs.  Allergies:  Allergies  Allergen Reactions   Celebrex [Celecoxib] Rash   Oxymetazoline Hcl Rash    Medications Prior to Admission  Medication Sig Dispense Refill   amitriptyline (ELAVIL) 50 MG tablet TAKE 1 TABLET BY MOUTH DAILY AT BEDTIME 90 tablet 3   cephALEXin (KEFLEX) 250 MG capsule Take by mouth 4 (four) times daily.     Febuxostat 80 MG TABS TAKE 1 TABLET(80 MG) BY MOUTH DAILY 90 tablet 1   gabapentin (NEURONTIN) 300 MG capsule TAKE 3 CAPSULES BY MOUTH EVERY MORNING AND 2 AT BEDTIME AS DIRECTED 450 capsule 2   glucosamine-chondroitin 500-400 MG tablet Take 1 tablet by mouth 2 (two) times daily.     Multiple Vitamin (MULTIVITAMIN) tablet Take 1 tablet by mouth daily.     Omega-3 Fatty Acids (FISH OIL) 1200 MG CAPS Take 1 capsule by mouth 2 (two) times daily.     colchicine 0.6 MG tablet Take 1 tablet (0.6 mg total) by mouth as needed. Take two by mouth now then one by mouth in one hour if no better. 6 tablet 1    No results found for this or any previous visit (from the past 48 hour(s)). No results found.  Review of  Systems no recent fever, chills, nausea, vomiting or changes in his appetite  Blood pressure 125/80, pulse (!) 56, temperature 97.7 F (36.5 C), temperature source Oral, resp. rate 18, height '6\' 5"'$  (1.956 m), weight 122.4 kg, SpO2 95 %. Physical Exam  Well-nourished well-developed man in no apparent distress.  Alert and oriented x4.  Normal mood and affect.  Gait is antalgic to the left.  Left heel is tender to palpation.  Heel cord is tight.  5 out of 5 strength in plantarflexion and dorsiflexion of the ankle and toes.  No lymphadenopathy.  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  The risks and benefits of the  alternative treatment options have been discussed in detail.  The patient wishes to proceed with surgery and specifically understands risks of bleeding, infection, nerve damage, blood clots, need for additional surgery, amputation and death.   Wylene Simmer, MD 06-15-21, 8:35 AM

## 2021-06-09 NOTE — Anesthesia Procedure Notes (Signed)
Procedure Name: LMA Insertion Date/Time: 06/09/2021 8:54 AM Performed by: Signe Colt, CRNA Pre-anesthesia Checklist: Patient identified, Emergency Drugs available, Suction available and Patient being monitored Patient Re-evaluated:Patient Re-evaluated prior to induction Oxygen Delivery Method: Circle System Utilized Preoxygenation: Pre-oxygenation with 100% oxygen Induction Type: IV induction Ventilation: Mask ventilation without difficulty LMA: LMA inserted LMA Size: 4.0 Number of attempts: 1 Airway Equipment and Method: bite block Placement Confirmation: positive ETCO2 Tube secured with: Tape Dental Injury: Teeth and Oropharynx as per pre-operative assessment

## 2021-06-09 NOTE — Anesthesia Postprocedure Evaluation (Signed)
Anesthesia Post Note  Patient: Joshua Dominguez  Procedure(s) Performed: Left gastroc recession (Left: Leg Lower) Plantar fascia release (Left: Foot)     Patient location during evaluation: PACU Anesthesia Type: Regional and General Level of consciousness: awake and alert, oriented and patient cooperative Pain management: pain level controlled Vital Signs Assessment: post-procedure vital signs reviewed and stable Respiratory status: spontaneous breathing, nonlabored ventilation and respiratory function stable Cardiovascular status: blood pressure returned to baseline and stable Postop Assessment: no apparent nausea or vomiting Anesthetic complications: no   No notable events documented.  Last Vitals:  Vitals:   06/09/21 1000 06/09/21 1015  BP: 118/74 117/76  Pulse: 66 62  Resp: 11 15  Temp:    SpO2: 98% 99%    Last Pain:  Vitals:   06/09/21 1015  TempSrc:   PainSc: 0-No pain                 Pervis Hocking

## 2021-06-09 NOTE — Anesthesia Procedure Notes (Signed)
Anesthesia Regional Block: Popliteal block   Pre-Anesthetic Checklist: , timeout performed,  Correct Patient, Correct Site, Correct Laterality,  Correct Procedure, Correct Position, site marked,  Risks and benefits discussed,  Surgical consent,  Pre-op evaluation,  At surgeon's request and post-op pain management  Laterality: Left  Prep: Maximum Sterile Barrier Precautions used, chloraprep       Needles:  Injection technique: Single-shot  Needle Type: Echogenic Stimulator Needle     Needle Length: 9cm  Needle Gauge: 22     Additional Needles:   Procedures:,,,, ultrasound used (permanent image in chart),,    Narrative:  Start time: 06/09/2021 8:10 AM End time: 06/09/2021 8:15 AM Injection made incrementally with aspirations every 5 mL.  Performed by: Personally  Anesthesiologist: Pervis Hocking, DO  Additional Notes: Monitors applied. No increased pain on injection. No increased resistance to injection. Injection made in 5cc increments. Good needle visualization. Patient tolerated procedure well.

## 2021-06-09 NOTE — Discharge Instructions (Addendum)
No Tylenol until 1:15 today if needed.    Post Anesthesia Home Care Instructions  Activity: Get plenty of rest for the remainder of the day. A responsible individual must stay with you for 24 hours following the procedure.  For the next 24 hours, DO NOT: -Drive a car -Paediatric nurse -Drink alcoholic beverages -Take any medication unless instructed by your physician -Make any legal decisions or sign important papers.  Meals: Start with liquid foods such as gelatin or soup. Progress to regular foods as tolerated. Avoid greasy, spicy, heavy foods. If nausea and/or vomiting occur, drink only clear liquids until the nausea and/or vomiting subsides. Call your physician if vomiting continues.  Special Instructions/Symptoms: Your throat may feel dry or sore from the anesthesia or the breathing tube placed in your throat during surgery. If this causes discomfort, gargle with warm salt water. The discomfort should disappear within 24 hours.  If you had a scopolamine patch placed behind your ear for the management of post- operative nausea and/or vomiting:  1. The medication in the patch is effective for 72 hours, after which it should be removed.  Wrap patch in a tissue and discard in the trash. Wash hands thoroughly with soap and water. 2. You may remove the patch earlier than 72 hours if you experience unpleasant side effects which may include dry mouth, dizziness or visual disturbances. 3. Avoid touching the patch. Wash your hands with soap and water after contact with the patch. Regional Anesthesia Blocks  1. Numbness or the inability to move the "blocked" extremity may last from 3-48 hours after placement. The length of time depends on the medication injected and your individual response to the medication. If the numbness is not going away after 48 hours, call your surgeon.  2. The extremity that is blocked will need to be protected until the numbness is gone and the  Strength has  returned. Because you cannot feel it, you will need to take extra care to avoid injury. Because it may be weak, you may have difficulty moving it or using it. You may not know what position it is in without looking at it while the block is in effect.  3. For blocks in the legs and feet, returning to weight bearing and walking needs to be done carefully. You will need to wait until the numbness is entirely gone and the strength has returned. You should be able to move your leg and foot normally before you try and bear weight or walk. You will need someone to be with you when you first try to ensure you do not fall and possibly risk injury.  4. Bruising and tenderness at the needle site are common side effects and will resolve in a few days.  5. Persistent numbness or new problems with movement should be communicated to the surgeon or the [place] [phone] [place] [phone]).       Wylene Simmer, MD [facility]  Please read the following information regarding your care after surgery.  Medications  You only need a prescription for the narcotic pain medicine (ex. oxycodone, Percocet, Norco).  All of the other medicines listed below are available over the counter. X Aleve 2 pills twice a day for the first 3 days after surgery. X acetominophen (Tylenol) 650 mg every 4-6 hours as you need for minor to moderate pain X oxycodone as prescribed for severe pain  Narcotic pain medicine (ex. oxycodone, Percocet, Vicodin) will cause constipation.  To prevent  this problem, take the following medicines while you are taking any pain medicine. X docusate sodium (Colace) 100 mg twice a day X senna (Senokot) 2 tablets twice a day  X To help prevent blood clots, take a baby aspirin (81 mg) twice a day for two weeks after surgery.  You should also get up every hour while you are awake to move around.    Weight Bearing X Do not bear any weight on the operated leg or  foot.  Cast / Splint / Dressing X Keep your splint, cast or dressing clean and dry.  Don't put anything (coat hanger, pencil, etc) down inside of it.  If it gets damp, use a hair dryer on the cool setting to dry it.  If it gets soaked, call the office to schedule an appointment for a cast change.   After your dressing, cast or splint is removed; you may shower, but do not soak or scrub the wound.  Allow the water to run over it, and then gently pat it dry.  Swelling It is normal for you to have swelling where you had surgery.  To reduce swelling and pain, keep your toes above your nose for at least 3 days after surgery.  It may be necessary to keep your foot or leg elevated for several weeks.  If it hurts, it should be elevated.  Follow Up Call my office at [phone] when you are discharged from the hospital or surgery center to schedule an appointment to be seen two weeks after surgery.  Call my office at [phone] if you develop a fever >101.5 F, nausea, vomiting, bleeding from the surgical site or severe pain.

## 2021-06-09 NOTE — Op Note (Signed)
06/09/2021  9:44 AM  PATIENT:  Joshua Dominguez  80 y.o. male  PRE-OPERATIVE DIAGNOSIS: 1.  Left chronic plantar fasciitis 2.  Short left Achilles tendon   POST-OPERATIVE DIAGNOSIS: Same  Procedure(s): 1.  Left gastroc recession 2.  Left plantar fascia release  SURGEON:  Wylene Simmer, MD  ASSISTANT: Mechele Claude, PA-C  ANESTHESIA:   General, regional  EBL:  minimal   TOURNIQUET:   Total Tourniquet Time Documented: Thigh (Left) - 20 minutes Total: Thigh (Left) - 20 minutes  COMPLICATIONS:  None apparent  DISPOSITION:  Extubated, awake and stable to recovery.  INDICATION FOR PROCEDURE: The patient is a 80 year old male without significant past medical history.  He has a long history of left plantar heel pain from chronic plantar fasciitis.  He also has a tight heel cord.  He has failed nonoperative treatment and presents today for surgery.  The risks and benefits of the alternative treatment options have been discussed in detail.  The patient wishes to proceed with surgery and specifically understands risks of bleeding, infection, nerve damage, blood clots, need for additional surgery, amputation and death.   PROCEDURE IN DETAIL:  After pre operative consent was obtained, and the correct operative site was identified, the patient was brought to the operating room and placed supine on the OR table.  Anesthesia was administered.  Pre-operative antibiotics were administered.  A surgical timeout was taken.  The left lower extremity was prepped and draped in standard sterile fashion with a tourniquet around the thigh.  The extremity was elevated and the tourniquet was inflated to 250 mmHg.  A longitudinal incision was made over the medial calf.  Dissection was carried sharply down through the subcutaneous tissues taking care to protect the saphenous nerve and vein.  Superficial fascia was incised.  The gastrocnemius tendon was identified.  The plantaris tendon was also identified and was  transected under direct vision.  The sural nerve was protected.  The gastrocnemius tendon was divided from medial to lateral under direct vision.  The ankle would then dorsiflex 20 degrees with the knee extended.  The wound was irrigated and sprinkled with vancomycin powder.  Subcutaneous tissues were approximated with Monocryl.  The skin incision was closed with nylon.  Attention was turned to the medial hindfoot.  The medial cord of the plantar fascial was identified.  An incision was made at the proximal end of the arch.  Dissection was carried sharply down through the subcutaneous tissues.  Dissection was carried superficial to the plantar fascia.  The medial cord was then divided under direct vision.  The plantar fascial release was carried about 50% of the width of the plantar fascia from medial to the midline.  The wound was irrigated copiously and sprinkled with vancomycin powder.  Skin incision was closed with horizontal mattress sutures of 3-0 nylon.  Sterile dressings were applied followed by a well-padded short leg splint.  The tourniquet was released after application of the dressings.  The patient was awakened from anesthesia and transported to the recovery room in stable condition.   FOLLOW UP PLAN: Nonweightbearing on the left lower extremity for the next 2 weeks.  Follow-up in the office for suture removal and conversion to a cam boot for another month.  Aspirin for DVT prophylaxis.     Mechele Claude PA-C was present and scrubbed for the duration of the operative case. His assistance was essential in positioning the patient, prepping and draping, gaining and maintaining exposure, performing the operation, closing and  dressing the wounds and applying the splint.

## 2021-06-09 NOTE — Anesthesia Preprocedure Evaluation (Addendum)
Anesthesia Evaluation  Patient identified by MRN, date of birth, ID band Patient awake    Reviewed: Allergy & Precautions, NPO status , Patient's Chart, lab work & pertinent test results  Airway Mallampati: III  TM Distance: >3 FB Neck ROM: Full    Dental no notable dental hx. (+) Teeth Intact, Dental Advisory Given   Pulmonary former smoker,  Quit smoking 1978   Pulmonary exam normal breath sounds clear to auscultation       Cardiovascular negative cardio ROS Normal cardiovascular exam Rhythm:Regular Rate:Normal     Neuro/Psych negative neurological ROS  negative psych ROS   GI/Hepatic negative GI ROS, (+)     substance abuse  alcohol use,   Endo/Other  negative endocrine ROS  Renal/GU negative Renal ROS  negative genitourinary   Musculoskeletal  (+) Arthritis , Osteoarthritis,  Acquired short achilles tendon, plantar fasciitis    Abdominal   Peds  Hematology negative hematology ROS (+)   Anesthesia Other Findings   Reproductive/Obstetrics                            Anesthesia Physical Anesthesia Plan  ASA: 2  Anesthesia Plan: General and Regional   Post-op Pain Management: GA combined w/ Regional for post-op pain   Induction: Intravenous  PONV Risk Score and Plan: Ondansetron, Dexamethasone and Treatment may vary due to age or medical condition  Airway Management Planned: LMA  Additional Equipment: None  Intra-op Plan:   Post-operative Plan: Extubation in OR  Informed Consent: I have reviewed the patients History and Physical, chart, labs and discussed the procedure including the risks, benefits and alternatives for the proposed anesthesia with the patient or authorized representative who has indicated his/her understanding and acceptance.     Dental advisory given  Plan Discussed with: CRNA  Anesthesia Plan Comments:        Anesthesia Quick Evaluation

## 2021-06-09 NOTE — Transfer of Care (Signed)
Immediate Anesthesia Transfer of Care Note  Patient: Janit Pagan  Procedure(s) Performed: Left gastroc recession (Left: Leg Lower) Plantar fascia release (Left: Foot)  Patient Location: PACU  Anesthesia Type:MAC combined with regional for post-op pain  Level of Consciousness: drowsy and patient cooperative  Airway & Oxygen Therapy: Patient Spontanous Breathing and Patient connected to face mask oxygen  Post-op Assessment: Report given to RN and Post -op Vital signs reviewed and stable  Post vital signs: Reviewed and stable  Last Vitals:  Vitals Value Taken Time  BP    Temp    Pulse 66 06/09/21 0935  Resp 13 06/09/21 0935  SpO2 95 % 06/09/21 0935  Vitals shown include unvalidated device data.  Last Pain:  Vitals:   06/09/21 0704  TempSrc: Oral  PainSc: 6       Patients Stated Pain Goal: 3 (20/35/59 7416)  Complications: No notable events documented.

## 2021-06-09 NOTE — Anesthesia Procedure Notes (Signed)
Anesthesia Regional Block: Adductor canal block   Pre-Anesthetic Checklist: , timeout performed,  Correct Patient, Correct Site, Correct Laterality,  Correct Procedure, Correct Position, site marked,  Risks and benefits discussed,  Surgical consent,  Pre-op evaluation,  At surgeon's request and post-op pain management  Laterality: Left  Prep: Maximum Sterile Barrier Precautions used, chloraprep       Needles:  Injection technique: Single-shot  Needle Type: Echogenic Stimulator Needle     Needle Length: 9cm  Needle Gauge: 22     Additional Needles:   Procedures:,,,, ultrasound used (permanent image in chart),,    Narrative:  Start time: 06/09/2021 8:15 AM End time: 06/09/2021 8:20 AM Injection made incrementally with aspirations every 5 mL.  Performed by: Personally  Anesthesiologist: Pervis Hocking, DO  Additional Notes: Monitors applied. No increased pain on injection. No increased resistance to injection. Injection made in 5cc increments. Good needle visualization. Patient tolerated procedure well.

## 2021-06-10 ENCOUNTER — Encounter (HOSPITAL_BASED_OUTPATIENT_CLINIC_OR_DEPARTMENT_OTHER): Payer: Self-pay | Admitting: Orthopedic Surgery

## 2021-06-22 DIAGNOSIS — Z4889 Encounter for other specified surgical aftercare: Secondary | ICD-10-CM | POA: Diagnosis not present

## 2021-06-22 DIAGNOSIS — M6702 Short Achilles tendon (acquired), left ankle: Secondary | ICD-10-CM | POA: Diagnosis not present

## 2021-06-22 DIAGNOSIS — M722 Plantar fascial fibromatosis: Secondary | ICD-10-CM | POA: Diagnosis not present

## 2021-06-30 ENCOUNTER — Encounter: Payer: Self-pay | Admitting: Adult Health

## 2021-07-25 ENCOUNTER — Encounter: Payer: PPO | Admitting: Adult Health

## 2021-07-26 ENCOUNTER — Encounter: Payer: PPO | Admitting: Family

## 2021-08-24 ENCOUNTER — Ambulatory Visit (INDEPENDENT_AMBULATORY_CARE_PROVIDER_SITE_OTHER): Payer: PPO | Admitting: Family

## 2021-08-24 ENCOUNTER — Encounter: Payer: Self-pay | Admitting: Family

## 2021-08-24 ENCOUNTER — Other Ambulatory Visit: Payer: Self-pay

## 2021-08-24 DIAGNOSIS — G629 Polyneuropathy, unspecified: Secondary | ICD-10-CM | POA: Insufficient documentation

## 2021-08-24 DIAGNOSIS — Z0000 Encounter for general adult medical examination without abnormal findings: Secondary | ICD-10-CM

## 2021-08-24 DIAGNOSIS — G6289 Other specified polyneuropathies: Secondary | ICD-10-CM

## 2021-08-24 NOTE — Progress Notes (Signed)
Subjective:    Patient ID: Joshua Dominguez, male    DOB: [birth year], 80 y.o.   MRN: [id number]  CC: FERNANDO STOIBER is a 80 y.o. male who presents today for physical exam.    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  Joshua Dominguez reports seeing neurology in the past for this and who originally prescribed gabapentin.  Joshua Dominguez is bothered by the neuropathy as it affects his quality of life. compliant with amitriptyline 50mg  qhs, gabapentin 300mg  takes 2 capsules by mouth every morning and 4 at bedtime.Gabapentin is not too sedating.  Denies back pain or numbness of back, legs  No h/o DM, b12 deficiency  History of gout, prostate cancer status post radioactive seed implant  Colorectal  Cancer Screening: done 03/2009 and recommended to be repeated 2020.  No repeat colonoscopy. Declines having colon cancer screening.  Prostate Cancer Screening: History of prostate cancer.  PSA 1 year ago was undetectable  Lung Cancer Screening: No 30 year pack year history and > 50 years to 79 years. Quit 1978   Immunizations       Tetanus - utd Hepatitis C screening - Candidate for, declines  Labs: Screening labs today. Exercise: Gets regular exercise with running.  Alcohol use:  occasional Smoking/tobacco use: former smoker.     HISTORY:  Past Medical History:  Diagnosis Date   Allergic rhinitis    Bilateral hydrocele    DDD (degenerative disc disease), lumbar     L3-L4   ED (erectile dysfunction) of organic origin    Gout    Hemorrhoids    History of prostatitis    Lumbar herniated disc 2007   L4 and L5 did not require surgery   Prostate cancer ([place])    S/P  RADIOACTIVE SEED IMPLANTS 05-09-2013    Past Surgical History:  Procedure Laterality Date   APPENDECTOMY  FEB 2000   CATARACT EXTRACTION W/ INTRAOCULAR LENS  IMPLANT, BILATERAL     GASTROCNEMIUS RECESSION Left 06/09/2021   Procedure: Left gastroc recession;  Surgeon: Wylene Simmer,  MD;  Location: [place];  Service: Orthopedics;  Laterality: Left;   HYDROCELE EXCISION Bilateral 01/02/2014   Procedure: BILATERAL HYDROCELECTOMY;  Surgeon: Claybon Jabs, MD;  Location: [facility];  Service: Urology;  Laterality: Bilateral;   HYDROCELE EXCISION Left 04/17/2014   Procedure: LEFT HYDROCELECTOMY ADULT;  Surgeon: Claybon Jabs, MD;  Location: [facility];  Service: Urology;  Laterality: Left;   KNEE ARTHROSCOPY Left    PLANTAR FASCIA RELEASE Left 06/09/2021   Procedure: Plantar fascia release;  Surgeon: Wylene Simmer, MD;  Location: [place];  Service: Orthopedics;  Laterality: Left;   RADIOACTIVE SEED IMPLANT N/A 05/09/2013   Procedure: RADIOACTIVE SEED IMPLANT;  Surgeon: Claybon Jabs, MD;  Location[facility];  Service: Urology;  Laterality: N/A;   Family History  Problem Relation Age of Onset   Hypertension Father    Gout Father    Prostate cancer Father    Lung cancer Mother        +smoker   Lung cancer Maternal Grandfather        -smoker   Heart disease Sister    Heart disease Brother       ALLERGIES: Celebrex [celecoxib] and Oxymetazoline hcl  Current Outpatient Medications on File Prior to Visit  Medication Sig Dispense Refill   amitriptyline (ELAVIL) 50 MG tablet TAKE 1 TABLET BY  MOUTH DAILY AT BEDTIME 90 tablet 3   colchicine 0.6 MG tablet Take 1 tablet (0.6 mg total) by mouth as needed. Take two by mouth now then one by mouth in one hour if no better. 6 tablet 1   Febuxostat 80 MG TABS TAKE 1 TABLET(80 MG) BY MOUTH DAILY 90 tablet 1   gabapentin (NEURONTIN) 300 MG capsule TAKE 3 CAPSULES BY MOUTH EVERY MORNING AND 2 AT BEDTIME AS DIRECTED (Patient taking differently: Take 300 mg by mouth. TAKE 2 CAPSULES BY MOUTH EVERY MORNING AND 4 AT BEDTIME AS DIRECTED) 450 capsule 2   glucosamine-chondroitin 500-400 MG tablet Take 1 tablet by mouth 2 (two) times daily.     Multiple Vitamin  (MULTIVITAMIN) tablet Take 1 tablet by mouth daily.     Omega-3 Fatty Acids (FISH OIL) 1200 MG CAPS Take 1 capsule by mouth 2 (two) times daily.     No current facility-administered medications on file prior to visit.    Social History   Tobacco Use   Smoking status: Former    Types: Pipe    Quit date: 11/13/1976    Years since quitting: 44.8   Smokeless tobacco: Never  Vaping Use   Vaping Use: Never used  Substance Use Topics   Alcohol use: Yes    Alcohol/week: 14.0 standard drinks    Types: 14 Glasses of wine per week   Drug use: No    Review of Systems  Constitutional:  Negative for chills and fever.  HENT:  Negative for congestion.   Respiratory:  Negative for cough.   Cardiovascular:  Negative for chest pain, palpitations and leg swelling.  Gastrointestinal:  Negative for diarrhea, nausea and vomiting.  Musculoskeletal:  Negative for back pain and myalgias.  Skin:  Negative for rash.  Neurological:  Positive for numbness. Negative for headaches.  Hematological:  Negative for adenopathy.  Psychiatric/Behavioral:  Negative for confusion.      Objective:    BP 124/82 (BP Location: Left Arm, Patient Position: Sitting, Cuff Size: Normal)   Pulse 69   Temp 98.1 F (36.7 C) (Oral)   Ht 6\' 5"  (1.956 m)   Wt 266 lb (120.7 kg)   SpO2 95%   BMI 31.54 kg/m   BP Readings from Last 3 Encounters:  08/24/21 124/82  06/09/21 129/82  05/31/21 (!) 167/93   Wt Readings from Last 3 Encounters:  08/24/21 266 lb (120.7 kg)  06/09/21 269 lb 13.5 oz (122.4 kg)  05/31/21 265 lb (120.2 kg)    Physical Exam Vitals reviewed.  Constitutional:      Appearance: Joshua Dominguez is well-developed.  Neck:     Thyroid: No thyroid mass or thyromegaly.  Cardiovascular:     Rate and Rhythm: Regular rhythm.     Heart sounds: Normal heart sounds.  Pulmonary:     Effort: Pulmonary effort is normal. No respiratory distress.     Breath sounds: Normal breath sounds. No wheezing, rhonchi or rales.   Feet:     Comments: Decreased sensation bilateral dorsal aspect of feet. Lymphadenopathy:     Head:     Right side of head: No submental, submandibular, tonsillar, preauricular, posterior auricular or occipital adenopathy.     Left side of head: No submental, submandibular, tonsillar, preauricular, posterior auricular or occipital adenopathy.     Cervical: No cervical adenopathy.  Skin:    General: Skin is warm and dry.  Neurological:     Mental Status: Joshua Dominguez is alert.  Psychiatric:  Speech: Speech normal.        Behavior: Behavior normal.       Assessment & Plan:   Problem List Items Addressed This Visit       Nervous and Auditory   Peripheral neuropathy    Uncontrolled. Continue amitriptyline 50 mg nightly, gabapentin 300 mg by taking 2 capsules in the morning and 4 at bedtime. May be a candidae for lyric at follow up. .  Consult with pain management, Dr Holley Raring, for discussion of procedures that may be available to Joshua Dominguez.       Relevant Orders   Ambulatory referral to Pain Clinic   B12 and Folate Panel     Other   Routine physical examination    Joshua Dominguez is no longer screening for colon cancer.  Screening labs been ordered.  Congratulated Joshua Dominguez on his vigorous exercise      Relevant Orders   CBC with Differential/Platelet   Comprehensive metabolic panel   Hemoglobin A1c   Hepatitis C antibody   Lipid panel   PSA   TSH   VITAMIN D 25 Hydroxy (Vit-D Deficiency, Fractures)     I have discontinued Gwyndolyn Saxon L. Sahm "Bill"'s cephALEXin, senna, docusate sodium, and aspirin EC. I am also having Joshua Dominguez maintain his Fish Oil, glucosamine-chondroitin, multivitamin, colchicine, amitriptyline, Febuxostat, and gabapentin.   No orders of the defined types were placed in this encounter.   Return precautions given.   Risks, benefits, and alternatives of the medications and treatment plan prescribed today were discussed, and patient expressed understanding.   Education regarding  symptom management and diagnosis given to patient on AVS.   Continue to follow with Elby Beck, FNP for routine health maintenance.   Joshua Dominguez and I agreed with plan.   Mable Paris, FNP

## 2021-08-24 NOTE — Patient Instructions (Signed)
Referral to pain management  Let us know if you dont hear back within a week in regards to an appointment being scheduled.    Health Maintenance, Male Adopting a healthy lifestyle and getting preventive care are important in promoting health and wellness. Ask your health care provider about: The right schedule for you to have regular tests and exams. Things you can do on your own to prevent diseases and keep yourself healthy. What should I know about diet, weight, and exercise? Eat a healthy diet  Eat a diet that includes plenty of vegetables, fruits, low-fat dairy products, and lean protein. Do not eat a lot of foods that are high in solid fats, added sugars, or sodium. Maintain a healthy weight Body mass index (BMI) is a measurement that can be used to identify possible weight problems. It estimates body fat based on height and weight. Your health care provider can help determine your BMI and help you achieve or maintain a healthy weight. Get regular exercise Get regular exercise. This is one of the most important things you can do for your health. Most adults should: Exercise for at least 150 minutes each week. The exercise should increase your heart rate and make you sweat (moderate-intensity exercise). Do strengthening exercises at least twice a week. This is in addition to the moderate-intensity exercise. Spend less time sitting. Even light physical activity can be beneficial. Watch cholesterol and blood lipids Have your blood tested for lipids and cholesterol at 78 years of age, then have this test every 5 years. You may need to have your cholesterol levels checked more often if: Your lipid or cholesterol levels are high. You are older than 78 years of age. You are at high risk for heart disease. What should I know about cancer screening? Many types of cancers can be detected early and may often be prevented. Depending on your health history and family history, you may need to have  cancer screening at various ages. This may include screening for: Colorectal cancer. Prostate cancer. Skin cancer. Lung cancer. What should I know about heart disease, diabetes, and high blood pressure? Blood pressure and heart disease High blood pressure causes heart disease and increases the risk of stroke. This is more likely to develop in people who have high blood pressure readings, are of African descent, or are overweight. Talk with your health care provider about your target blood pressure readings. Have your blood pressure checked: Every 3-5 years if you are 31-42 years of age. Every year if you are 17 years old or older. If you are between the ages of 67 and 74 and are a current or former smoker, ask your health care provider if you should have a one-time screening for abdominal aortic aneurysm (AAA). Diabetes Have regular diabetes screenings. This checks your fasting blood sugar level. Have the screening done: Once every three years after age 68 if you are at a normal weight and have a low risk for diabetes. More often and at a younger age if you are overweight or have a high risk for diabetes. What should I know about preventing infection? Hepatitis B If you have a higher risk for hepatitis B, you should be screened for this virus. Talk with your health care provider to find out if you are at risk for hepatitis B infection. Hepatitis C Blood testing is recommended for: Everyone born from [birth year] through 1963. Anyone with known risk factors for hepatitis C. Sexually transmitted infections (STIs) You should be screened each  year for STIs, including gonorrhea and chlamydia, if: You are sexually active and are younger than 78 years of age. You are older than 78 years of age and your health care provider tells you that you are at risk for this type of infection. Your sexual activity has changed since you were last screened, and you are at increased risk for chlamydia or gonorrhea. Ask  your health care provider if you are at risk. Ask your health care provider about whether you are at high risk for HIV. Your health care provider may recommend a prescription medicine to help prevent HIV infection. If you choose to take medicine to prevent HIV, you should first get tested for HIV. You should then be tested every 3 months for as long as you are taking the medicine. Follow these instructions at home: Lifestyle Do not use any products that contain nicotine or tobacco, such as cigarettes, e-cigarettes, and chewing tobacco. If you need help quitting, ask your health care provider. Do not use street drugs. Do not share needles. Ask your health care provider for help if you need support or information about quitting drugs. Alcohol use Do not drink alcohol if your health care provider tells you not to drink. If you drink alcohol: Limit how much you have to 0-2 drinks a day. Be aware of how much alcohol is in your drink. In the [place] one drink equals one 12 oz bottle of beer (355 mL), one 5 oz glass of wine (148 mL), or one 1 oz glass of hard liquor (44 mL). General instructions Schedule regular health, dental, and eye exams. Stay current with your vaccines. Tell your health care provider if: You often feel depressed. You have ever been abused or do not feel safe at home. Summary Adopting a healthy lifestyle and getting preventive care are important in promoting health and wellness. Follow your health care provider's instructions about healthy diet, exercising, and getting tested or screened for diseases. Follow your health care provider's instructions on monitoring your cholesterol and blood pressure. This information is not intended to replace advice given to you by your health care provider. Make sure you discuss any questions you have with your health care provider. Document Revised: 01/07/2021 Document Reviewed: 10/23/2018 Elsevier Patient Education  2022 [employer].

## 2021-08-24 NOTE — Assessment & Plan Note (Addendum)
Uncontrolled. Continue amitriptyline 50 mg nightly, gabapentin 300 mg by taking 2 capsules in the morning and 4 at bedtime. May be a candidae for lyric at follow up. .  Consult with pain management, Dr Holley Raring, for discussion of procedures that may be available to him.

## 2021-08-25 NOTE — Assessment & Plan Note (Signed)
He is no longer screening for colon cancer.  Screening labs been ordered.  Congratulated him on his vigorous exercise

## 2021-08-26 ENCOUNTER — Other Ambulatory Visit: Payer: Self-pay

## 2021-08-26 ENCOUNTER — Other Ambulatory Visit (INDEPENDENT_AMBULATORY_CARE_PROVIDER_SITE_OTHER): Payer: PPO

## 2021-08-26 DIAGNOSIS — Z0000 Encounter for general adult medical examination without abnormal findings: Secondary | ICD-10-CM

## 2021-08-26 DIAGNOSIS — G6289 Other specified polyneuropathies: Secondary | ICD-10-CM

## 2021-08-26 LAB — B12 AND FOLATE PANEL
Folate: >23.4 ng/mL (ref >5.9)
Vitamin B-12: 422 pg/mL (ref 211–911)

## 2021-08-26 LAB — COMPREHENSIVE METABOLIC PANEL
ALT: 20 U/L (ref 0–53)
AST: 23 U/L (ref 0–37)
Albumin: 4.2 g/dL (ref 3.5–5.2)
Alkaline Phosphatase: 59 U/L (ref 39–117)
BUN: 34 mg/dL — ABNORMAL HIGH (ref 6–23)
CO2: 28 mEq/L (ref 19–32)
Calcium: 9.3 mg/dL (ref 8.4–10.5)
Chloride: 106 mEq/L (ref 96–112)
Creatinine, Ser: 1.38 mg/dL (ref 0.40–1.50)
GFR: 49.06 mL/min — ABNORMAL LOW (ref >60.00)
Glucose, Bld: 89 mg/dL (ref 70–99)
Potassium: 4.2 mEq/L (ref 3.5–5.1)
Sodium: 142 mEq/L (ref 135–145)
Total Bilirubin: 0.8 mg/dL (ref 0.2–1.2)
Total Protein: 6.6 g/dL (ref 6.0–8.3)

## 2021-08-26 LAB — LIPID PANEL
Cholesterol: 218 mg/dL — ABNORMAL HIGH (ref 0–200)
HDL: 38.4 mg/dL — ABNORMAL LOW (ref >39.00)
LDL Cholesterol: 157 mg/dL — ABNORMAL HIGH (ref 0–99)
NonHDL: 179.8
Total CHOL/HDL Ratio: 6
Triglycerides: 113 mg/dL (ref 0.0–149.0)
VLDL: 22.6 mg/dL (ref 0.0–40.0)

## 2021-08-26 LAB — CBC WITH DIFFERENTIAL/PLATELET
Basophils Absolute: 0 10*3/uL (ref 0.0–0.1)
Basophils Relative: 0.7 % (ref 0.0–3.0)
Eosinophils Absolute: 0.1 10*3/uL (ref 0.0–0.7)
Eosinophils Relative: 1.4 % (ref 0.0–5.0)
HCT: 44.6 % (ref 39.0–52.0)
Hemoglobin: 15 g/dL (ref 13.0–17.0)
Lymphocytes Relative: 44.5 % (ref 12.0–46.0)
Lymphs Abs: 2.5 10*3/uL (ref 0.7–4.0)
MCHC: 33.7 g/dL (ref 30.0–36.0)
MCV: 93.8 fl (ref 78.0–100.0)
Monocytes Absolute: 0.5 10*3/uL (ref 0.1–1.0)
Monocytes Relative: 8.7 % (ref 3.0–12.0)
Neutro Abs: 2.5 10*3/uL (ref 1.4–7.7)
Neutrophils Relative %: 44.7 % (ref 43.0–77.0)
Platelets: 174 10*3/uL (ref 150.0–400.0)
RBC: 4.76 Mil/uL (ref 4.22–5.81)
RDW: 13.7 % (ref 11.5–15.5)
WBC: 5.6 10*3/uL (ref 4.0–10.5)

## 2021-08-26 LAB — TSH: TSH: 3.82 u[IU]/mL (ref 0.35–5.50)

## 2021-08-26 LAB — VITAMIN D 25 HYDROXY (VIT D DEFICIENCY, FRACTURES): VITD: 28.66 ng/mL — ABNORMAL LOW (ref 30.00–100.00)

## 2021-08-26 LAB — HEMOGLOBIN A1C: Hgb A1c MFr Bld: 5.4 % (ref 4.6–6.5)

## 2021-08-26 LAB — PSA: PSA: 0.09 ng/mL — ABNORMAL LOW (ref 0.10–4.00)

## 2021-08-29 LAB — HEPATITIS C ANTIBODY
Hepatitis C Ab: NONREACTIVE
SIGNAL TO CUT-OFF: 0.09 (ref <1.00)

## 2021-09-01 ENCOUNTER — Other Ambulatory Visit: Payer: Self-pay

## 2021-09-01 ENCOUNTER — Telehealth: Payer: Self-pay

## 2021-09-01 MED ORDER — ROSUVASTATIN CALCIUM 5 MG PO TABS: 5.0000 mg | ORAL_TABLET | Freq: Every day | ORAL | 1 refills | Status: DC

## 2021-09-01 NOTE — Telephone Encounter (Signed)
LMTCB for lab results.  

## 2021-09-12 ENCOUNTER — Telehealth: Payer: Self-pay | Admitting: *Deleted

## 2021-09-12 ENCOUNTER — Other Ambulatory Visit: Payer: Self-pay

## 2021-09-12 DIAGNOSIS — R899 Unspecified abnormal finding in specimens from other organs, systems and tissues: Secondary | ICD-10-CM

## 2021-09-12 NOTE — Telephone Encounter (Signed)
BMP ordered & PCP changed!

## 2021-09-12 NOTE — Telephone Encounter (Signed)
Please place future orders for lab appt.    NOTE: PCP in chart needs to be changed as well.

## 2021-09-13 ENCOUNTER — Other Ambulatory Visit (INDEPENDENT_AMBULATORY_CARE_PROVIDER_SITE_OTHER): Payer: PPO

## 2021-09-13 ENCOUNTER — Other Ambulatory Visit: Payer: Self-pay

## 2021-09-13 DIAGNOSIS — R899 Unspecified abnormal finding in specimens from other organs, systems and tissues: Secondary | ICD-10-CM

## 2021-09-13 LAB — BASIC METABOLIC PANEL
BUN: 23 mg/dL (ref 6–23)
CO2: 30 mEq/L (ref 19–32)
Calcium: 8.9 mg/dL (ref 8.4–10.5)
Chloride: 106 mEq/L (ref 96–112)
Creatinine, Ser: 1.21 mg/dL (ref 0.40–1.50)
GFR: 57.42 mL/min — ABNORMAL LOW (ref >60.00)
Glucose, Bld: 81 mg/dL (ref 70–99)
Potassium: 4.2 mEq/L (ref 3.5–5.1)
Sodium: 141 mEq/L (ref 135–145)

## 2021-10-12 ENCOUNTER — Other Ambulatory Visit: Payer: Self-pay

## 2021-10-12 ENCOUNTER — Telehealth: Payer: Self-pay | Admitting: Adult Health

## 2021-10-12 DIAGNOSIS — E785 Hyperlipidemia, unspecified: Secondary | ICD-10-CM

## 2021-10-12 NOTE — Telephone Encounter (Signed)
Patient ordered CMP for starting Crestor.

## 2021-10-12 NOTE — Telephone Encounter (Signed)
Patient has a lab appt on 10/14/2021, there are no orders in.

## 2021-10-14 ENCOUNTER — Other Ambulatory Visit (INDEPENDENT_AMBULATORY_CARE_PROVIDER_SITE_OTHER): Payer: PPO

## 2021-10-14 ENCOUNTER — Other Ambulatory Visit: Payer: Self-pay

## 2021-10-14 DIAGNOSIS — E785 Hyperlipidemia, unspecified: Secondary | ICD-10-CM | POA: Diagnosis not present

## 2021-10-14 LAB — COMPREHENSIVE METABOLIC PANEL
ALT: 19 U/L (ref 0–53)
AST: 25 U/L (ref 0–37)
Albumin: 4.1 g/dL (ref 3.5–5.2)
Alkaline Phosphatase: 49 U/L (ref 39–117)
BUN: 24 mg/dL — ABNORMAL HIGH (ref 6–23)
CO2: 28 mEq/L (ref 19–32)
Calcium: 9.2 mg/dL (ref 8.4–10.5)
Chloride: 106 mEq/L (ref 96–112)
Creatinine, Ser: 1.14 mg/dL (ref 0.40–1.50)
GFR: 61.64 mL/min (ref >60.00)
Glucose, Bld: 84 mg/dL (ref 70–99)
Potassium: 4.2 mEq/L (ref 3.5–5.1)
Sodium: 139 mEq/L (ref 135–145)
Total Bilirubin: 0.8 mg/dL (ref 0.2–1.2)
Total Protein: 6.7 g/dL (ref 6.0–8.3)

## 2021-10-27 ENCOUNTER — Encounter: Payer: Self-pay | Admitting: Student in an Organized Health Care Education/Training Program

## 2021-10-27 ENCOUNTER — Ambulatory Visit
Payer: PPO | Attending: Student in an Organized Health Care Education/Training Program | Admitting: Student in an Organized Health Care Education/Training Program

## 2021-10-27 ENCOUNTER — Other Ambulatory Visit: Payer: Self-pay

## 2021-10-27 VITALS — BP 132/87 | Temp 97.3°F | Resp 18 | Ht 77.0 in | Wt 255.0 lb

## 2021-10-27 DIAGNOSIS — M79672 Pain in left foot: Secondary | ICD-10-CM | POA: Diagnosis not present

## 2021-10-27 DIAGNOSIS — G6289 Other specified polyneuropathies: Secondary | ICD-10-CM | POA: Diagnosis not present

## 2021-10-27 DIAGNOSIS — M722 Plantar fascial fibromatosis: Secondary | ICD-10-CM | POA: Diagnosis not present

## 2021-10-27 MED ORDER — PREGABALIN 50 MG PO CAPS: 50.0000 mg | ORAL_CAPSULE | Freq: Three times a day (TID) | ORAL | 1 refills | Status: DC

## 2021-10-27 MED ORDER — NONFORMULARY OR COMPOUNDED ITEM: 1.0000 mL | Freq: Four times a day (QID) | 2 refills | Status: AC | PRN

## 2021-10-27 NOTE — Progress Notes (Signed)
Patient: Joshua Dominguez  Service Category: E/M  Provider: Gillis Santa, MD  DOB: 18-Aug-1941  DOS: 10/27/2021  Referring Provider: Burnard Hawthorne, FNP  MRN: [id number]  Setting: Ambulatory outpatient  PCP: Burnard Hawthorne, FNP  Type: New Patient  Specialty: Interventional Pain Management    Location: Office  Delivery: Face-to-face     Primary Reason(s) for Visit: Encounter for initial evaluation of one or more chronic problems (new to examiner) potentially causing chronic pain, and posing a threat to normal musculoskeletal function. (Level of risk: High) CC: Foot Pain (keft)  HPI  Joshua Dominguez is a 80 y.o. year old, male patient, who comes for the first time to our practice referred by Burnard Hawthorne, FNP for our initial evaluation of his chronic pain. 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 Today he comes in for evaluation of his Foot Pain (keft)  Pain Assessment: Location: Left Foot Radiating: denies. stay in the arch of foot and top Onset: More than a month ago Duration: Chronic pain Quality: Aching, Burning, Numbness (Neuropathy in foot (unknown)) Severity: 1 /10 (subjective, self-reported pain score)  Effect on ADL: prolonged standing and sitting. Does not know where pain came from. Timing: Intermittent Modifying factors: exercise, BP: 132/87   HR:    Onset and Duration: Present longer than 3 months Cause of pain: Unknown Severity: No change since onset, NAS-11 at its worse: 9/10, NAS-11 at its best: 1/10, NAS-11 now: 2/10, and NAS-11 on  the average: 2/10 Timing: Night and After a period of immobility Aggravating Factors: Prolonged sitting Alleviating Factors: Walking and running. Associated Problems: Numbness, Spasms, Tingling, Pain that wakes patient up, and Pain that does not allow patient to sleep Quality of Pain: Burning, Pulsating, Tender, Throbbing, and Uncomfortable Previous Examinations or Tests: MRI scan, Nerve block, Nerve conduction test, Neurological evaluation, and Chiropractic evaluation Previous Treatments: TENS  Joshua Dominguez is is a pleasant 80 year old male who presents with a chief complaint of left foot pain that has been chronic in nature, going on for over 10 years.  He states that he has an extensive work-up done including nerve conduction velocity/EMG study which was negative for any sensory neuropathy of lower extremity.  He has had lumbar spine MRI which was unremarkable.  He has done physical therapy, chiropractic therapy, acupuncture with no benefit.  He is currently on gabapentin 1800 mg a day.  He is a long-distance runner.  He has a history of plantar fasciitis status post plantar fasciitis surgery this past summer.  Describes burning and tingling pain along the medial aspect in the plantar aspect of his foot.  No discoloration noted.  No allodynia or hypersensitivity.  No sudomotor or trophic changes to suggest CRPS.   Meds   Current Outpatient Medications:    amitriptyline (ELAVIL) 50 MG tablet, TAKE 1 TABLET BY MOUTH DAILY AT BEDTIME, Disp: 90 tablet, Rfl: 3   colchicine 0.6 MG tablet, Take 1 tablet (0.6 mg total) by mouth as needed. Take two by mouth now then one by mouth in one hour if no better., Disp: 6 tablet, Rfl: 1   Febuxostat 80 MG TABS, TAKE 1 TABLET(80 MG) BY  MOUTH DAILY, Disp: 90 tablet, Rfl: 1   glucosamine-chondroitin 500-400 MG tablet, Take 1 tablet by mouth 2 (two) times daily., Disp: , Rfl:    Multiple Vitamin (MULTIVITAMIN) tablet, Take 1 tablet by mouth daily., Disp: , Rfl:     NONFORMULARY OR COMPOUNDED ITEM, Apply 1-2 mLs topically 4 (four) times daily as needed (for pain)., Disp: 1 each, Rfl: 2   Omega-3 Fatty Acids (FISH OIL) 1200 MG CAPS, Take 1 capsule by mouth 2 (two) times daily., Disp: , Rfl:    pregabalin (LYRICA) 50 MG capsule, Take 1 capsule (50 mg total) by mouth 3 (three) times daily., Disp: 90 capsule, Rfl: 1  Imaging Review   Narrative *RADIOLOGY REPORT*  Clinical Data: Left sided neck pain.  No known injuries.  CERVICAL SPINE - COMPLETE 4+ VIEW 05/30/2011:  Comparison: None.  Findings: Straightening of the usual cervical lordosis.  Anatomic posterior alignment.  No visible fractures.  Marked disc space narrowing and endplate hypertrophic changes at C5-6.  Moderate disc space narrowing and endplate hypertrophic changes at C6-7.  Mild disc space narrowing and endplate hypertrophic changes at C4-5. Normal prevertebral soft tissues.  Neural foramina difficult to evaluate on the oblique views due to the obliquity.  No static evidence of instability.  IMPRESSION: Straightening of the usual lordosis which may reflect patient positioning and/or spasm.  Degenerative disc disease and spondylosis at C5-6 (severe), C6-7 (moderate), and C4-5 (mild).  Original Report Authenticated By: Deniece Portela, M.D. Lumbosacral Imaging: Lumbar MR wo contrast: Results for orders placed during the hospital encounter of 06/18/06  MR Lumbar Spine Wo Contrast  Narrative Clinical Data: Low back pain. Left foot pain. MRI LUMBAR SPINE WITHOUT CONTRAST: Technique: Multiplanar and multiecho pulse sequences of the lumbar spine, to include the lower thoracic and upper sacral regions, were obtained according to standard protocol without IV contrast. Comparison: Recent outside radiographs. Findings: Normal alignment and no fracture. The conus medullaris is normal. L1-2: Negative. L2-3: Small left-sided disc protrusion extending into the foramina. L3-4: Broad based  shallow disc protrusion without significant spinal stenosis. There is mild facet arthropathy. L5-S1: There is diffuse spondylosis with bony overgrowth of the vertebral body left greater than right. There may also be some left-sided disc protrusion. There is narrowing of the lateral recess bilaterally especially on the left. Central canal is adequately patent. L5-S1: Small central disc protrusion without stenosis.  Impression 1. Small left-sided disc protrusion at L2-3 extending into the foramina. 2. Diffuse disc protrusion at L3-4 slightly eccentric to the right without significant spinal stenosis. 3. Diffuse spondylotic change at L4-5 with central to left-sided disc protrusion and biforaminal narrowing left greater than right. There is mild central canal stenosis.  Provider: Weyman Rodney  Foot Imaging: Foot-R DG Complete: Results for orders placed during the hospital encounter of 02/17/15  DG Foot Complete Right  Narrative CLINICAL DATA:  Pain and tenderness.  EXAM: RIGHT FOOT COMPLETE - 3+ VIEW  COMPARISON:  None.  FINDINGS: No acute bony or joint abnormality. Diffuse mild degenerative change. No evidence of fracture dislocation.  IMPRESSION: No acute abnormality.   Electronically Signed By: Marcello Moores  Register On: 02/17/2015 12:53  Foot-L DG Complete: Results for orders placed in visit on 02/26/19  DG Foot Complete Left  Narrative Please see detailed radiograph report in office note.    Complexity Note: Imaging results reviewed. Results shared with Joshua Dominguez, using Layman's terms.  ROS  Cardiovascular: No reported cardiovascular signs or symptoms such as High blood pressure, coronary artery disease, abnormal heart rate or rhythm, heart attack, blood thinner therapy or heart weakness and/or failure Pulmonary or Respiratory: Snoring  and Temporary stoppage of breathing during sleep Neurological: No reported neurological signs or symptoms such as  seizures, abnormal skin sensations, urinary and/or fecal incontinence, being born with an abnormal open spine and/or a tethered spinal cord Psychological-Psychiatric: No reported psychological or psychiatric signs or symptoms such as difficulty sleeping, anxiety, depression, delusions or hallucinations (schizophrenial), mood swings (bipolar disorders) or suicidal ideations or attempts Gastrointestinal: Irregular, infrequent bowel movements (Constipation) Genitourinary: Passing kidney stones Hematological: No reported hematological signs or symptoms such as prolonged bleeding, low or poor functioning platelets, bruising or bleeding easily, hereditary bleeding problems, low energy levels due to low hemoglobin or being anemic Endocrine: No reported endocrine signs or symptoms such as high or low blood sugar, rapid heart rate due to high thyroid levels, obesity or weight gain due to slow thyroid or thyroid disease Rheumatologic: No reported rheumatological signs and symptoms such as fatigue, joint pain, tenderness, swelling, redness, heat, stiffness, decreased range of motion, with or without associated rash Musculoskeletal: Negative for myasthenia gravis, muscular dystrophy, multiple sclerosis or malignant hyperthermia Work History: Working part time  Allergies  Joshua Dominguez is allergic to celebrex [celecoxib] and oxymetazoline hcl.  Laboratory Chemistry Profile   Renal Lab Results  Component Value Date   BUN 24 (H) 10/14/2021   CREATININE 1.14 10/14/2021   BCR 18 03/15/2020   GFR 61.64 10/14/2021   GFRAA 76 02/20/2019   GFRNONAA 65 02/20/2019   PROTEINUR NEGATIVE 05/31/2021     Electrolytes Lab Results  Component Value Date   NA 139 10/14/2021   K 4.2 10/14/2021   CL 106 10/14/2021   CALCIUM 9.2 10/14/2021     Hepatic Lab Results  Component Value Date   AST 25 10/14/2021   ALT 19 10/14/2021   ALBUMIN 4.1 10/14/2021   ALKPHOS 49 10/14/2021     ID No results found for:  LYMEIGGIGMAB, HIV, SARSCOV2NAA, STAPHAUREUS, MRSAPCR, HCVAB, PREGTESTUR, RMSFIGG, QFVRPH1IGG, QFVRPH2IGG, LYMEIGGIGMAB   Bone Lab Results  Component Value Date   VD25OH 28.66 (L) 08/26/2021     Endocrine Lab Results  Component Value Date   GLUCOSE 84 10/14/2021   GLUCOSEU NEGATIVE 05/31/2021   HGBA1C 5.4 08/26/2021   TSH 3.82 08/26/2021     Neuropathy Lab Results  Component Value Date   VITAMINB12 422 08/26/2021   FOLATE >23.4 08/26/2021   HGBA1C 5.4 08/26/2021     CNS No results found for: COLORCSF, APPEARCSF, RBCCOUNTCSF, WBCCSF, POLYSCSF, LYMPHSCSF, EOSCSF, PROTEINCSF, GLUCCSF, JCVIRUS, CSFOLI, IGGCSF, LABACHR, ACETBL, LABACHR, ACETBL   Inflammation (CRP: Acute   ESR: Chronic) No results found for: CRP, ESRSEDRATE, LATICACIDVEN   Rheumatology Lab Results  Component Value Date   LABURIC 3.7 (L) 02/20/2019     Coagulation Lab Results  Component Value Date   INR 0.99 05/08/2013   LABPROT 12.9 05/08/2013   APTT 30 05/08/2013   PLT 174.0 08/26/2021     Cardiovascular Lab Results  Component Value Date   HGB 15.0 08/26/2021   HCT 44.6 08/26/2021     Screening No results found for: SARSCOV2NAA, COVIDSOURCE, STAPHAUREUS, MRSAPCR, HCVAB, HIV, PREGTESTUR   Cancer No results found for: CEA, CA125, LABCA2   Allergens No results found for: ALMOND, APPLE, ASPARAGUS, AVOCADO, BANANA, BARLEY, BASIL, BAYLEAF, GREENBEAN, LIMABEAN, WHITEBEAN, BEEFIGE, REDBEET, BLUEBERRY, BROCCOLI, CABBAGE, MELON, CARROT, CASEIN, CASHEWNUT, CAULIFLOWER,  CELERY     Note: Lab results reviewed.  [place]  Drug: Joshua Dominguez  reports no history of drug use. Alcohol:  reports current alcohol use of about 14.0 standard drinks per week. Tobacco:  reports that he quit smoking about 44 years ago. His smoking use included pipe. He has never used smokeless tobacco. Medical:  has a past medical history of Allergic rhinitis, Bilateral hydrocele, DDD (degenerative disc disease), lumbar, ED (erectile  dysfunction) of organic origin, Gout, Hemorrhoids, History of prostatitis, Lumbar herniated disc (2007), and Prostate cancer ([place]). Family: family history includes Gout in his father; Heart disease in his brother and sister; Hypertension in his father; Lung cancer in his maternal grandfather and mother; Prostate cancer in his father.  Past Surgical History:  Procedure Laterality Date   APPENDECTOMY  FEB 2000   CATARACT EXTRACTION W/ INTRAOCULAR LENS  IMPLANT, BILATERAL     GASTROCNEMIUS RECESSION Left 06/09/2021   Procedure: Left gastroc recession;  Surgeon: Wylene Simmer, MD;  Location: [place];  Service: Orthopedics;  Laterality: Left;   HYDROCELE EXCISION Bilateral 01/02/2014   Procedure: BILATERAL HYDROCELECTOMY;  Surgeon: Claybon Jabs, MD;  Location: [facility];  Service: Urology;  Laterality: Bilateral;   HYDROCELE EXCISION Left 04/17/2014   Procedure: LEFT HYDROCELECTOMY ADULT;  Surgeon: Claybon Jabs, MD;  Location: [facility];  Service: Urology;  Laterality: Left;   KNEE ARTHROSCOPY Left    PLANTAR FASCIA RELEASE Left 06/09/2021   Procedure: Plantar fascia release;  Surgeon: Wylene Simmer, MD;  Location: [place];  Service: Orthopedics;  Laterality: Left;   RADIOACTIVE SEED IMPLANT N/A 05/09/2013   Procedure: RADIOACTIVE SEED IMPLANT;  Surgeon: Claybon Jabs, MD;  Location: [facility];  Service: Urology;  Laterality: N/A;   Active Ambulatory Problems    Diagnosis Date Noted   DYSLIPIDEMIA 06/14/2010   GOUT 07/24/2007   CERUMEN IMPACTION, BILATERAL 08/08/2007   SINUSITIS, CHRONIC 07/24/2007   ALLERGIC RHINITIS 07/24/2007   DEGENERATIVE DISC DISEASE, LUMBAR SPINE 07/24/2007   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   ELEVATED BLOOD PRESSURE WITHOUT DIAGNOSIS OF HYPERTENSION 07/24/2007   Neck pain 04/28/2011   Left foot pain 04/28/2011   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 and 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  02/05/2013   Hydrocele, bilateral 01/01/2014   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   Plantar fasciitis of left foot 02/09/2016   Routine physical examination 08/24/2021   Peripheral neuropathy 08/24/2021   Resolved Ambulatory Problems    Diagnosis Date Noted   No Resolved Ambulatory Problems   Past Medical History:  Diagnosis Date   Allergic rhinitis    Bilateral hydrocele    DDD (degenerative disc disease), lumbar    ED (erectile dysfunction) of organic origin    Gout    Hemorrhoids    History of prostatitis    Lumbar herniated disc 2007   Constitutional Exam  General appearance: Well nourished, well developed, and well hydrated. In no apparent acute distress Vitals:   10/27/21 1357  BP: 132/87  Resp: 18  Temp: (!) 97.3 F (36.3 C)  SpO2: 98%  Weight: 255 lb (115.7 kg)  Height: '6\' 5"'  (1.956 m)   BMI Assessment: Estimated body mass index is 30.24 kg/m as calculated from the following:   Height as of this encounter: '6\' 5"'  (1.956 m).   Weight as of this encounter: 255 lb (115.7 kg).  BMI interpretation table: BMI level Category Range association with higher incidence  of chronic pain  <18 kg/m2 Underweight   18.5-24.9 kg/m2 Ideal body weight   25-29.9 kg/m2 Overweight Increased incidence by 20%  30-34.9 kg/m2 Obese (Class I) Increased incidence by 68%  35-39.9 kg/m2 Severe obesity (Class II) Increased incidence by 136%  >40 kg/m2 Extreme obesity (Class III) Increased incidence by 254%   Patient's current BMI Ideal Body weight  Body mass index is 30.24 kg/m. Ideal body weight: 89.1 kg (196 lb 6.9 oz) Adjusted ideal body weight: 99.7 kg (219 lb 13.7 oz)   BMI Readings from Last 4 Encounters:  10/27/21 30.24 kg/m  08/24/21 31.54 kg/m  06/09/21 32.00 kg/m  05/31/21 31.42 kg/m   Wt Readings from Last 4 Encounters:  10/27/21 255 lb (115.7 kg)  08/24/21 266 lb (120.7 kg)  06/09/21 269 lb 13.5 oz (122.4 kg)  05/31/21 265 lb (120.2 kg)     Psych/Mental status: Alert, oriented x 3 (person, place, & time)       Eyes: PERLA Respiratory: No evidence of acute respiratory distress  Left foot pain, surgical scar present from plantar fasciitis surgery, negative for discoloration, sudomotor, trophic changes.  Normal temperature.  Negative for allodynia  Assessment  Primary Diagnosis & Pertinent Problem List: The primary encounter diagnosis was Other polyneuropathy. Diagnoses of Plantar fasciitis of left foot and Left foot pain were also pertinent to this visit.  Visit Diagnosis (New problems to examiner): 1. Other polyneuropathy   2. Plantar fasciitis of left foot   3. Left foot pain    Plan of Care (Initial workup plan)    Idiopathic neuropathic pain of left foot.  Discussed transition from gabapentin to Lyrica as below and application of compounded cream to his left foot which includes ketamine, Flexeril, gabapentin as below.  Follow-up in 6 to 8 weeks to assess response.  Pharmacotherapy (current): Medications ordered:  Meds ordered this encounter  Medications   pregabalin (LYRICA) 50 MG capsule    Sig: Take 1 capsule (50 mg total) by mouth 3 (three) times daily.    Dispense:  90 capsule    Refill:  1    Fill one day early if pharmacy is closed on scheduled refill date. May substitute for generic if available.   NONFORMULARY OR COMPOUNDED ITEM    Sig: Apply 1-2 mLs topically 4 (four) times daily as needed (for pain).    Dispense:  1 each    Refill:  2    Please compound: Ketamine (10%) + Cyclobenzaprine (2%) + Gabapentin (6%) Cream. Dispense: 240 GM bottle   Medications administered during this visit: Joshua Dominguez "Bill" had no medications administered during this visit.       Provider-requested follow-up: Return in about 8 weeks (around 12/22/2021) for Medication Management, in person. I spent a total of 60 minutes reviewing chart data, face-to-face evaluation with the patient, counseling and coordination of  care as detailed above.   Future Appointments  Date Time Provider [employer]  12/20/2021  2:20 PM Gillis Santa, MD [facility] None    Note by: Gillis Santa, MD Date: 10/27/2021; Time: 2:51 PM

## 2021-10-27 NOTE — Progress Notes (Signed)
Safety precautions to be maintained throughout the outpatient stay will include: orient to surroundings, keep bed in low position, maintain call bell within reach at all times, provide assistance with transfer out of bed and ambulation.  

## 2021-11-08 ENCOUNTER — Other Ambulatory Visit: Payer: Self-pay | Admitting: Family Medicine

## 2021-11-08 ENCOUNTER — Other Ambulatory Visit: Payer: Self-pay | Admitting: Family

## 2021-12-19 ENCOUNTER — Ambulatory Visit: Payer: PPO | Admitting: Dermatology

## 2021-12-20 ENCOUNTER — Encounter: Payer: PPO | Admitting: Student in an Organized Health Care Education/Training Program

## 2022-01-02 ENCOUNTER — Ambulatory Visit: Payer: PPO | Admitting: Dermatology

## 2022-01-02 ENCOUNTER — Other Ambulatory Visit: Payer: Self-pay

## 2022-01-02 DIAGNOSIS — L578 Other skin changes due to chronic exposure to nonionizing radiation: Secondary | ICD-10-CM

## 2022-01-02 DIAGNOSIS — D485 Neoplasm of uncertain behavior of skin: Secondary | ICD-10-CM | POA: Diagnosis not present

## 2022-01-02 DIAGNOSIS — D492 Neoplasm of unspecified behavior of bone, soft tissue, and skin: Secondary | ICD-10-CM

## 2022-01-02 DIAGNOSIS — D1801 Hemangioma of skin and subcutaneous tissue: Secondary | ICD-10-CM | POA: Diagnosis not present

## 2022-01-02 DIAGNOSIS — L570 Actinic keratosis: Secondary | ICD-10-CM | POA: Diagnosis not present

## 2022-01-02 DIAGNOSIS — L719 Rosacea, unspecified: Secondary | ICD-10-CM | POA: Diagnosis not present

## 2022-01-02 MED ORDER — METRONIDAZOLE 1 % EX GEL
Freq: Every day | CUTANEOUS | 11 refills | Status: AC
Start: 2022-01-02 — End: ?

## 2022-01-02 NOTE — Progress Notes (Signed)
Follow-Up Visit   Subjective  Joshua Dominguez is a 81 y.o. male who presents for the following: check spot (Nose, sensitive, had a spot txted with LN2 in the past near this area) and Nevus (Thigh, would like checked, he keeps hitting with shorts).   The following portions of the chart were reviewed this encounter and updated as appropriate:       Review of Systems:  No other skin or systemic complaints except as noted in HPI or Assessment and Plan.  Objective  Well appearing patient in no apparent distress; mood and affect are within normal limits.  A focused examination was performed including face. Relevant physical exam findings are noted in the Assessment and Plan.  L nasal dorsum x 1, Nasal dorusm x 1 (2) Pink scaly macules  nose Erythema and small inflammatory paps  L medial post thigh 4.64mm red pap       Assessment & Plan   Actinic Damage - Severe, confluent actinic changes with pre-cancerous actinic keratoses  - Severe, chronic, not at goal, secondary to cumulative UV radiation exposure over time - diffuse scaly erythematous macules and papules with underlying dyspigmentation - Discussed Prescription "Field Treatment" for Severe, Chronic Confluent Actinic Changes with Pre-Cancerous Actinic Keratoses Field treatment involves treatment of an entire area of skin that has confluent Actinic Changes (Sun/ Ultraviolet light damage) and PreCancerous Actinic Keratoses by method of PhotoDynamic Therapy (PDT) and/or prescription Topical Chemotherapy agents such as 5-fluorouracil, 5-fluorouracil/calcipotriene, and/or imiquimod.  The purpose is to decrease the number of clinically evident and subclinical PreCancerous lesions to prevent progression to development of skin cancer by chemically destroying early precancer changes that may or may not be visible.  It has been shown to reduce the risk of developing skin cancer in the treated area. As a result of treatment, redness, scaling,  crusting, and open sores may occur during treatment course. One or more than one of these methods may be used and may have to be used several times to control, suppress and eliminate the PreCancerous changes. Discussed treatment course, expected reaction, and possible side effects. - Recommend daily broad spectrum sunscreen SPF 30+ to sun-exposed areas, reapply every 2 hours as needed.  - Staying in the shade or wearing long sleeves, sun glasses (UVA+UVB protection) and wide brim hats (4-inch brim around the entire circumference of the hat) are also recommended. - Call for new or changing lesions. 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 x 1, Nasal dorusm x 1  Discussed doing 5-7 day course of topical 5FU/Vit D cream on f/up  Destruction of lesion - L nasal dorsum x 1, Nasal dorusm x 1  Destruction method: cryotherapy   Informed consent: discussed and consent obtained   Lesion destroyed using liquid nitrogen: Yes   Region frozen until ice ball extended beyond lesion: Yes   Outcome: patient tolerated procedure well with no complications   Post-procedure details: wound care instructions given   Additional details:  Prior to procedure, discussed risks of blister formation, small wound, skin dyspigmentation, or rare scar following cryotherapy. Recommend Vaseline ointment to treated areas while healing.   Rosacea nose  Chronic condition with duration or expected duration over one year. Currently well-controlled.   Rosacea is a chronic progressive skin condition usually affecting the face of adults, causing redness and/or acne bumps. It is treatable but not curable. It sometimes affects the eyes (ocular rosacea) as well. It may respond to topical  and/or systemic medication and can flare with stress, sun exposure, alcohol, exercise and some foods.  Daily application of broad spectrum spf 30+ sunscreen to face is recommended to reduce  flares.  Cont Metrogel 1% qhs to aas face  metroNIDAZOLE (METROGEL) 1 % gel - nose Apply topically at bedtime. Qhs to face for Rosacea  Neoplasm of skin L medial post thigh  Epidermal / dermal shaving  Lesion diameter (cm):  0.5 Informed consent: discussed and consent obtained   Patient was prepped and draped in usual sterile fashion: area prepped with alcohol. Anesthesia: the lesion was anesthetized in a standard fashion   Anesthetic:  1% lidocaine w/ epinephrine 1-100,000 buffered w/ 8.4% NaHCO3 Instrument used: flexible razor blade   Hemostasis achieved with: pressure, aluminum chloride and electrodesiccation   Outcome: patient tolerated procedure well   Post-procedure details: wound care instructions given   Post-procedure details comment:  Ointment and small bandage applied  Specimen 1 - Surgical pathology Differential Diagnosis: D48.5 Irritated Hemangioma vs other  Check Margins: No 4.17mm red pap   Return in about 3 months (around 04/01/2022) for AK f/u nose.  I, Othelia Pulling, RMA, am acting as scribe for Brendolyn Patty, MD .  Documentation: I have reviewed the above documentation for accuracy and completeness, and I agree with the above.  Brendolyn Patty MD

## 2022-01-02 NOTE — Patient Instructions (Addendum)
Cryotherapy Aftercare  Wash gently with soap and water everyday.   Apply Vaseline and Band-Aid daily until healed.    Wound Care Instructions  Cleanse wound gently with soap and water once a day then pat dry with clean gauze. Apply a thing coat of Petrolatum (petroleum jelly, "Vaseline") over the wound (unless you have an allergy to this). We recommend that you use a new, sterile tube of Vaseline. Do not pick or remove scabs. Do not remove the yellow or white "healing tissue" from the base of the wound.  Cover the wound with fresh, clean, nonstick gauze and secure with paper tape. You may use Band-Aids in place of gauze and tape if the would is small enough, but would recommend trimming much of the tape off as there is often too much. Sometimes Band-Aids can irritate the skin.  You should call the office for your biopsy report after 1 week if you have not already been contacted.  If you experience any problems, such as abnormal amounts of bleeding, swelling, significant bruising, significant pain, or evidence of infection, please call the office immediately.  FOR ADULT SURGERY PATIENTS: If you need something for pain relief you may take 1 extra strength Tylenol (acetaminophen) AND 2 Ibuprofen (200mg each) together every 4 hours as needed for pain. (do not take these if you are allergic to them or if you have a reason you should not take them.) Typically, you may only need pain medication for 1 to 3 days.        If You Need Anything After Your Visit  If you have any questions or concerns for your doctor, please call our main line at [phone] and press option 4 to reach your doctor's medical assistant. If no one answers, please leave a voicemail as directed and we will return your call as soon as possible. Messages left after 4 pm will be answered the following business day.   You may also send us a message via MyChart. We typically respond to MyChart messages within 1-2 business  days.  For prescription refills, please ask your pharmacy to contact our office. Our fax number is [phone].  If you have an urgent issue when the clinic is closed that cannot wait until the next business day, you can page your doctor at the number below.    Please note that while we do our best to be available for urgent issues outside of office hours, we are not available 24/7.   If you have an urgent issue and are unable to reach us, you may choose to seek medical care at your doctor's office, retail clinic, urgent care center, or emergency room.  If you have a medical emergency, please immediately call 911 or go to the emergency department.  Pager Numbers  - Dr. Kowalski: [phone]  - Dr. Moye: [phone]  - Dr. Stewart: [phone]  In the event of inclement weather, please call our main line at [phone] for an update on the status of any delays or closures.  Dermatology Medication Tips: Please keep the boxes that topical medications come in in order to help keep track of the instructions about where and how to use these. Pharmacies typically print the medication instructions only on the boxes and not directly on the medication tubes.   If your medication is too expensive, please contact our office at [phone] option 4 or send us a message through MyChart.   We are unable to tell what your co-pay for medications will be   in advance as this is different depending on your insurance coverage. However, we may be able to find a substitute medication at lower cost or fill out paperwork to get insurance to cover a needed medication.   If a prior authorization is required to get your medication covered by your insurance company, please allow us 1-2 business days to complete this process.  Drug prices often vary depending on where the prescription is filled and some pharmacies may offer cheaper prices.  The website www.goodrx.com contains coupons for medications through  different pharmacies. The prices here do not account for what the cost may be with help from insurance (it may be cheaper with your insurance), but the website can give you the price if you did not use any insurance.  - You can print the associated coupon and take it with your prescription to the pharmacy.  - You may also stop by our office during regular business hours and pick up a GoodRx coupon card.  - If you need your prescription sent electronically to a different pharmacy, notify our office through [facility] MyChart or by phone at [phone] option 4.     Si Usted Necesita Algo Despus de Su Visita  Tambin puede enviarnos un mensaje a travs de MyChart. Por lo general respondemos a los mensajes de MyChart en el transcurso de 1 a 2 das hbiles.  Para renovar recetas, por favor pida a su farmacia que se ponga en contacto con nuestra oficina. Nuestro nmero de fax es el [phone].  Si tiene un asunto urgente cuando la clnica est cerrada y que no puede esperar hasta el siguiente da hbil, puede llamar/localizar a su doctor(a) al nmero que aparece a continuacin.   Por favor, tenga en cuenta que aunque hacemos todo lo posible para estar disponibles para asuntos urgentes fuera del horario de oficina, no estamos disponibles las 24 horas del da, los 7 das de la semana.   Si tiene un problema urgente y no puede comunicarse con nosotros, puede optar por buscar atencin mdica  en el consultorio de su doctor(a), en una clnica privada, en un centro de atencin urgente o en una sala de emergencias.  Si tiene una emergencia mdica, por favor llame inmediatamente al 911 o vaya a la sala de emergencias.  Nmeros de bper  - Dr. Kowalski: [phone]  - Dra. Moye: [phone]  - Dra. Stewart: [phone]  En caso de inclemencias del tiempo, por favor llame a nuestra lnea principal al [phone] para una actualizacin sobre el estado de cualquier retraso o cierre.  Consejos  para la medicacin en dermatologa: Por favor, guarde las cajas en las que vienen los medicamentos de uso tpico para ayudarle a seguir las instrucciones sobre dnde y cmo usarlos. Las farmacias generalmente imprimen las instrucciones del medicamento slo en las cajas y no directamente en los tubos del medicamento.   Si su medicamento es muy caro, por favor, pngase en contacto con nuestra oficina llamando al [phone] y presione la opcin 4 o envenos un mensaje a travs de MyChart.   No podemos decirle cul ser su copago por los medicamentos por adelantado ya que esto es diferente dependiendo de la cobertura de su seguro. Sin embargo, es posible que podamos encontrar un medicamento sustituto a menor costo o llenar un formulario para que el seguro cubra el medicamento que se considera necesario.   Si se requiere una autorizacin previa para que su compaa de seguros cubra su medicamento, por favor permtanos de 1 a   2 das hbiles para completar este proceso.  Los precios de los medicamentos varan con frecuencia dependiendo del lugar de dnde se surte la receta y alguna farmacias pueden ofrecer precios ms baratos.  El sitio web www.goodrx.com tiene cupones para medicamentos de diferentes farmacias. Los precios aqu no tienen en cuenta lo que podra costar con la ayuda del seguro (puede ser ms barato con su seguro), pero el sitio web puede darle el precio si no utiliz ningn seguro.  - Puede imprimir el cupn correspondiente y llevarlo con su receta a la farmacia.  - Tambin puede pasar por nuestra oficina durante el horario de atencin regular y recoger una tarjeta de cupones de GoodRx.  - Si necesita que su receta se enve electrnicamente a una farmacia diferente, informe a nuestra oficina a travs de MyChart de [facility] o por telfono llamando al [phone] y presione la opcin 4.  

## 2022-01-03 ENCOUNTER — Telehealth: Payer: Self-pay

## 2022-01-03 NOTE — Telephone Encounter (Signed)
Left pt msg to call for bx result/sh °

## 2022-01-03 NOTE — Telephone Encounter (Signed)
-----   Message from Brendolyn Patty, MD sent at 01/03/2022  5:24 PM EST ----- Skin , left medial post thigh HEMANGIOMA  Benign - please call patient

## 2022-01-07 ENCOUNTER — Other Ambulatory Visit: Payer: Self-pay | Admitting: Family Medicine

## 2022-01-07 DIAGNOSIS — G629 Polyneuropathy, unspecified: Secondary | ICD-10-CM

## 2022-01-09 ENCOUNTER — Telehealth: Payer: Self-pay

## 2022-01-09 NOTE — Telephone Encounter (Signed)
-----   Message from Brendolyn Patty, MD sent at 01/03/2022  5:24 PM EST ----- Skin , left medial post thigh HEMANGIOMA  Benign - please call patient

## 2022-01-09 NOTE — Telephone Encounter (Signed)
Left pt msg to call for bx result/sh °

## 2022-01-10 ENCOUNTER — Telehealth: Payer: Self-pay

## 2022-01-10 NOTE — Telephone Encounter (Signed)
-----   Message from Brendolyn Patty, MD sent at 01/03/2022  5:24 PM EST ----- Skin , left medial post thigh HEMANGIOMA  Benign - please call patient

## 2022-01-10 NOTE — Telephone Encounter (Signed)
Left message advising patient biopsy was a benign hemangioma and no further treatment needed. Patient to call with any questions.

## 2022-01-20 ENCOUNTER — Other Ambulatory Visit: Payer: Self-pay | Admitting: Family Medicine

## 2022-01-20 ENCOUNTER — Other Ambulatory Visit: Payer: Self-pay | Admitting: Student in an Organized Health Care Education/Training Program

## 2022-01-20 DIAGNOSIS — G629 Polyneuropathy, unspecified: Secondary | ICD-10-CM

## 2022-01-24 ENCOUNTER — Other Ambulatory Visit: Payer: Self-pay | Admitting: Family

## 2022-01-24 DIAGNOSIS — G629 Polyneuropathy, unspecified: Secondary | ICD-10-CM

## 2022-01-24 NOTE — Telephone Encounter (Signed)
Pt called in requesting refill on medications (amitriptyline (ELAVIL) 50 MG tablet) and (pregabalin (LYRICA) 50 MG capsule). Pt stated that his other provider denied refill on medication. Pt was wondering if Np Arnett can refill the medication for him because he is experiencing some unpleasant night in pain. Pt requesting callback  ?

## 2022-03-10 ENCOUNTER — Ambulatory Visit (INDEPENDENT_AMBULATORY_CARE_PROVIDER_SITE_OTHER): Payer: PPO | Admitting: Family

## 2022-03-10 ENCOUNTER — Encounter: Payer: Self-pay | Admitting: Family

## 2022-03-10 VITALS — BP 112/68 | HR 74 | Temp 98.5°F | Ht 77.0 in | Wt 255.8 lb

## 2022-03-10 DIAGNOSIS — I700 Atherosclerosis of aorta: Secondary | ICD-10-CM | POA: Diagnosis not present

## 2022-03-10 DIAGNOSIS — R7301 Impaired fasting glucose: Secondary | ICD-10-CM

## 2022-03-10 DIAGNOSIS — G6289 Other specified polyneuropathies: Secondary | ICD-10-CM | POA: Diagnosis not present

## 2022-03-10 DIAGNOSIS — E785 Hyperlipidemia, unspecified: Secondary | ICD-10-CM

## 2022-03-10 MED ORDER — MELOXICAM 7.5 MG PO TABS: 7.5000 mg | ORAL_TABLET | Freq: Every day | ORAL | 1 refills | Status: DC | PRN

## 2022-03-10 NOTE — Assessment & Plan Note (Addendum)
The 10-year ASCVD risk score (Leilany Digeronimo DK, et al., 2019) is: 26.1% ?Discussed ASCVD score and known atherosclerosis. He remains diligent to exercise and eating healthy. He declines statin therapy.  ?

## 2022-03-10 NOTE — Assessment & Plan Note (Signed)
Chronic, symptomatically stable. He declines statin therapy.  ?

## 2022-03-10 NOTE — Patient Instructions (Addendum)
You may increase amitriptyline to '75mg'$  at night and see if helpful.  ? ?You may take mobic 7.'5mg'$  for severe foot pain. If not effective, you may increase mobic to '15mg'$ .  ? ?A couple of points in regards to meloxicam ( Mobic) - ? ?This medication is not intended for daily , long term use. It is a potent anti inflammatory ( NSAID), and my intention is for you take as needed for moderate to severe pain. If you find yourself using daily, please let me know.  ? ?Please takes Mobic ( meloxicam) with FOOD since it is an anti-inflammatory as it can cause a GI bleed or ulcer. If you have a history of GI bleed or ulcer, please do NOT take.  ?Do no take over the counter aleve, motrin, advil, goody's powder for pain as they are also NSAIDs, and they are  in the same class as Mobic ? ?Lastly, we will need to monitor kidney function while on Mobic, and if we were to see any decline in kidney function in the future, we would have to discontinue this medication.  ? ?Let me know you are doing ? ? ? ? ?

## 2022-03-10 NOTE — Assessment & Plan Note (Signed)
Chronic, no known etiology. Declines updating imaging of feet today. Avid runner and discussed that I suspect some degree of generative changes.Reasonable to trial mobic 7.'5mg'$  to see if helpful. Alternately, he will trial increase amitriptyline from '50mg'$  to '75mg'$  by splitting his tablet. He will let me know if helpful.  ?He will continue gabapentin '600mg'$  qam and '1200mg'$  qpm. ?

## 2022-03-10 NOTE — Progress Notes (Signed)
? ?Subjective:  ? ? Patient ID: Joshua Dominguez, male    DOB: July 16, 1941, 81 y.o.   MRN: [id number] ? ?CC: Joshua Dominguez is a 81 y.o. male who presents today for follow up, transfer of care.  ? ?HPI: Feels well today ?No new complaints ? ? ?He is not on crestor. He declines statin therapy ? ?Bilateral neuropathy is unchanged. Worse at night. Doesn't occur every night. ? ?Following with Dr Holley Raring for bilateral foot neuropathy, left plantar fascitis ?Stopped lyrica due to side effects ?Compliant with gabapentin '600mg'$  qam and '1200mg'$  qpm, Amitriptyline '50mg'$ .  ? ?Continue  ketamine, Flexeril ?Mobic wasn't helpful in the past however would be interested in trial again.  ? ?He is avid runner.  ?No h/o GIB ? ?HISTORY:  ?Past Medical History:  ?Diagnosis Date  ? Allergic rhinitis   ? Bilateral hydrocele   ? DDD (degenerative disc disease), lumbar   ?  L3-L4  ? ED (erectile dysfunction) of organic origin   ? Gout   ? Hemorrhoids   ? History of prostatitis   ? Lumbar herniated disc 2007  ? L4 and L5 did not require surgery  ? Prostate cancer ([place])   ? S/P  RADIOACTIVE SEED IMPLANTS 05-09-2013  ? ?Past Surgical History:  ?Procedure Laterality Date  ? APPENDECTOMY  FEB 2000  ? CATARACT EXTRACTION W/ INTRAOCULAR LENS  IMPLANT, BILATERAL    ? GASTROCNEMIUS RECESSION Left 06/09/2021  ? Procedure: Left gastroc recession;  Surgeon: Wylene Simmer, MD;  Location: [facility];  Service: Orthopedics;  Laterality: Left;  ? HYDROCELE EXCISION Bilateral 01/02/2014  ? Procedure: BILATERAL HYDROCELECTOMY;  Surgeon: Claybon Jabs, MD;  Location: [facility];  Service: Urology;  Laterality: Bilateral;  ? HYDROCELE EXCISION Left 04/17/2014  ? Procedure: LEFT HYDROCELECTOMY ADULT;  Surgeon: Claybon Jabs, MD;  Location[facility];  Service: Urology;  Laterality: Left;  ? KNEE ARTHROSCOPY Left   ? PLANTAR FASCIA RELEASE Left 06/09/2021  ? Procedure: Plantar fascia release;  Surgeon: Wylene Simmer, MD;   Location: [facility];  Service: Orthopedics;  Laterality: Left;  ? RADIOACTIVE SEED IMPLANT N/A 05/09/2013  ? Procedure: RADIOACTIVE SEED IMPLANT;  Surgeon: Claybon Jabs, MD;  Location: [facility];  Service: Urology;  Laterality: N/A;  ? ?Family History  ?Problem Relation Age of Onset  ? Hypertension Father   ? Gout Father   ? Prostate cancer Father   ? Lung cancer Mother   ?     +smoker  ? Lung cancer Maternal Grandfather   ?     -smoker  ? Heart disease Sister   ? Heart disease Brother   ? ? ?Allergies: Celebrex [celecoxib] and Oxymetazoline hcl ?Current Outpatient Medications on File Prior to Visit  ?Medication Sig Dispense Refill  ? amitriptyline (ELAVIL) 50 MG tablet TAKE 1 TABLET BY MOUTH DAILY AT BEDTIME 90 tablet 3  ? Febuxostat 80 MG TABS TAKE 1 TABLET(80 MG) BY MOUTH DAILY 90 tablet 1  ? gabapentin (NEURONTIN) 300 MG capsule TAKE 3 CAPSULES BY MOUTH EVERY MORNING AND TAKE 2 CAPSULES AT BEDTIME AS DIRECTED 450 capsule 2  ? glucosamine-chondroitin 500-400 MG tablet Take 1 tablet by mouth 2 (two) times daily.    ? metroNIDAZOLE (METROGEL) 1 % gel Apply topically at bedtime. Qhs to face for Rosacea 45 g 11  ? Multiple Vitamin (MULTIVITAMIN) tablet Take 1 tablet by mouth daily.    ? NONFORMULARY OR COMPOUNDED ITEM Apply 1-2  mLs topically 4 (four) times daily as needed (for pain). 1 each 2  ? Omega-3 Fatty Acids (FISH OIL) 1200 MG CAPS Take 1 capsule by mouth 2 (two) times daily.    ? ?No current facility-administered medications on file prior to visit.  ? ? ?Social History  ? ?Tobacco Use  ? Smoking status: Former  ?  Types: Pipe  ?  Quit date: 11/13/1976  ?  Years since quitting: 45.3  ? Smokeless tobacco: Never  ?Vaping Use  ? Vaping Use: Never used  ?Substance Use Topics  ? Alcohol use: Yes  ?  Alcohol/week: 14.0 standard drinks  ?  Types: 14 Glasses of wine per week  ? Drug use: No  ? ? ?Review of Systems  ?Constitutional:  Negative for chills and fever.  ?Respiratory:   Negative for cough.   ?Cardiovascular:  Negative for chest pain and palpitations.  ?Gastrointestinal:  Negative for nausea and vomiting.  ?Neurological:  Positive for numbness.  ?   ?Objective:  ?  ?BP 112/68 (BP Location: Left Arm, Patient Position: Sitting, Cuff Size: Large)   Pulse 74   Temp 98.5 ?F (36.9 ?C) (Oral)   Ht '6\' 5"'$  (1.956 m)   Wt 255 lb 12.8 oz (116 kg)   SpO2 96%   BMI 30.33 kg/m?  ?BP Readings from Last 3 Encounters:  ?03/10/22 112/68  ?10/27/21 132/87  ?08/24/21 124/82  ? ?Wt Readings from Last 3 Encounters:  ?03/10/22 255 lb 12.8 oz (116 kg)  ?10/27/21 255 lb (115.7 kg)  ?08/24/21 266 lb (120.7 kg)  ? ? ?Physical Exam ?Vitals reviewed.  ?Constitutional:   ?   Appearance: He is well-developed.  ?Cardiovascular:  ?   Rate and Rhythm: Regular rhythm.  ?   Heart sounds: Normal heart sounds.  ?Pulmonary:  ?   Effort: Pulmonary effort is normal. No respiratory distress.  ?   Breath sounds: Normal breath sounds. No wheezing, rhonchi or rales.  ?Skin: ?   General: Skin is warm and dry.  ?Neurological:  ?   Mental Status: He is alert.  ?Psychiatric:     ?   Speech: Speech normal.     ?   Behavior: Behavior normal.  ? ? ?   ?Assessment & Plan:  ? ?Problem List Items Addressed This Visit   ? ?  ? Cardiovascular and Mediastinum  ? Atherosclerosis of aorta ([place])  ?  Chronic, symptomatically stable. He declines statin therapy.  ? ?  ?  ?  ? Nervous and Auditory  ? Peripheral neuropathy - Primary  ?  Chronic, no known etiology. Declines updating imaging of feet today. Avid runner and discussed that I suspect some degree of generative changes.Reasonable to trial mobic 7.'5mg'$  to see if helpful. Alternately, he will trial increase amitriptyline from '50mg'$  to '75mg'$  by splitting his tablet. He will let me know if helpful.  ?He will continue gabapentin '600mg'$  qam and '1200mg'$  qpm. ? ?  ?  ? Relevant Medications  ? meloxicam (MOBIC) 7.5 MG tablet  ?  ? Other  ? HLD (hyperlipidemia)  ?  The 10-year ASCVD risk score  (Elasha Tess DK, et al., 2019) is: 26.1% ?Discussed ASCVD score and known atherosclerosis. He remains diligent to exercise and eating healthy. He declines statin therapy.  ? ?  ?  ? ?Other Visit Diagnoses   ? ? Impaired fasting glucose      ? ?  ? ? ? ?I have discontinued Gwyndolyn Saxon L. Tegethoff "Bill"'s colchicine and pregabalin. I am  also having him start on meloxicam. Additionally, I am having him maintain his Fish Oil, glucosamine-chondroitin, multivitamin, NONFORMULARY OR COMPOUNDED ITEM, Febuxostat, metroNIDAZOLE, gabapentin, and amitriptyline. ? ? ?Meds ordered this encounter  ?Medications  ? meloxicam (MOBIC) 7.5 MG tablet  ?  Sig: Take 1 tablet (7.5 mg total) by mouth daily as needed for pain.  ?  Dispense:  30 tablet  ?  Refill:  1  ?  Order Specific Question:   Supervising Provider  ?  Answer:   Crecencio Mc [[id number]]  ? ? ?Return precautions given.  ? ?Risks, benefits, and alternatives of the medications and treatment plan prescribed today were discussed, and patient expressed understanding.  ? ?Education regarding symptom management and diagnosis given to patient on AVS. ? ?Continue to follow with Burnard Hawthorne, FNP for routine health maintenance.  ? ?Janit Pagan and I agreed with plan.  ? ?Mable Paris, FNP ? ? ?

## 2022-03-22 ENCOUNTER — Encounter: Payer: Self-pay | Admitting: Family

## 2022-03-28 ENCOUNTER — Other Ambulatory Visit: Payer: Self-pay

## 2022-03-28 ENCOUNTER — Ambulatory Visit: Payer: PPO | Admitting: Dermatology

## 2022-03-28 DIAGNOSIS — E785 Hyperlipidemia, unspecified: Secondary | ICD-10-CM

## 2022-03-28 IMAGING — CT CT RENAL STONE PROTOCOL
2 of 4 series · 16 of 46 positions shown, 18 images · non-contrast
Comparison: None.

CLINICAL DATA: Left-sided flank pain with vomiting, initial
encounter

EXAM:
CT ABDOMEN AND PELVIS WITHOUT CONTRAST
TECHNIQUE: Multidetector CT imaging of the abdomen and pelvis was performed
following the standard protocol without IV contrast.

[Series 2: stone full standard · axial · 0.83mm/px · z∈[-594,-104]mm · 13 of 108 slices shown, 15 images]
[im 5/108  soft-tissue]
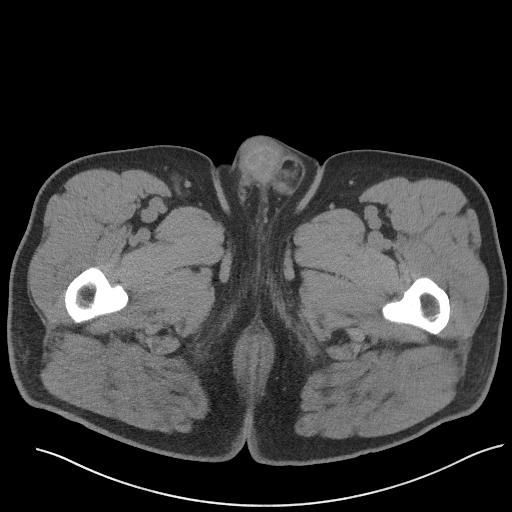
[im 5/108  bone]
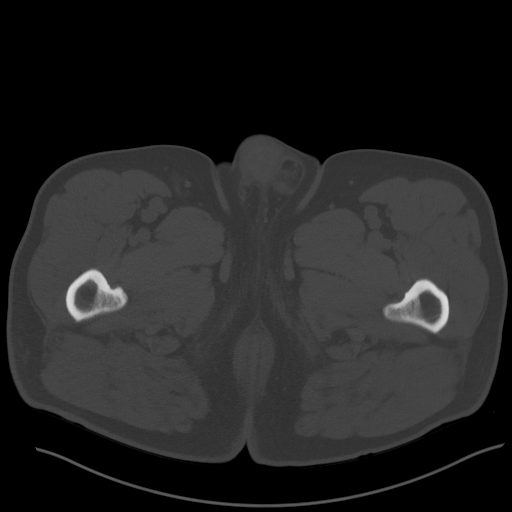
[im 14/108  soft-tissue]
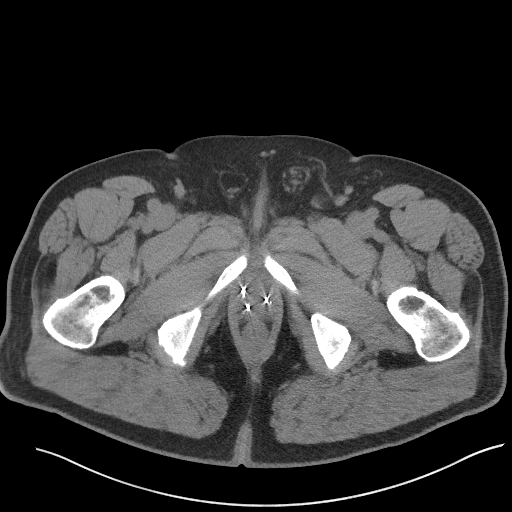
[im 24/108  soft-tissue]
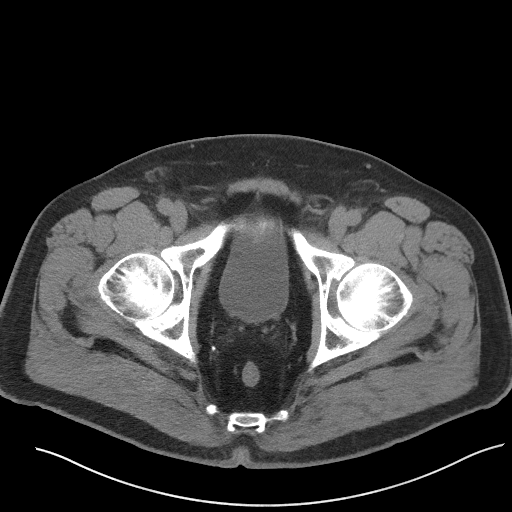
[im 28/108  soft-tissue]
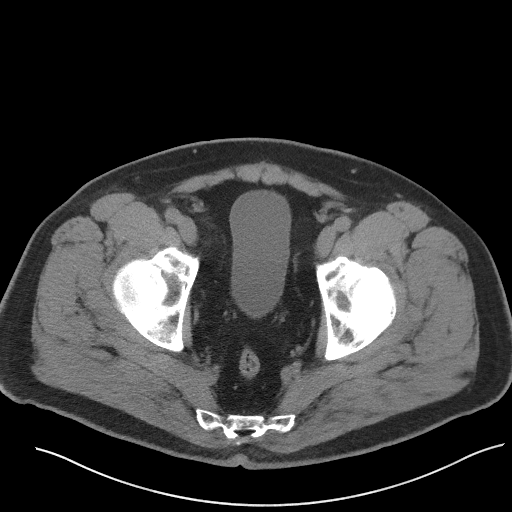
[im 38/108  soft-tissue]
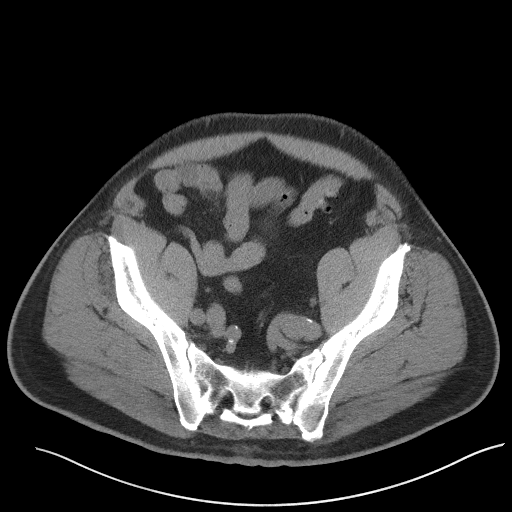
[im 47/108  soft-tissue]
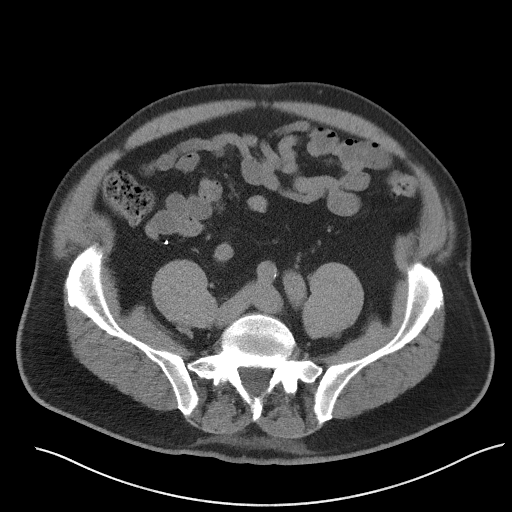
[im 56/108  soft-tissue]
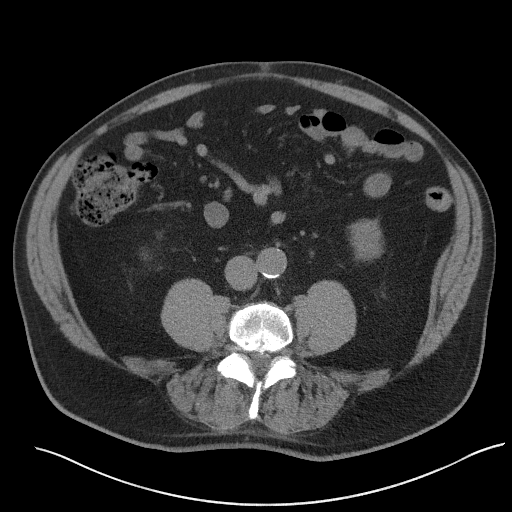
[im 61/108  soft-tissue]
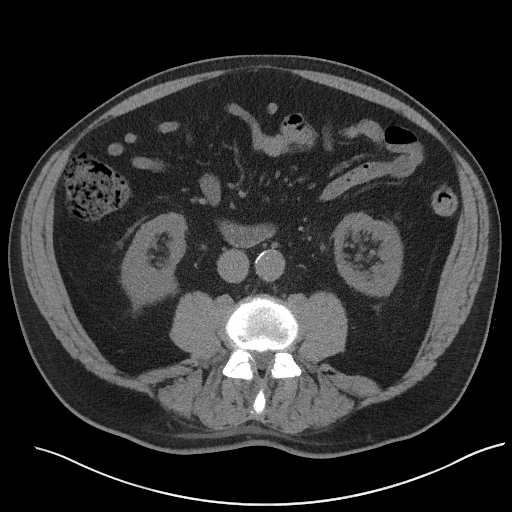
[im 70/108  soft-tissue]
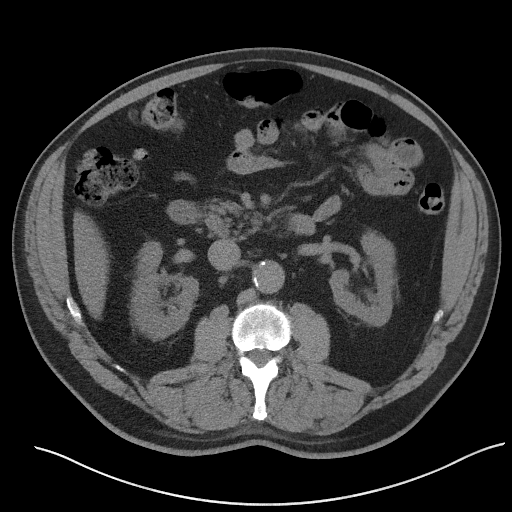
[im 70/108  bone]
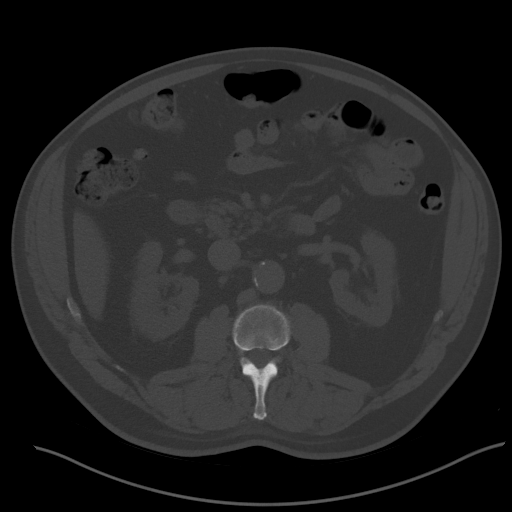
[im 80/108  soft-tissue]
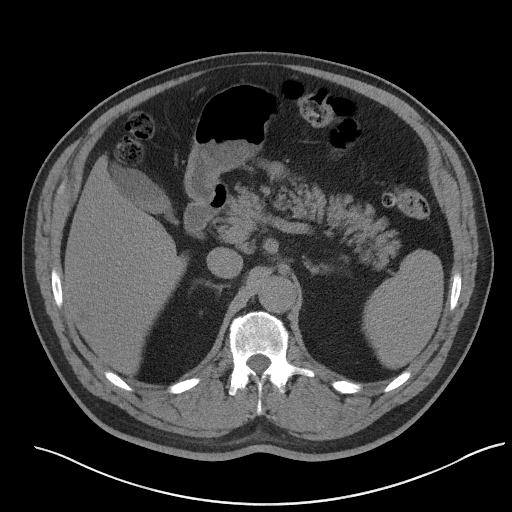
[im 84/108  soft-tissue]
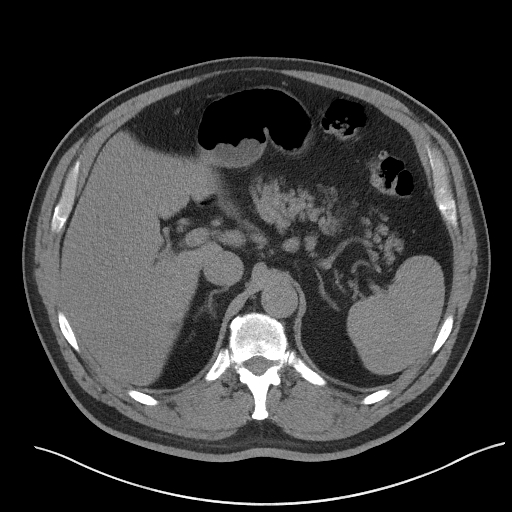
[im 94/108  soft-tissue]
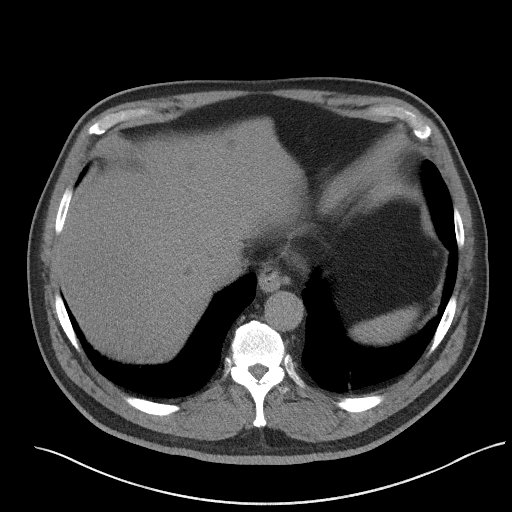
[im 103/108  soft-tissue]
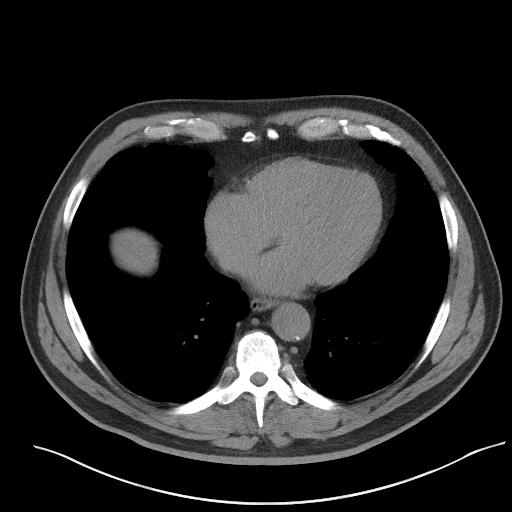

[Series 5: coronal · coronal · 0.97mm/px · 3 of 176 slices shown]
[im 59/176  soft-tissue]
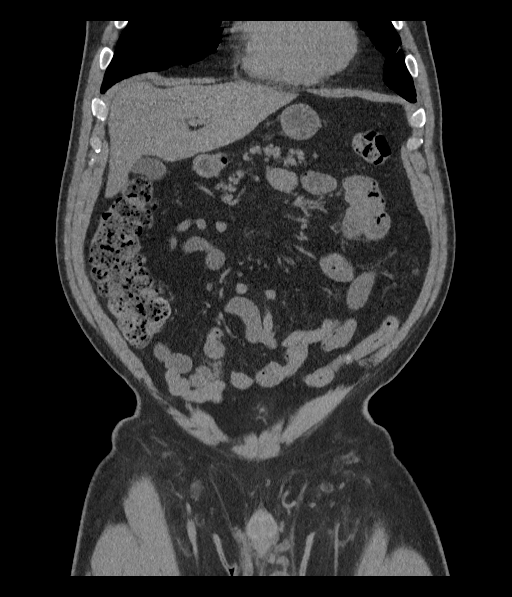
[im 78/176  soft-tissue]
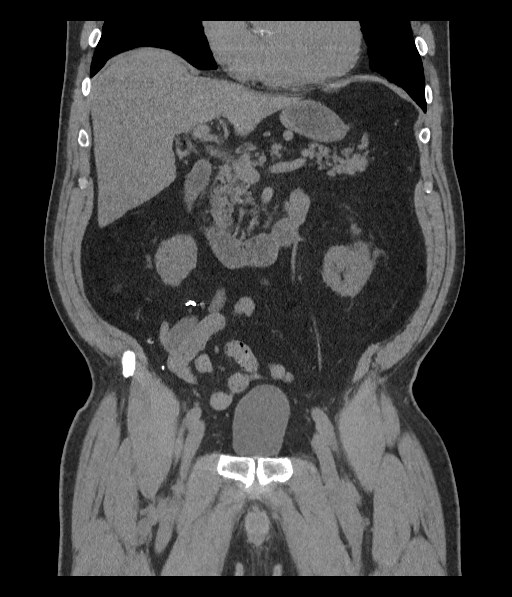
[im 98/176  soft-tissue]
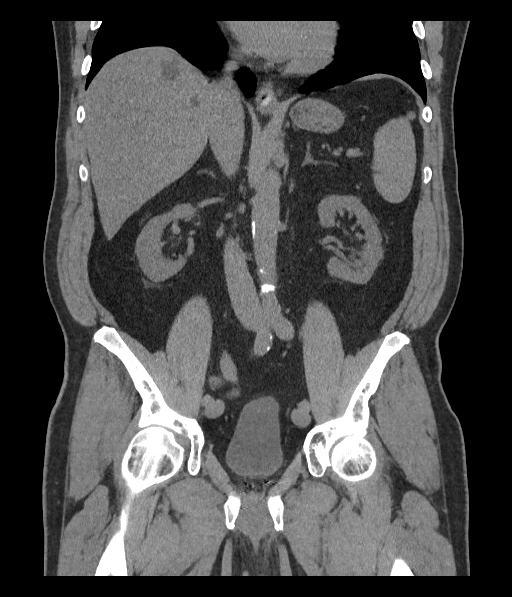

[16 of 46 positions shown; findings below may reference images not displayed]

FINDINGS: Lower chest: Mild left basilar scarring is seen.

Hepatobiliary: Gallbladder is within normal limits. Scattered
hypodensities are noted throughout the liver likely representing
cysts but incompletely evaluated on this exam. The largest of these
measures approximately 2.6 cm in the right lobe near the dome of the
diaphragm.

Pancreas: Unremarkable. No pancreatic ductal dilatation or
surrounding inflammatory changes.

Spleen: Normal in size without focal abnormality.

Adrenals/Urinary Tract: Adrenal glands are within normal limits.
Kidneys are well visualized bilaterally. No renal calculi are noted.
Hypodensity in the posterior aspect of the right kidney is noted
likely representing a cyst. It measures 2 cm in greatest length. No
ureteral obstructive changes are seen. The bladder is partially
distended.

Stomach/Bowel: Scattered diverticular change of the colon is noted
without evidence of diverticulitis. The appendix has been surgically
removed. Small bowel and stomach are within normal limits.

Vascular/Lymphatic: Aortic atherosclerosis. No enlarged abdominal or
pelvic lymph nodes.

Reproductive: Prostate shows treatment seeds in place.

Other: No abdominal wall hernia or abnormality. No abdominopelvic
ascites.

Musculoskeletal: No acute or significant osseous findings.
IMPRESSION: Diverticulosis without diverticulitis.

No renal calculi or obstructive changes are noted.

Hypodensities within the liver and kidneys likely representing
cysts.

## 2022-03-28 NOTE — Telephone Encounter (Signed)
Spoke to patient and informed him of the Rosuvatatin that was sent in and scheduled him an appointment for labs in 6 weeks. He has physical in Oct and he stated that he will get labs done at that time because that is in the time frame of 4-6 months ?

## 2022-04-05 ENCOUNTER — Encounter: Payer: Self-pay | Admitting: Family

## 2022-04-07 ENCOUNTER — Other Ambulatory Visit: Payer: Self-pay | Admitting: Family

## 2022-04-07 DIAGNOSIS — I700 Atherosclerosis of aorta: Secondary | ICD-10-CM

## 2022-04-12 ENCOUNTER — Other Ambulatory Visit: Payer: Self-pay

## 2022-04-12 DIAGNOSIS — E785 Hyperlipidemia, unspecified: Secondary | ICD-10-CM

## 2022-04-12 MED ORDER — ROSUVASTATIN CALCIUM 5 MG PO TABS: 5.0000 mg | ORAL_TABLET | Freq: Every day | ORAL | 3 refills | Status: DC

## 2022-05-01 ENCOUNTER — Other Ambulatory Visit: Payer: PPO

## 2022-05-10 ENCOUNTER — Telehealth: Payer: Self-pay | Admitting: Family

## 2022-05-10 NOTE — Telephone Encounter (Signed)
Copied from [place] [id number]. Topic: Medicare AWV >> May 10, 2022 10:57 AM Devoria Glassing wrote: Reason for CRM: Left message for patient to schedule Annual Wellness Visit.  Please schedule with Nurse Health Advisor Denisa O'Brien-Blaney, LPN at [facility].  Please call [phone] ask for [facility]

## 2022-05-24 ENCOUNTER — Other Ambulatory Visit (INDEPENDENT_AMBULATORY_CARE_PROVIDER_SITE_OTHER): Payer: PPO

## 2022-05-24 DIAGNOSIS — E785 Hyperlipidemia, unspecified: Secondary | ICD-10-CM

## 2022-05-24 LAB — COMPREHENSIVE METABOLIC PANEL
ALT: 26 U/L (ref 0–53)
AST: 28 U/L (ref 0–37)
Albumin: 4.3 g/dL (ref 3.5–5.2)
Alkaline Phosphatase: 63 U/L (ref 39–117)
BUN: 28 mg/dL — ABNORMAL HIGH (ref 6–23)
CO2: 29 mEq/L (ref 19–32)
Calcium: 9.2 mg/dL (ref 8.4–10.5)
Chloride: 106 mEq/L (ref 96–112)
Creatinine, Ser: 1.21 mg/dL (ref 0.40–1.50)
GFR: 57.14 mL/min — ABNORMAL LOW (ref >60.00)
Glucose, Bld: 90 mg/dL (ref 70–99)
Potassium: 4.5 mEq/L (ref 3.5–5.1)
Sodium: 140 mEq/L (ref 135–145)
Total Bilirubin: 0.6 mg/dL (ref 0.2–1.2)
Total Protein: 6.6 g/dL (ref 6.0–8.3)

## 2022-06-20 ENCOUNTER — Telehealth: Payer: Self-pay | Admitting: Family

## 2022-06-20 NOTE — Telephone Encounter (Signed)
Pt came into office stating he need the provider to write him a prescription for his cpap supplies-nasal cpap mask,cushions, filter, heated tubing, humidifier chamber diagnosis code. Pt dropped off paper with instructions placed in provider folder

## 2022-06-21 ENCOUNTER — Telehealth: Payer: Self-pay | Admitting: Family

## 2022-06-21 NOTE — Telephone Encounter (Signed)
Copied from [place] [id number]. Topic: Medicare AWV >> Jun 21, 2022  9:36 AM Devoria Glassing wrote: Reason for CRM: Left message for patient to schedule Annual Wellness Visit.  Please schedule with Nurse Health Advisor Denisa O'Brien-Blaney, LPN at [facility]. This appt can be telephone or office visit.  Please call [phone] ask for [facility]

## 2022-06-23 ENCOUNTER — Other Ambulatory Visit: Payer: Self-pay | Admitting: Family

## 2022-06-23 DIAGNOSIS — G4733 Obstructive sleep apnea (adult) (pediatric): Secondary | ICD-10-CM

## 2022-06-23 NOTE — Telephone Encounter (Signed)
Order faxed on 06/23/22 Patient was notified

## 2022-06-23 NOTE — Telephone Encounter (Signed)
DME order on your desk  Please fax and call pt to let him that we have done so

## 2022-07-19 ENCOUNTER — Ambulatory Visit: Payer: PPO | Admitting: Dermatology

## 2022-07-19 DIAGNOSIS — L570 Actinic keratosis: Secondary | ICD-10-CM | POA: Diagnosis not present

## 2022-07-19 DIAGNOSIS — L719 Rosacea, unspecified: Secondary | ICD-10-CM | POA: Diagnosis not present

## 2022-07-19 DIAGNOSIS — L578 Other skin changes due to chronic exposure to nonionizing radiation: Secondary | ICD-10-CM | POA: Diagnosis not present

## 2022-07-19 NOTE — Patient Instructions (Addendum)
Rosacea - continue Metrogel to nose and forehead once a day. If forehead not improving with Metrogel after a few weeks, may start 5-fluorouracil/calcipotriene cream.  Cryotherapy Aftercare  Wash gently with soap and water everyday.   Apply Vaseline and Band-Aid daily until healed.   Once healed from cryotherapy treatment, start 5-Fluorouracil/Calcipotriene.  5-Fluorouracil/Calcipotriene Patient Education   Actinic keratoses are the dry, red scaly spots on the skin caused by sun damage. A portion of these spots can turn into skin cancer with time, and treating them can help prevent development of skin cancer.   Treatment of these spots requires removal of the defective skin cells. There are various ways to remove actinic keratoses, including freezing with liquid nitrogen, treatment with creams, or treatment with a blue light procedure in the office.   5-fluorouracil cream is a topical cream used to treat actinic keratoses. It works by interfering with the growth of abnormal fast-growing skin cells, such as actinic keratoses. These cells peel off and are replaced by healthy ones.   5-fluorouracil/calcipotriene is a combination of the 5-fluorouracil cream with a vitamin D analog cream called calcipotriene. The calcipotriene alone does not treat actinic keratoses. However, when it is combined with 5-fluorouracil, it helps the 5-fluorouracil treat the actinic keratoses much faster so that the same results can be achieved with a much shorter treatment time.  INSTRUCTIONS FOR 5-FLUOROURACIL/CALCIPOTRIENE CREAM:   5-fluorouracil/calcipotriene cream typically only needs to be used for 4-7 days. A thin layer should be applied twice a day to the treatment areas (nose) recommended by your physician.   If your physician prescribed you separate tubes of 5-fluourouracil and calcipotriene, apply a thin layer of 5-fluorouracil followed by a thin layer of calcipotriene.   Avoid contact with your eyes,  nostrils, and mouth. Do not use 5-fluorouracil/calcipotriene cream on infected or open wounds.   You will develop redness, irritation and some crusting at areas where you have pre-cancer damage/actinic keratoses. IF YOU DEVELOP PAIN, BLEEDING, OR SIGNIFICANT CRUSTING, STOP THE TREATMENT EARLY - you have already gotten a good response and the actinic keratoses should clear up well.  Wash your hands after applying 5-fluorouracil 5% cream on your skin.   A moisturizer or sunscreen with a minimum SPF 30 should be applied each morning.   Once you have finished the treatment, you can apply a thin layer of Vaseline twice a day to irritated areas to soothe and calm the areas more quickly. If you experience significant discomfort, contact your physician.  For some patients it is necessary to repeat the treatment for best results.  SIDE EFFECTS: When using 5-fluorouracil/calcipotriene cream, you may have mild irritation, such as redness, dryness, swelling, or a mild burning sensation. This usually resolves within 2 weeks. The more actinic keratoses you have, the more redness and inflammation you can expect during treatment. Eye irritation has been reported rarely. If this occurs, please let us know.  If you have any trouble using this cream, please call the office. If you have any other questions about this information, please do not hesitate to ask me before you leave the office.  Instructions for [facility] Medications  One or more of your medications was sent to the [facility] mail order compounding pharmacy. You will receive an email from them and can purchase the medicine through that link. It will then be mailed to your home at the address you confirmed. If for any reason you do not receive an email from them, please check your spam folder.  If you still do not find the email, please let us know. [facility] phone number is [phone].   Due to recent changes in healthcare laws, you may  see results of your pathology and/or laboratory studies on MyChart before the doctors have had a chance to review them. We understand that in some cases there may be results that are confusing or concerning to you. Please understand that not all results are received at the same time and often the doctors may need to interpret multiple results in order to provide you with the best plan of care or course of treatment. Therefore, we ask that you please give [place] 2 business days to thoroughly review all your results before contacting the office for clarification. Should we see a critical lab result, you will be contacted sooner.   If You Need Anything After Your Visit  If you have any questions or concerns for your doctor, please call our main line at [phone] and press option 4 to reach your doctor's medical assistant. If no one answers, please leave a voicemail as directed and we will return your call as soon as possible. Messages left after 4 pm will be answered the following business day.   You may also send [place] a message via [place]. We typically respond to MyChart messages within 1-2 business days.  For prescription refills, please ask your pharmacy to contact our office. Our fax number is [phone].  If you have an urgent issue when the clinic is closed that cannot wait until the next business day, you can page your doctor at the number below.    Please note that while we do our best to be available for urgent issues outside of office hours, we are not available 24/7.   If you have an urgent issue and are unable to reach [place], you may choose to seek medical care at your doctor's office, retail clinic, urgent care center, or emergency room.  If you have a medical emergency, please immediately call 911 or go to the emergency department.  Pager Numbers  - Dr. Nehemiah Massed: [phone]  - Dr. Laurence Ferrari: [phone]  - Dr. Nicole Kindred: [phone]  In the event of inclement weather, please call our  main line at [phone] for an update on the status of any delays or closures.  Dermatology Medication Tips: Please keep the boxes that topical medications come in in order to help keep track of the instructions about where and how to use these. Pharmacies typically print the medication instructions only on the boxes and not directly on the medication tubes.   If your medication is too expensive, please contact our office at [phone] option 4 or send [place] a message through [place].   We are unable to tell what your co-pay for medications will be in advance as this is different depending on your insurance coverage. However, we may be able to find a substitute medication at lower cost or fill out paperwork to get insurance to cover a needed medication.   If a prior authorization is required to get your medication covered by your insurance company, please allow [place] 1-2 business days to complete this process.  Drug prices often vary depending on where the prescription is filled and some pharmacies may offer cheaper prices.  The website www.goodrx.com contains coupons for medications through different pharmacies. The prices here do not account for what the cost may be with help from insurance (it may be cheaper with your insurance), but the website can give you  the price if you did not use any insurance.  - You can print the associated coupon and take it with your prescription to the pharmacy.  - You may also stop by our office during regular business hours and pick up a GoodRx coupon card.  - If you need your prescription sent electronically to a different pharmacy, notify our office through [facility] or by phone at [phone] option 4.     Si Usted Necesita Algo Despus de Su Visita  Tambin puede enviarnos un mensaje a travs de Pharmacist, community. Por lo general respondemos a los mensajes de MyChart en el transcurso de 1 a 2 das hbiles.  Para renovar recetas, por favor pida a su  farmacia que se ponga en contacto con nuestra oficina. Harland Dingwall de fax es [place] [phone].  Si tiene un asunto urgente cuando la clnica est cerrada y que no puede esperar hasta el siguiente da hbil, puede llamar/localizar a su doctor(a) al nmero que aparece a continuacin.   Por favor, tenga en cuenta que aunque hacemos todo lo posible para estar disponibles para asuntos urgentes fuera del horario de [place], no estamos disponibles las 24 horas del da, los 7 das de la [place].   Si tiene un problema urgente y no puede comunicarse con nosotros, puede optar por buscar atencin mdica  en el consultorio de su doctor(a), en una clnica privada, en un centro de atencin urgente o en una sala de emergencias.  Si tiene Engineering geologist, por favor llame inmediatamente al 911 o vaya a la sala de emergencias.  Nmeros de bper  - Dr. Nehemiah Massed: [phone]  - Dra. Moye: [phone]  - Dra. Nicole Kindred: [phone]  En caso de inclemencias del [place], por favor llame a Johnsie Kindred principal al [phone] para una actualizacin sobre el [place] de cualquier retraso o cierre.  Consejos para la medicacin en dermatologa: Por favor, guarde las cajas en las que vienen los medicamentos de uso tpico para ayudarle a seguir las instrucciones sobre dnde y cmo usarlos. Las farmacias generalmente imprimen las instrucciones del medicamento slo en las cajas y no directamente en los tubos del [place].   Si su medicamento es muy caro, por favor, pngase en contacto con Zigmund Daniel llamando al [phone] y presione la opcin 4 o envenos un mensaje a travs de Pharmacist, community.   No podemos decirle cul ser su copago por los medicamentos por adelantado ya que esto es diferente dependiendo de la cobertura de su seguro. Sin embargo, es posible que podamos encontrar un medicamento sustituto a Electrical engineer un formulario para que el seguro cubra el medicamento que se considera necesario.    Si se requiere una autorizacin previa para que su compaa de seguros [place] su medicamento, por favor permtanos de 1 a 2 das hbiles para completar este proceso.  Los precios de los medicamentos varan con frecuencia dependiendo del Environmental consultant de dnde se surte la receta y alguna farmacias pueden ofrecer precios ms baratos.  El sitio web www.goodrx.com tiene cupones para medicamentos de Airline pilot. Los precios aqu no tienen en cuenta lo que podra costar con la ayuda del seguro (puede ser ms barato con su seguro), pero el sitio web puede darle el precio si no utiliz Research scientist (physical sciences).  - Puede imprimir el cupn correspondiente y llevarlo con su receta a la farmacia.  - Tambin puede pasar por nuestra oficina durante el horario de atencin regular y Charity fundraiser una tarjeta de cupones de GoodRx.  - Si necesita  que su receta se enve electrnicamente a Chiropodist, informe a nuestra oficina a travs de MyChart de [place] o por telfono llamando al [phone] y presione la opcin 4.

## 2022-07-19 NOTE — Progress Notes (Signed)
Follow-Up Visit   Subjective  Joshua Dominguez is a 81 y.o. male who presents for the following: Follow-up.  Patient presents for 6 month follow-up Aks of the nose. Patient also has rosacea and treats with Metrogel 1%.   The following portions of the chart were reviewed this encounter and updated as appropriate:       Review of Systems:  No other skin or systemic complaints except as noted in HPI or Assessment and Plan.  Objective  Well appearing patient in no apparent distress; mood and affect are within normal limits.  A focused examination was performed including face. Relevant physical exam findings are noted in the Assessment and Plan.  face Small pink papules on the upper forehead  L nasal dorsum x 1, upper nasal dorsum x 1 (2) Pink scaly macules.    Assessment & Plan  Actinic Damage - Severe, confluent actinic changes with pre-cancerous actinic keratoses  - Severe, chronic, not at goal, secondary to cumulative UV radiation exposure over time - diffuse scaly erythematous macules and papules with underlying dyspigmentation - Discussed Prescription "Field Treatment" for Severe, Chronic Confluent Actinic Changes with Pre-Cancerous Actinic Keratoses Field treatment involves treatment of an entire area of skin that has confluent Actinic Changes (Sun/ Ultraviolet light damage) and PreCancerous Actinic Keratoses by method of PhotoDynamic Therapy (PDT) and/or prescription Topical Chemotherapy agents such as 5-fluorouracil, 5-fluorouracil/calcipotriene, and/or imiquimod.  The purpose is to decrease the number of clinically evident and subclinical PreCancerous lesions to prevent progression to development of skin cancer by chemically destroying early precancer changes that may or may not be visible.  It has been shown to reduce the risk of developing skin cancer in the treated area. As a result of treatment, redness, scaling, crusting, and open sores may occur during treatment course. One  or more than one of these methods may be used and may have to be used several times to control, suppress and eliminate the PreCancerous changes. Discussed treatment course, expected reaction, and possible side effects. - Recommend daily broad spectrum sunscreen SPF 30+ to sun-exposed areas, reapply every 2 hours as needed.  - Staying in the shade or wearing long sleeves, sun glasses (UVA+UVB protection) and wide brim hats (4-inch brim around the entire circumference of the hat) are also recommended. - Call for new or changing lesions. - pt will start 5FU/VitD cream to nose bid x 4-7 days.  Sent to [facility]  Rosacea face  Chronic and persistent condition with duration or expected duration over one year. Condition is symptomatic/ bothersome to patient. Not currently at goal. Pt is improved at nose, but has had recent flare on forehead.  Has not applied metrogel yet to forehead.  Rosacea is a chronic progressive skin condition usually affecting the face of adults, causing redness and/or acne bumps. It is treatable but not curable. It sometimes affects the eyes (ocular rosacea) as well. It may respond to topical and/or systemic medication and can flare with stress, sun exposure, alcohol, exercise and some foods.  Daily application of broad spectrum spf 30+ sunscreen to face is recommended to reduce flares.  Continue Metrogel 1% QD aas face. Pt has refills.   Related Medications metroNIDAZOLE (METROGEL) 1 % gel Apply topically at bedtime. Qhs to face for Rosacea  AK (actinic keratosis) (2) L nasal dorsum x 1, upper nasal dorsum x 1  Actinic keratoses are precancerous spots that appear secondary to cumulative UV radiation exposure/sun exposure over time. They are chronic with expected duration over  1 year. A portion of actinic keratoses will progress to squamous cell carcinoma of the skin. It is not possible to reliably predict which spots will progress to skin cancer and so treatment is  recommended to prevent development of skin cancer.  Recommend daily broad spectrum sunscreen SPF 30+ to sun-exposed areas, reapply every 2 hours as needed.  Recommend staying in the shade or wearing long sleeves, sun glasses (UVA+UVB protection) and wide brim hats (4-inch brim around the entire circumference of the hat). Call for new or changing lesions.  Once areas healed from cryotherapy, start 5-fluorouracil/calcipotriene cream twice a day for 4-7 days to affected areas including nose. Rx called into [facility] [place]. Left message advising patient Rx was put on hold and he can call to have filled, $49 for 15g. Patient to call office if he would rather have Rx sent to [facility] instead.   5-fluorouracil/calcipotriene cream is is a type of field treatment used to treat precancers, thin skin cancers, and areas of sun damage. Expected reaction includes irritation and mild inflammation potentially progressing to more severe inflammation including redness, scaling, crusting and open sores/erosions.  If too much irritation occurs, ensure application of only a thin layer and decrease frequency of use to achieve a tolerable level of inflammation. Recommend applying Vaseline ointment to open sores as needed.  Minimize sun exposure while under treatment. Recommend daily broad spectrum sunscreen SPF 30+ to sun-exposed areas, reapply every 2 hours as needed.       Destruction of lesion - L nasal dorsum x 1, upper nasal dorsum x 1  Destruction method: cryotherapy   Informed consent: discussed and consent obtained   Lesion destroyed using liquid nitrogen: Yes   Region frozen until ice ball extended beyond lesion: Yes   Outcome: patient tolerated procedure well with no complications   Post-procedure details: wound care instructions given   Additional details:  Prior to procedure, discussed risks of blister formation, small wound, skin dyspigmentation, or rare scar following cryotherapy. Recommend  Vaseline ointment to treated areas while healing.    Return in about 6 months (around 01/17/2023).  IJamesetta [place], CMA, am acting as scribe for Brendolyn Patty, MD .  Documentation: I have reviewed the above documentation for accuracy and completeness, and I agree with the above.  Brendolyn Patty MD

## 2022-07-25 ENCOUNTER — Telehealth: Payer: Self-pay | Admitting: Family

## 2022-07-25 NOTE — Telephone Encounter (Signed)
Copied from [place] [id number]. Topic: Medicare AWV >> Jul 25, 2022 11:52 AM Devoria Glassing wrote: Reason for CRM: Left message for patient to schedule Annual Wellness Visit.  Please schedule with Nurse Health Advisor Denisa O'Brien-Blaney, LPN at [facility]. This appt can be telephone or office visit.  Please call [phone] ask for [facility]

## 2022-08-08 ENCOUNTER — Telehealth: Payer: Self-pay

## 2022-08-08 NOTE — Telephone Encounter (Signed)
This patient came in today confused as to where his Medication (5FU/Calcipotriene) was sent in to. Office visit note said that it was being sent to [facility] but patient changed his mind and wanted it [facility] to [facility] so that he didn't have to drive all the way to [place]. Sent into [facility].

## 2022-08-16 ENCOUNTER — Ambulatory Visit (INDEPENDENT_AMBULATORY_CARE_PROVIDER_SITE_OTHER): Payer: PPO

## 2022-08-16 VITALS — Ht 77.0 in | Wt 246.0 lb

## 2022-08-16 DIAGNOSIS — Z0000 Encounter for general adult medical examination without abnormal findings: Secondary | ICD-10-CM

## 2022-08-16 NOTE — Patient Instructions (Addendum)
Joshua Dominguez , Thank you for taking time to come for your Medicare Wellness Visit. I appreciate your ongoing commitment to your health goals. Please review the following plan we discussed and let me know if I can assist you in the future.   These are the goals we discussed:  Goals       Patient Stated     Weight (lb) < 236 lb (107 kg) (pt-stated)      Weight goal 235lb        This is a list of the screening recommended for you and due dates:  Health Maintenance  Topic Date Due   Zoster (Shingles) Vaccine (2 of 2) 10/13/2022*   COVID-19 Vaccine (6 - Moderna risk series) 10/10/2022   Tetanus Vaccine  01/31/2026   Pneumonia Vaccine  Completed   Flu Shot  Completed   HPV Vaccine  Aged Out   Hepatitis C Screening: USPSTF Recommendation to screen - Ages 18-79 yo.  Discontinued   Cologuard (Stool DNA test)  Discontinued  *Topic was postponed. The date shown is not the original due date.    Advanced directives: End of life planning; Advance aging; Advanced directives discussed.  Copy of current HCPOA/Living Will requested.    Conditions/risks identified: none new.   Next appointment: Follow up in one year for your annual wellness visit.   Preventive Care 79 Years and Older, Male  Preventive care refers to lifestyle choices and visits with your health care provider that can promote health and wellness. What does preventive care include? A yearly physical exam. This is also called an annual well check. Dental exams once or twice a year. Routine eye exams. Ask your health care provider how often you should have your eyes checked. Personal lifestyle choices, including: Daily care of your teeth and gums. Regular physical activity. Eating a healthy diet. Avoiding tobacco and drug use. Limiting alcohol use. Practicing safe sex. Taking low doses of aspirin every day. Taking vitamin and mineral supplements as recommended by your health care provider. What happens during an annual well  check? The services and screenings done by your health care provider during your annual well check will depend on your age, overall health, lifestyle risk factors, and family history of disease. Counseling  Your health care provider may ask you questions about your: Alcohol use. Tobacco use. Drug use. Emotional well-being. Home and relationship well-being. Sexual activity. Eating habits. History of falls. Memory and ability to understand (cognition). Work and work Statistician. Screening  You may have the following tests or measurements: Height, weight, and BMI. Blood pressure. Lipid and cholesterol levels. These may be checked every 5 years, or more frequently if you are over 62 years old. Skin check. Lung cancer screening. You may have this screening every year starting at age 71 if you have a 30-pack-year history of smoking and currently smoke or have quit within the past 15 years. Fecal occult blood test (FOBT) of the stool. You may have this test every year starting at age 8. Flexible sigmoidoscopy or colonoscopy. You may have a sigmoidoscopy every 5 years or a colonoscopy every 10 years starting at age 36. Prostate cancer screening. Recommendations will vary depending on your family history and other risks. Hepatitis C blood test. Hepatitis B blood test. Sexually transmitted disease (STD) testing. Diabetes screening. This is done by checking your blood sugar (glucose) after you have not eaten for a while (fasting). You may have this done every 1-3 years. Abdominal aortic aneurysm (AAA) screening. You  may need this if you are a current or former smoker. Osteoporosis. You may be screened starting at age 57 if you are at high risk. Talk with your health care provider about your test results, treatment options, and if necessary, the need for more tests. Vaccines  Your health care provider may recommend certain vaccines, such as: Influenza vaccine. This is recommended every  year. Tetanus, diphtheria, and acellular pertussis (Tdap, Td) vaccine. You may need a Td booster every 10 years. Zoster vaccine. You may need this after age 79. Pneumococcal 13-valent conjugate (PCV13) vaccine. One dose is recommended after age 79. Pneumococcal polysaccharide (PPSV23) vaccine. One dose is recommended after age 79. Talk to your health care provider about which screenings and vaccines you need and how often you need them. This information is not intended to replace advice given to you by your health care provider. Make sure you discuss any questions you have with your health care provider. Document Released: 11/26/2015 Document Revised: 07/19/2016 Document Reviewed: 08/31/2015 Elsevier Interactive Patient Education  2017 [place] Prevention in the Home Falls can cause injuries. They can happen to people of all ages. There are many things you can do to make your home safe and to help prevent falls. What can I do on the outside of my home? Regularly fix the edges of walkways and driveways and fix any cracks. Remove anything that might make you trip as you walk through a door, such as a raised step or threshold. Trim any bushes or trees on the path to your home. Use bright outdoor lighting. Clear any walking paths of anything that might make someone trip, such as rocks or tools. Regularly check to see if handrails are loose or broken. Make sure that both sides of any steps have handrails. Any raised decks and porches should have guardrails on the edges. Have any leaves, snow, or ice cleared regularly. Use sand or salt on walking paths during winter. Clean up any spills in your garage right away. This includes oil or grease spills. What can I do in the bathroom? Use night lights. Install grab bars by the toilet and in the tub and shower. Do not use towel bars as grab bars. Use non-skid mats or decals in the tub or shower. If you need to sit down in the shower, use a  plastic, non-slip stool. Keep the floor dry. Clean up any water that spills on the floor as soon as it happens. Remove soap buildup in the tub or shower regularly. Attach bath mats securely with double-sided non-slip rug tape. Do not have throw rugs and other things on the floor that can make you trip. What can I do in the bedroom? Use night lights. Make sure that you have a light by your bed that is easy to reach. Do not use any sheets or blankets that are too big for your bed. They should not hang down onto the floor. Have a firm chair that has side arms. You can use this for support while you get dressed. Do not have throw rugs and other things on the floor that can make you trip. What can I do in the kitchen? Clean up any spills right away. Avoid walking on wet floors. Keep items that you use a lot in easy-to-reach places. If you need to reach something above you, use a strong step stool that has a grab bar. Keep electrical cords out of the way. Do not use floor polish or wax that  makes floors slippery. If you must use wax, use non-skid floor wax. Do not have throw rugs and other things on the floor that can make you trip. What can I do with my stairs? Do not leave any items on the stairs. Make sure that there are handrails on both sides of the stairs and use them. Fix handrails that are broken or loose. Make sure that handrails are as long as the stairways. Check any carpeting to make sure that it is firmly attached to the stairs. Fix any carpet that is loose or worn. Avoid having throw rugs at the top or bottom of the stairs. If you do have throw rugs, attach them to the floor with carpet tape. Make sure that you have a light switch at the top of the stairs and the bottom of the stairs. If you do not have them, ask someone to add them for you. What else can I do to help prevent falls? Wear shoes that: Do not have high heels. Have rubber bottoms. Are comfortable and fit you  well. Are closed at the toe. Do not wear sandals. If you use a stepladder: Make sure that it is fully opened. Do not climb a closed stepladder. Make sure that both sides of the stepladder are locked into place. Ask someone to hold it for you, if possible. Clearly mark and make sure that you can see: Any grab bars or handrails. First and last steps. Where the edge of each step is. Use tools that help you move around (mobility aids) if they are needed. These include: Canes. Walkers. Scooters. Crutches. Turn on the lights when you go into a dark area. Replace any light bulbs as soon as they burn out. Set up your furniture so you have a clear path. Avoid moving your furniture around. If any of your floors are uneven, fix them. If there are any pets around you, be aware of where they are. Review your medicines with your doctor. Some medicines can make you feel dizzy. This can increase your chance of falling. Ask your doctor what other things that you can do to help prevent falls. This information is not intended to replace advice given to you by your health care provider. Make sure you discuss any questions you have with your health care provider. Document Released: 08/26/2009 Document Revised: 04/06/2016 Document Reviewed: 12/04/2014 Elsevier Interactive Patient Education  2017 [employer].

## 2022-08-16 NOTE — Progress Notes (Signed)
Subjective:   Joshua Dominguez is a 81 y.o. male who presents for Medicare Annual/Subsequent preventive examination.  Review of Systems    No ROS.  Medicare Wellness Virtual Visit.  Visual/audio telehealth visit, UTA vital signs.   See social history for additional risk factors.   Cardiac Risk Factors include: advanced age (>40mn, >>6women);male gender     Objective:    Today's Vitals   08/16/22 1309  Weight: 246 lb (111.6 kg)  Height: '6\' 5"'$  (1.956 m)   Body mass index is 29.17 kg/m.     08/16/2022    1:08 PM 06/09/2021    6:58 AM 05/31/2021   11:48 AM 02/20/2019    9:35 AM 04/17/2014    7:45 AM 04/14/2014    2:11 PM 01/02/2014    6:26 AM  Advanced Directives  Does Patient Have a Medical Advance Directive? Yes No No Yes Patient has advance directive, copy not in chart Patient has advance directive, copy not in chart Patient has advance directive, copy not in chart  Type of Advance [place]ive [place]    [place] will [place] will [place] will  Does patient want to make changes to medical advance directive? No - Patient declined   No - Patient declined No change requested    Copy of [place] Chart? No - copy requested    Copy requested from family  Copy requested from family  Would patient like information on creating a medical advance directive?  No - Patient declined  No - Patient declined       Current Medications (verified) Outpatient Encounter Medications as of 08/16/2022  Medication Sig   amitriptyline (ELAVIL) 50 MG tablet TAKE 1 TABLET BY MOUTH DAILY AT BEDTIME   Febuxostat 80 MG TABS TAKE 1 TABLET(80 MG) BY MOUTH DAILY   gabapentin (NEURONTIN) 300 MG capsule TAKE 3 CAPSULES BY MOUTH EVERY MORNING AND TAKE 2 CAPSULES AT BEDTIME AS DIRECTED   glucosamine-chondroitin 500-400 MG tablet Take 1 tablet by mouth 2 (two) times daily.    meloxicam (MOBIC) 7.5 MG tablet Take 1 tablet (7.5 mg total) by mouth daily as needed for pain.   metroNIDAZOLE (METROGEL) 1 % gel Apply topically at bedtime. Qhs to face for Rosacea   Multiple Vitamin (MULTIVITAMIN) tablet Take 1 tablet by mouth daily.   Omega-3 Fatty Acids (FISH OIL) 1200 MG CAPS Take 1 capsule by mouth 2 (two) times daily.   rosuvastatin (CRESTOR) 5 MG tablet Take 1 tablet (5 mg total) by mouth daily.   No facility-administered encounter medications on file as of 08/16/2022.    Allergies (verified) Celebrex [celecoxib] and Oxymetazoline hcl   History: Past Medical History:  Diagnosis Date   Allergic rhinitis    Bilateral hydrocele    DDD (degenerative disc disease), lumbar     L3-L4   ED (erectile dysfunction) of organic origin    Gout    Hemorrhoids    History of prostatitis    Lumbar herniated disc 2007   L4 and L5 did not require surgery   [place]    [place]  RADIOACTIVE SEED IMPLANTS 05-09-2013   Past Surgical History:  Procedure Laterality Date   APPENDECTOMY  FEB 2000   CATARACT EXTRACTION W/ INTRAOCULAR LENS  IMPLANT, BILATERAL     GASTROCNEMIUS RECESSION Left 06/09/2021   Procedure: Left gastroc recession;  Surgeon: HWylene Simmer MD;  [facility]  Service: Orthopedics;  Laterality: Left;   HYDROCELE EXCISION Bilateral 01/02/2014   Procedure: BILATERAL HYDROCELECTOMY;  Surgeon: Claybon Jabs, MD;  [facility];  Service: Urology;  Laterality: Bilateral;   HYDROCELE EXCISION Left 04/17/2014   Procedure: LEFT HYDROCELECTOMY ADULT;  Surgeon: Claybon Jabs, MD;  Location: [facility];  Service: Urology;  Laterality: Left;   KNEE ARTHROSCOPY Left    PLANTAR FASCIA RELEASE Left 06/09/2021   Procedure: Plantar fascia release;  Surgeon: Wylene Simmer, MD;  Location: [facility];  Service: Orthopedics;  Laterality: Left;   RADIOACTIVE SEED IMPLANT N/A 05/09/2013   Procedure:  RADIOACTIVE SEED IMPLANT;  Surgeon: Claybon Jabs, MD;  Location: [facility];  Service: Urology;  Laterality: N/A;   Family History  Problem Relation Age of Onset   Hypertension Father    Gout Father    Prostate cancer Father    Lung cancer Mother        +smoker   Lung cancer Maternal Grandfather        -smoker   Heart disease Sister    Heart disease Brother    Social History   Socioeconomic History   Marital status: Married    Spouse name: Not on file   Number of children: Not on file   Years of education: Not on file   Highest education level: Not on file  Occupational History   Occupation: Consulting/professor  Tobacco Use   Smoking status: Former    Types: Pipe    Quit date: 11/13/1976    Years since quitting: 45.7   Smokeless tobacco: Never  Vaping Use   Vaping Use: Never used  Substance and Sexual Activity   Alcohol use: Yes    Alcohol/week: 14.0 standard drinks of alcohol    Types: 14 Glasses of wine per week   Drug use: No   Sexual activity: Not Currently  Other Topics Concern   Not on file  Social History Narrative   Married      Has consulting job, 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, statistics, occupational behavior      [place], active lifestyle- swims, runs, lifts [employer]   Social Determinants of Health   Financial Resource Strain: Low Risk  (08/16/2022)   Overall Financial Resource Strain (CARDIA)    Difficulty of Paying Living Expenses: Not hard at all  Food Insecurity: No Food Insecurity (08/16/2022)   Hunger Vital Sign    Worried About Running Out of Food in the Last Year: Never true    [place] in the Last Year: Never true  Transportation Needs: No Transportation Needs (08/16/2022)   PRAPARE - Hydrologist (Medical): No    Lack of Transportation (Non-Medical): No  Physical Activity:  Sufficiently Active (08/16/2022)   Exercise Vital Sign    Days of Exercise per Week: 6 days    Minutes of Exercise per Session: 60 min  Stress: No Stress Concern Present (08/16/2022)   [place]    Feeling of Stress : Not at all  Social Connections: Unknown (08/16/2022)   Social Connection and Isolation Panel [NHANES]    Frequency of Communication with Friends and Family: Not on file    Frequency of Social Gatherings with Friends and Family: Not on file    Attends Religious Services: Not on file  Active Member of Clubs or Organizations: Not on file    Attends Archivist Meetings: Not on file    Marital Status: Married    Tobacco Counseling Counseling given: Not Answered   Clinical Intake:  Pre-visit preparation completed: Yes        Diabetes: No  How often do you need to have someone help you when you read instructions, pamphlets, or other written materials from your doctor or pharmacy?: 1 - Never   Interpreter Needed?: No      Activities of Daily Living    08/16/2022    1:10 PM  In your present state of health, do you have any difficulty performing the following activities:  Hearing? 1  Comment Hearing aids  Vision? 0  Difficulty concentrating or making decisions? 0  Walking or climbing stairs? 0  Dressing or bathing? 0  Doing errands, shopping? 0  Preparing Food and eating ? N  Using the Toilet? N  In the past six months, have you accidently leaked urine? N  Do you have problems with loss of bowel control? N  Managing your Medications? N  Managing your Finances? N  Housekeeping or managing your Housekeeping? N    Patient Care Team: Burnard Hawthorne, FNP as PCP - General (Family Medicine)  Indicate any recent Medical Services you may have received from other than [facility] providers in the past year (date may be approximate).     Assessment:   This is a routine wellness  examination for Jartavious.  I connected with  Janit Pagan on 08/16/22 by a audio enabled telemedicine application and verified that I am speaking with the correct person using two identifiers.  Patient Location: Home  Provider Location: Office/Clinic  I discussed the limitations of evaluation and management by telemedicine. The patient expressed understanding and agreed to proceed.   Hearing/Vision screen Hearing Screening - Comments:: Hearing aids Vision Screening - Comments:: Followed by Dr. Noreene Larsson Cataract extraction, bilateral They have seen their ophthalmologist in the last 12 months.  Lasik completed 10 years ago  Dietary issues and exercise activities discussed: Current Exercise Habits: Home exercise routine, Type of exercise: calisthenics;strength training/weights (swimming, running, hiking), Time (Minutes): 60, Frequency (Times/Week): 6, Weekly Exercise (Minutes/Week): 360, Intensity: Mild Intermittent fasting Healthy diet Good water intake   Goals Addressed               This Visit's Progress     Patient Stated     Weight (lb) < 236 lb (107 kg) (pt-stated)   246 lb (111.6 kg)     Weight goal 235lb       Depression Screen    08/16/2022    1:15 PM 08/24/2021    3:10 PM 10/22/2020   10:47 AM 03/19/2020    9:29 AM 02/20/2019    9:34 AM 02/14/2018   11:47 AM 02/14/2018   10:30 AM  PHQ 2/9 Scores  PHQ - 2 Score 0 0 0 0 0 0 0    Fall Risk    08/16/2022    1:29 PM 10/27/2021    2:09 PM 08/24/2021    3:09 PM 03/19/2020    9:29 AM 02/20/2019    9:34 AM  [place] in the past year? 0 0 1 0 0  Number falls in past yr: 0 0 0    Injury with Fall? 0 0 0    Risk for fall due to : No Fall Risks  Follow up Falls evaluation completed  Falls evaluation completed Falls evaluation completed Falls evaluation completed    [place]: Home free of loose throw rugs in walkways, pet beds, electrical cords, etc? Yes   Adequate lighting in your home to reduce risk of falls? Yes   ASSISTIVE DEVICES UTILIZED TO PREVENT FALLS: Life alert? No  Use of a cane, walker or w/c? No   TIMED UP AND GO: Was the test performed? No .   Cognitive Function:  Patient is alert and oriented x3.  100% independent.  Manages his own medications and finances.  Denies difficulty with memory loss.        08/16/2022    1:25 PM  6CIT Screen  Months in reverse 0 points    Immunizations Immunization History  Administered Date(s) Administered   Fluad Quad(high Dose 65+) 07/08/2019, 08/05/2020, 08/15/2022   Influenza Split 08/22/2011, 06/13/2013   Influenza Whole 08/16/2010   Influenza, High Dose Seasonal PF 07/15/2018   Influenza-Unspecified 07/20/2016, 06/27/2017, 08/08/2021, 08/15/2021   Moderna Sars-Covid-2 Vaccination 12/19/2019, 01/16/2020, 09/23/2020, 08/08/2021, 08/15/2022   Pfizer Covid-19 Vaccine Bivalent Booster 52yr & up 08/15/2021   Pneumococcal Conjugate-13 12/11/2013   Pneumococcal Polysaccharide-23 09/13/2004, 06/14/2010, 11/27/2011   Td 04/18/2005   Tdap 02/01/2016   Zoster Recombinat (Shingrix) 03/28/2018   Zoster, Live 06/05/2011   Screening Tests Health Maintenance  Topic Date Due   Zoster Vaccines- Shingrix (2 of 2) 10/13/2022 (Originally 05/23/2018)   COVID-19 Vaccine (6 - Moderna risk series) 10/10/2022   TETANUS/TDAP  01/31/2026   Pneumonia Vaccine 81 Years old  Completed   INFLUENZA VACCINE  Completed   HPV VACCINES  Aged Out   Hepatitis C Screening  Discontinued   Fecal DNA (Cologuard)  Discontinued   Health Maintenance There are no preventive care reminders to display for this patient.  Hepatitis C Screening: does not qualify.  Vision Screening: Recommended annual ophthalmology exams for early detection of glaucoma and other disorders of the eye.  Dental Screening: Recommended annual dental exams for proper oral hygiene  Community Resource Referral / Chronic Care  Management: CRR required this visit?  No   CCM required this visit?  No      Plan:     I have personally reviewed and noted the following in the patient's chart:   Medical and social history Use of alcohol, tobacco or illicit drugs  Current medications and supplements including opioid prescriptions. Patient is not currently taking opioid prescriptions. Functional ability and status Nutritional status Physical activity Advanced directives List of other physicians Hospitalizations, surgeries, and ER visits in previous 12 months Vitals Screenings to include cognitive, depression, and falls Referrals and appointments  In addition, I have reviewed and discussed with patient certain preventive protocols, quality metrics, and best practice recommendations. A written personalized care plan for preventive services as well as general preventive health recommendations were provided to patient.     OVarney Biles LPN   [id number]

## 2022-09-08 ENCOUNTER — Other Ambulatory Visit: Payer: Self-pay

## 2022-09-08 ENCOUNTER — Encounter: Payer: Self-pay | Admitting: Family

## 2022-09-08 ENCOUNTER — Ambulatory Visit (INDEPENDENT_AMBULATORY_CARE_PROVIDER_SITE_OTHER): Payer: PPO | Admitting: Family

## 2022-09-08 VITALS — BP 116/70 | HR 61 | Temp 97.7°F | Ht 77.0 in | Wt 258.8 lb

## 2022-09-08 DIAGNOSIS — Z8546 Personal history of malignant neoplasm of prostate: Secondary | ICD-10-CM | POA: Diagnosis not present

## 2022-09-08 DIAGNOSIS — E559 Vitamin D deficiency, unspecified: Secondary | ICD-10-CM

## 2022-09-08 DIAGNOSIS — Z0000 Encounter for general adult medical examination without abnormal findings: Secondary | ICD-10-CM

## 2022-09-08 DIAGNOSIS — I700 Atherosclerosis of aorta: Secondary | ICD-10-CM

## 2022-09-08 DIAGNOSIS — G6289 Other specified polyneuropathies: Secondary | ICD-10-CM

## 2022-09-08 DIAGNOSIS — G629 Polyneuropathy, unspecified: Secondary | ICD-10-CM

## 2022-09-08 MED ORDER — GABAPENTIN 300 MG PO CAPS: ORAL_CAPSULE | ORAL | 3 refills | Status: DC

## 2022-09-08 NOTE — Progress Notes (Signed)
Subjective:    Patient ID: Joshua Dominguez, male    DOB: 03/09/1941, 81 y.o.   MRN: [id number]  CC: Joshua Dominguez is a 81 y.o. male who presents today for physical exam.    HPI: Feels well today  No new complaints    Atherosclerosis of aorta-compliant with Crestor 5 mg  Peripheral neuropathy-feels pain is managed currently. compliant with amitriptyline 50 mg, gabapentin '600mg'$  qam and '1200mg'$  qpm. He takes prn tylenol or advil for breakthrough pain with dulling of pain.  He does not find regimen sedating  Colorectal  Cancer Screening: He is no longer screening for colon cancer, last 2010 and to repeat in 10 years.  No repeat colonoscopy. Declines having colon cancer screening  Prostate Cancer Screening: History of prostate cancer.  Status post radioactive seed  Lung Cancer Screening: No 30 year pack year history and > 50 years to 38 years.  Immunizations       Tetanus - UTD Labs: Screening labs today. Exercise: Gets regular exercise with elliptical, weight lifting. He skis. Denies CP, sob.   Alcohol use:  occasional Smoking/tobacco use: former smoker, quit 1978     HISTORY:  Past Medical History:  Diagnosis Date   Allergic rhinitis    Bilateral hydrocele    DDD (degenerative disc disease), lumbar     L3-L4   ED (erectile dysfunction) of organic origin    Gout    Hemorrhoids    History of prostatitis    Lumbar herniated disc 2007   L4 and L5 did not require surgery   Prostate cancer ([place])    S/P  RADIOACTIVE SEED IMPLANTS 05-09-2013    Past Surgical History:  Procedure Laterality Date   APPENDECTOMY  FEB 2000   CATARACT EXTRACTION W/ INTRAOCULAR LENS  IMPLANT, BILATERAL     GASTROCNEMIUS RECESSION Left 06/09/2021   Procedure: Left gastroc recession;  Surgeon: Wylene Simmer, MD;  Location: [place];  Service: Orthopedics;  Laterality: Left;   HYDROCELE EXCISION Bilateral 01/02/2014   Procedure: BILATERAL HYDROCELECTOMY;  Surgeon: Claybon Jabs, MD;   Location: [facility];  Service: Urology;  Laterality: Bilateral;   HYDROCELE EXCISION Left 04/17/2014   Procedure: LEFT HYDROCELECTOMY ADULT;  Surgeon: Claybon Jabs, MD;  Location: [facility];  Service: Urology;  Laterality: Left;   KNEE ARTHROSCOPY Left    PLANTAR FASCIA RELEASE Left 06/09/2021   Procedure: Plantar fascia release;  Surgeon: Wylene Simmer, MD;  Location: [place];  Service: Orthopedics;  Laterality: Left;   RADIOACTIVE SEED IMPLANT N/A 05/09/2013   Procedure: RADIOACTIVE SEED IMPLANT;  Surgeon: Claybon Jabs, MD;  Location: [facility];  Service: Urology;  Laterality: N/A;   Family History  Problem Relation Age of Onset   Hypertension Father    Gout Father    Prostate cancer Father    Lung cancer Mother        +smoker   Lung cancer Maternal Grandfather        -smoker   Heart disease Sister    Heart disease Brother       ALLERGIES: Celebrex [celecoxib] and Oxymetazoline hcl  Current Outpatient Medications on File Prior to Visit  Medication Sig Dispense Refill   amitriptyline (ELAVIL) 50 MG tablet TAKE 1 TABLET BY MOUTH DAILY AT BEDTIME 90 tablet 3   Febuxostat 80 MG TABS TAKE 1 TABLET(80 MG) BY MOUTH DAILY 90 tablet 1   glucosamine-chondroitin 500-400 MG tablet Take 1 tablet  by mouth 2 (two) times daily.     meloxicam (MOBIC) 7.5 MG tablet Take 1 tablet (7.5 mg total) by mouth daily as needed for pain. 30 tablet 1   metroNIDAZOLE (METROGEL) 1 % gel Apply topically at bedtime. Qhs to face for Rosacea 45 g 11   Multiple Vitamin (MULTIVITAMIN) tablet Take 1 tablet by mouth daily.     Omega-3 Fatty Acids (FISH OIL) 1200 MG CAPS Take 1 capsule by mouth 2 (two) times daily.     rosuvastatin (CRESTOR) 5 MG tablet Take 1 tablet (5 mg total) by mouth daily. 90 tablet 3   No current facility-administered medications on file prior to visit.    Social History   Tobacco Use   Smoking status: Former    Types:  Pipe    Quit date: 11/13/1976    Years since quitting: 45.8   Smokeless tobacco: Never  Vaping Use   Vaping Use: Never used  Substance Use Topics   Alcohol use: Yes    Alcohol/week: 14.0 standard drinks of alcohol    Types: 14 Glasses of wine per week   Drug use: No    Review of Systems  Constitutional:  Negative for chills and fever.  HENT:  Negative for congestion.   Respiratory:  Negative for cough.   Cardiovascular:  Negative for chest pain, palpitations and leg swelling.  Gastrointestinal:  Negative for diarrhea, nausea and vomiting.  Musculoskeletal:  Negative for myalgias.  Skin:  Negative for rash.  Neurological:  Negative for headaches.  Hematological:  Negative for adenopathy.  Psychiatric/Behavioral:  Negative for confusion.       Objective:    BP 116/70 (BP Location: Left Arm, Patient Position: Sitting, Cuff Size: Normal)   Pulse 61   Temp 97.7 F (36.5 C) (Oral)   Ht '6\' 5"'$  (1.956 m)   Wt 258 lb 12.8 oz (117.4 kg)   SpO2 99%   BMI 30.69 kg/m   BP Readings from Last 3 Encounters:  09/08/22 116/70  03/10/22 112/68  10/27/21 132/87   Wt Readings from Last 3 Encounters:  09/08/22 258 lb 12.8 oz (117.4 kg)  08/16/22 246 lb (111.6 kg)  03/10/22 255 lb 12.8 oz (116 kg)    Physical Exam Vitals reviewed.  Constitutional:      Appearance: He is well-developed.  Neck:     Thyroid: No thyroid mass or thyromegaly.  Cardiovascular:     Rate and Rhythm: Regular rhythm.     Heart sounds: Normal heart sounds.  Pulmonary:     Effort: Pulmonary effort is normal. No respiratory distress.     Breath sounds: Normal breath sounds. No wheezing, rhonchi or rales.  Lymphadenopathy:     Head:     Right side of head: No submental, submandibular, tonsillar, preauricular, posterior auricular or occipital adenopathy.     Left side of head: No submental, submandibular, tonsillar, preauricular, posterior auricular or occipital adenopathy.     Cervical: No cervical  adenopathy.  Skin:    General: Skin is warm and dry.  Neurological:     Mental Status: He is alert.  Psychiatric:        Speech: Speech normal.        Behavior: Behavior normal.        Assessment & Plan:   Problem List Items Addressed This Visit       Cardiovascular and Mediastinum   Atherosclerosis of aorta ([place])    Lab Results  Component Value Date   LDLCALC 157 (H)  08/26/2021  Anticipate improved. Pending lipid panel.  Long discussion in regards to further cardiac risk stratification with CT calcium score.  Patient will consider this test and let me know. continue crestor '5mg'$ .       Relevant Orders   Lipid panel     Nervous and Auditory   Peripheral neuropathy    Overall well controlled.  He is no longer following pain management.  Continue amitriptyline 50 mg, gabapentin '600mg'$  qam and '1200mg'$  qpm,  prn tylenol or advil for breakthrough pain        Other   Routine physical examination - Primary    Congratulated patient on diligence to exercise.  Baseline labs ordered today.      Relevant Orders   CBC with Differential/Platelet   Comprehensive metabolic panel   Lipid panel   PSA   TSH   VITAMIN D 25 Hydroxy (Vit-D Deficiency, Fractures)   Other Visit Diagnoses     Neuropathy       Vitamin D deficiency       Relevant Orders   VITAMIN D 25 Hydroxy (Vit-D Deficiency, Fractures)   History of prostate cancer       Relevant Orders   PSA        I have discontinued Gwyndolyn Saxon L. Reif "Bill"'s gabapentin. I am also having him maintain his Fish Oil, glucosamine-chondroitin, multivitamin, Febuxostat, metroNIDAZOLE, amitriptyline, meloxicam, and rosuvastatin.   Meds ordered this encounter  Medications   DISCONTD: gabapentin (NEURONTIN) 300 MG capsule    Sig: TAKE 2 CAPSULES BY MOUTH EVERY MORNING AND TAKE 4 CAPSULES AT BEDTIME AS DIRECTED    Dispense:  540 capsule    Refill:  3    Order Specific Question:   Supervising Provider    Answer:   Crecencio Mc  [[id number]]    Return precautions given.   Risks, benefits, and alternatives of the medications and treatment plan prescribed today were discussed, and patient expressed understanding.   Education regarding symptom management and diagnosis given to patient on AVS.   Continue to follow with Burnard Hawthorne, FNP for routine health maintenance.   Joshua Dominguez and I agreed with plan.   Mable Paris, FNP

## 2022-09-08 NOTE — Assessment & Plan Note (Signed)
Congratulated patient on diligence to exercise.  Baseline labs ordered today.

## 2022-09-08 NOTE — Assessment & Plan Note (Addendum)
Lab Results  Component Value Date   LDLCALC 157 (H) 08/26/2021   Anticipate improved. Pending lipid panel.  Long discussion in regards to further cardiac risk stratification with CT calcium score.  Patient will consider this test and let me know. continue crestor '5mg'$ .

## 2022-09-08 NOTE — Assessment & Plan Note (Signed)
Overall well controlled.  He is no longer following pain management.  Continue amitriptyline 50 mg, gabapentin '600mg'$  qam and '1200mg'$  qpm,  prn tylenol or advil for breakthrough pain

## 2022-09-08 NOTE — Patient Instructions (Addendum)
If you would like  for further cardiac risk assessment including CT calcium score test ( noninvasive test to assess for coronary artery disease) , please let me know.  Typically cost $150 as not covered by insurance.   As discussed, please let me know if you would like from your list   Nice to see you!  Health Maintenance, Male Adopting a healthy lifestyle and getting preventive care are important in promoting health and wellness. Ask your health care provider about: The right schedule for you to have regular tests and exams. Things you can do on your own to prevent diseases and keep yourself healthy. What should I know about diet, weight, and exercise? Eat a healthy diet  Eat a diet that includes plenty of vegetables, fruits, low-fat dairy products, and lean protein. Do not eat a lot of foods that are high in solid fats, added sugars, or sodium. Maintain a healthy weight Body mass index (BMI) is a measurement that can be used to identify possible weight problems. It estimates body fat based on height and weight. Your health care provider can help determine your BMI and help you achieve or maintain a healthy weight. Get regular exercise Get regular exercise. This is one of the most important things you can do for your health. Most adults should: Exercise for at least 150 minutes each week. The exercise should increase your heart rate and make you sweat (moderate-intensity exercise). Do strengthening exercises at least twice a week. This is in addition to the moderate-intensity exercise. Spend less time sitting. Even light physical activity can be beneficial. Watch cholesterol and blood lipids Have your blood tested for lipids and cholesterol at 79 years of age, then have this test every 5 years. You may need to have your cholesterol levels checked more often if: Your lipid or cholesterol levels are high. You are older than 79 years of age. You are at high risk for heart disease. What  should I know about cancer screening? Many types of cancers can be detected early and may often be prevented. Depending on your health history and family history, you may need to have cancer screening at various ages. This may include screening for: Colorectal cancer. Prostate cancer. Skin cancer. Lung cancer. What should I know about heart disease, diabetes, and high blood pressure? Blood pressure and heart disease High blood pressure causes heart disease and increases the risk of stroke. This is more likely to develop in people who have high blood pressure readings or are overweight. Talk with your health care provider about your target blood pressure readings. Have your blood pressure checked: Every 3-5 years if you are 79-42 years of age. Every year if you are 79 years old or older. If you are between the ages of 46 and 60 and are a current or former smoker, ask your health care provider if you should have a one-time screening for abdominal aortic aneurysm (AAA). Diabetes Have regular diabetes screenings. This checks your fasting blood sugar level. Have the screening done: Once every three years after age 70 if you are at a normal weight and have a low risk for diabetes. More often and at a younger age if you are overweight or have a high risk for diabetes. What should I know about preventing infection? Hepatitis B If you have a higher risk for hepatitis B, you should be screened for this virus. Talk with your health care provider to find out if you are at risk for hepatitis B infection.  Hepatitis C Blood testing is recommended for: Everyone born from [birth year] through 1963. Anyone with known risk factors for hepatitis C. Sexually transmitted infections (STIs) You should be screened each year for STIs, including gonorrhea and chlamydia, if: You are sexually active and are younger than 79 years of age. You are older than 79 years of age and your health care provider tells you that you are  at risk for this type of infection. Your sexual activity has changed since you were last screened, and you are at increased risk for chlamydia or gonorrhea. Ask your health care provider if you are at risk. Ask your health care provider about whether you are at high risk for HIV. Your health care provider may recommend a prescription medicine to help prevent HIV infection. If you choose to take medicine to prevent HIV, you should first get tested for HIV. You should then be tested every 3 months for as long as you are taking the medicine. Follow these instructions at home: Alcohol use Do not drink alcohol if your health care provider tells you not to drink. If you drink alcohol: Limit how much you have to 0-2 drinks a day. Know how much alcohol is in your drink. In the [place] one drink equals one 12 oz bottle of beer (355 mL), one 5 oz glass of wine (148 mL), or one 1 oz glass of hard liquor (44 mL). Lifestyle Do not use any products that contain nicotine or tobacco. These products include cigarettes, chewing tobacco, and vaping devices, such as e-cigarettes. If you need help quitting, ask your health care provider. Do not use street drugs. Do not share needles. Ask your health care provider for help if you need support or information about quitting drugs. General instructions Schedule regular health, dental, and eye exams. Stay current with your vaccines. Tell your health care provider if: You often feel depressed. You have ever been abused or do not feel safe at home. Summary Adopting a healthy lifestyle and getting preventive care are important in promoting health and wellness. Follow your health care provider's instructions about healthy diet, exercising, and getting tested or screened for diseases. Follow your health care provider's instructions on monitoring your cholesterol and blood pressure. This information is not intended to replace advice given to you by your health care provider.  Make sure you discuss any questions you have with your health care provider. Document Revised: 03/21/2021 Document Reviewed: 03/21/2021 Elsevier Patient Education  [employer].

## 2022-09-11 ENCOUNTER — Encounter: Payer: PPO | Admitting: Family

## 2022-09-12 ENCOUNTER — Other Ambulatory Visit: Payer: Self-pay | Admitting: Family

## 2022-09-13 ENCOUNTER — Other Ambulatory Visit (INDEPENDENT_AMBULATORY_CARE_PROVIDER_SITE_OTHER): Payer: PPO

## 2022-09-13 DIAGNOSIS — Z8546 Personal history of malignant neoplasm of prostate: Secondary | ICD-10-CM

## 2022-09-13 DIAGNOSIS — Z0000 Encounter for general adult medical examination without abnormal findings: Secondary | ICD-10-CM | POA: Diagnosis not present

## 2022-09-13 DIAGNOSIS — I700 Atherosclerosis of aorta: Secondary | ICD-10-CM | POA: Diagnosis not present

## 2022-09-13 DIAGNOSIS — E559 Vitamin D deficiency, unspecified: Secondary | ICD-10-CM

## 2022-09-13 LAB — COMPREHENSIVE METABOLIC PANEL
ALT: 23 U/L (ref 0–53)
AST: 28 U/L (ref 0–37)
Albumin: 4.2 g/dL (ref 3.5–5.2)
Alkaline Phosphatase: 59 U/L (ref 39–117)
BUN: 24 mg/dL — ABNORMAL HIGH (ref 6–23)
CO2: 33 mEq/L — ABNORMAL HIGH (ref 19–32)
Calcium: 9.3 mg/dL (ref 8.4–10.5)
Chloride: 105 mEq/L (ref 96–112)
Creatinine, Ser: 1.24 mg/dL (ref 0.40–1.50)
GFR: 55.37 mL/min — ABNORMAL LOW (ref >60.00)
Glucose, Bld: 79 mg/dL (ref 70–99)
Potassium: 4.3 mEq/L (ref 3.5–5.1)
Sodium: 141 mEq/L (ref 135–145)
Total Bilirubin: 0.8 mg/dL (ref 0.2–1.2)
Total Protein: 6.5 g/dL (ref 6.0–8.3)

## 2022-09-13 LAB — CBC WITH DIFFERENTIAL/PLATELET
Basophils Absolute: 0 10*3/uL (ref 0.0–0.1)
Basophils Relative: 0.9 % (ref 0.0–3.0)
Eosinophils Absolute: 0.1 10*3/uL (ref 0.0–0.7)
Eosinophils Relative: 2.1 % (ref 0.0–5.0)
HCT: 42.8 % (ref 39.0–52.0)
Hemoglobin: 14.6 g/dL (ref 13.0–17.0)
Lymphocytes Relative: 38.3 % (ref 12.0–46.0)
Lymphs Abs: 1.9 10*3/uL (ref 0.7–4.0)
MCHC: 34.1 g/dL (ref 30.0–36.0)
MCV: 94 fl (ref 78.0–100.0)
Monocytes Absolute: 0.4 10*3/uL (ref 0.1–1.0)
Monocytes Relative: 8.5 % (ref 3.0–12.0)
Neutro Abs: 2.5 10*3/uL (ref 1.4–7.7)
Neutrophils Relative %: 50.2 % (ref 43.0–77.0)
Platelets: 140 10*3/uL — ABNORMAL LOW (ref 150.0–400.0)
RBC: 4.56 Mil/uL (ref 4.22–5.81)
RDW: 13.5 % (ref 11.5–15.5)
WBC: 4.9 10*3/uL (ref 4.0–10.5)

## 2022-09-13 LAB — VITAMIN D 25 HYDROXY (VIT D DEFICIENCY, FRACTURES): VITD: 32.2 ng/mL (ref 30.00–100.00)

## 2022-09-13 LAB — LIPID PANEL
Cholesterol: 135 mg/dL (ref 0–200)
HDL: 39.9 mg/dL (ref >39.00)
LDL Cholesterol: 74 mg/dL (ref 0–99)
NonHDL: 95.54
Total CHOL/HDL Ratio: 3
Triglycerides: 109 mg/dL (ref 0.0–149.0)
VLDL: 21.8 mg/dL (ref 0.0–40.0)

## 2022-09-13 LAB — TSH: TSH: 2.71 u[IU]/mL (ref 0.35–5.50)

## 2022-09-13 LAB — PSA: PSA: 0.05 ng/mL — ABNORMAL LOW (ref 0.10–4.00)

## 2022-09-14 ENCOUNTER — Other Ambulatory Visit: Payer: Self-pay | Admitting: Family

## 2022-09-14 DIAGNOSIS — R899 Unspecified abnormal finding in specimens from other organs, systems and tissues: Secondary | ICD-10-CM

## 2022-09-14 DIAGNOSIS — Z0000 Encounter for general adult medical examination without abnormal findings: Secondary | ICD-10-CM

## 2022-10-03 DIAGNOSIS — E669 Obesity, unspecified: Secondary | ICD-10-CM | POA: Diagnosis not present

## 2022-10-03 DIAGNOSIS — E785 Hyperlipidemia, unspecified: Secondary | ICD-10-CM | POA: Diagnosis not present

## 2022-10-03 DIAGNOSIS — C61 Malignant neoplasm of prostate: Secondary | ICD-10-CM | POA: Diagnosis not present

## 2022-10-03 DIAGNOSIS — G63 Polyneuropathy in diseases classified elsewhere: Secondary | ICD-10-CM | POA: Diagnosis not present

## 2022-10-03 DIAGNOSIS — I700 Atherosclerosis of aorta: Secondary | ICD-10-CM | POA: Diagnosis not present

## 2022-10-03 DIAGNOSIS — G8929 Other chronic pain: Secondary | ICD-10-CM | POA: Diagnosis not present

## 2022-10-03 DIAGNOSIS — Z87891 Personal history of nicotine dependence: Secondary | ICD-10-CM | POA: Diagnosis not present

## 2022-10-03 DIAGNOSIS — G4733 Obstructive sleep apnea (adult) (pediatric): Secondary | ICD-10-CM | POA: Diagnosis not present

## 2022-10-03 DIAGNOSIS — Z9849 Cataract extraction status, unspecified eye: Secondary | ICD-10-CM | POA: Diagnosis not present

## 2022-10-03 DIAGNOSIS — M109 Gout, unspecified: Secondary | ICD-10-CM | POA: Diagnosis not present

## 2022-10-11 ENCOUNTER — Telehealth: Payer: Self-pay | Admitting: Family

## 2022-10-11 NOTE — Telephone Encounter (Signed)
The patient came in stating that his insurance denied the claim due to a wrong dx code. The  patient had a TSH drawn on 09/13/22 . The dx code that was submitted to insurance was z00.00.

## 2022-10-12 ENCOUNTER — Other Ambulatory Visit: Payer: Self-pay | Admitting: Family

## 2022-10-12 DIAGNOSIS — G629 Polyneuropathy, unspecified: Secondary | ICD-10-CM

## 2022-11-16 ENCOUNTER — Other Ambulatory Visit (INDEPENDENT_AMBULATORY_CARE_PROVIDER_SITE_OTHER): Payer: PPO

## 2022-11-16 DIAGNOSIS — R899 Unspecified abnormal finding in specimens from other organs, systems and tissues: Secondary | ICD-10-CM | POA: Diagnosis not present

## 2022-11-16 LAB — CBC WITH DIFFERENTIAL/PLATELET
Basophils Absolute: 0 10*3/uL (ref 0.0–0.1)
Basophils Relative: 0.4 % (ref 0.0–3.0)
Eosinophils Absolute: 0.1 10*3/uL (ref 0.0–0.7)
Eosinophils Relative: 1.9 % (ref 0.0–5.0)
HCT: 42.5 % (ref 39.0–52.0)
Hemoglobin: 14.6 g/dL (ref 13.0–17.0)
Lymphocytes Relative: 36.9 % (ref 12.0–46.0)
Lymphs Abs: 2.2 10*3/uL (ref 0.7–4.0)
MCHC: 34.4 g/dL (ref 30.0–36.0)
MCV: 94.6 fl (ref 78.0–100.0)
Monocytes Absolute: 0.6 10*3/uL (ref 0.1–1.0)
Monocytes Relative: 9.1 % (ref 3.0–12.0)
Neutro Abs: 3.1 10*3/uL (ref 1.4–7.7)
Neutrophils Relative %: 51.7 % (ref 43.0–77.0)
Platelets: 165 10*3/uL (ref 150.0–400.0)
RBC: 4.49 Mil/uL (ref 4.22–5.81)
RDW: 13.5 % (ref 11.5–15.5)
WBC: 6.1 10*3/uL (ref 4.0–10.5)

## 2022-11-29 ENCOUNTER — Other Ambulatory Visit: Payer: Self-pay | Admitting: Podiatry

## 2022-11-29 ENCOUNTER — Encounter: Payer: Self-pay | Admitting: Podiatry

## 2022-11-29 ENCOUNTER — Ambulatory Visit (INDEPENDENT_AMBULATORY_CARE_PROVIDER_SITE_OTHER): Payer: PPO

## 2022-11-29 ENCOUNTER — Ambulatory Visit (INDEPENDENT_AMBULATORY_CARE_PROVIDER_SITE_OTHER): Payer: PPO | Admitting: Podiatry

## 2022-11-29 DIAGNOSIS — M778 Other enthesopathies, not elsewhere classified: Secondary | ICD-10-CM | POA: Diagnosis not present

## 2022-11-29 DIAGNOSIS — M2032 Hallux varus (acquired), left foot: Secondary | ICD-10-CM

## 2022-11-29 NOTE — Progress Notes (Signed)
Subjective:  Patient ID: Joshua Dominguez, male    DOB: January 25, 1941,  MRN: [id number] HPI Chief Complaint  Patient presents with   Toe Pain    Hallux left - sore that won't heal, keeps covered, but sometimes the slightest pressure makes it bleed   New Patient (Initial Visit)    Est 2020    82 y.o. male presents with the above complaint.   ROS: Denies fever chills nausea mobic muscle aches pains calf pain back pain chest pain shortness of breath.  Past Medical History:  Diagnosis Date   Allergic rhinitis    Bilateral hydrocele    DDD (degenerative disc disease), lumbar     L3-L4   ED (erectile dysfunction) of organic origin    Gout    Hemorrhoids    History of prostatitis    Lumbar herniated disc 2007   L4 and L5 did not require surgery   Prostate cancer ([place])    S/P  RADIOACTIVE SEED IMPLANTS 05-09-2013   Past Surgical History:  Procedure Laterality Date   APPENDECTOMY  FEB 2000   CATARACT EXTRACTION W/ INTRAOCULAR LENS  IMPLANT, BILATERAL     GASTROCNEMIUS RECESSION Left 06/09/2021   Procedure: Left gastroc recession;  Surgeon: Wylene Simmer, MD;  Location: [place];  Service: Orthopedics;  Laterality: Left;   HYDROCELE EXCISION Bilateral 01/02/2014   Procedure: BILATERAL HYDROCELECTOMY;  Surgeon: Claybon Jabs, MD;  Location: [facility];  Service: Urology;  Laterality: Bilateral;   HYDROCELE EXCISION Left 04/17/2014   Procedure: LEFT HYDROCELECTOMY ADULT;  Surgeon: Claybon Jabs, MD;  Location: [facility];  Service: Urology;  Laterality: Left;   KNEE ARTHROSCOPY Left    PLANTAR FASCIA RELEASE Left 06/09/2021   Procedure: Plantar fascia release;  Surgeon: Wylene Simmer, MD;  Location: [place];  Service: Orthopedics;  Laterality: Left;   RADIOACTIVE SEED IMPLANT N/A 05/09/2013   Procedure: RADIOACTIVE SEED IMPLANT;  Surgeon: Claybon Jabs, MD;  Location: [facility];  Service: Urology;  Laterality:  N/A;    Current Outpatient Medications:    amitriptyline (ELAVIL) 50 MG tablet, TAKE 1 TABLET BY MOUTH DAILY AT BEDTIME, Disp: 90 tablet, Rfl: 3   Febuxostat 80 MG TABS, TAKE 1 TABLET(80 MG) BY MOUTH DAILY, Disp: 90 tablet, Rfl: 1   gabapentin (NEURONTIN) 300 MG capsule, TAKE 2 CAPSULES BY MOUTH EVERY MORNING AND TAKE 4 CAPSULES AT BEDTIME AS DIRECTED, Disp: 540 capsule, Rfl: 3   glucosamine-chondroitin 500-400 MG tablet, Take 1 tablet by mouth 2 (two) times daily., Disp: , Rfl:    meloxicam (MOBIC) 7.5 MG tablet, Take 1 tablet (7.5 mg total) by mouth daily as needed for pain., Disp: 30 tablet, Rfl: 1   metroNIDAZOLE (METROGEL) 1 % gel, Apply topically at bedtime. Qhs to face for Rosacea, Disp: 45 g, Rfl: 11   Multiple Vitamin (MULTIVITAMIN) tablet, Take 1 tablet by mouth daily., Disp: , Rfl:    Omega-3 Fatty Acids (FISH OIL) 1200 MG CAPS, Take 1 capsule by mouth 2 (two) times daily., Disp: , Rfl:    rosuvastatin (CRESTOR) 5 MG tablet, Take 1 tablet (5 mg total) by mouth daily., Disp: 90 tablet, Rfl: 3  Allergies  Allergen Reactions   Celebrex [Celecoxib] Rash   Oxymetazoline Hcl Rash   Review of Systems Objective:  There were no vitals filed for this visit.  General: Well developed, nourished, in no acute distress, alert and oriented x3   Dermatological: Skin is warm, dry  and supple bilateral. Nails x 10 are well maintained; remaining integument appears unremarkable at this time. There are no open sores, no preulcerative lesions, no rash or signs of infection present.  Soft tissue lesion appears to be healed to the dorsal medial aspect of the prominence at the level of the hallux interphalangeal joint left foot.  Vascular: Dorsalis Pedis artery and Posterior Tibial artery pedal pulses are 2/4 bilateral with immedate capillary fill time. Pedal hair growth present. No varicosities and no lower extremity edema present bilateral.   Neruologic: Grossly intact via light touch bilateral.  Vibratory intact via tuning fork bilateral. Protective threshold with Semmes Wienstein monofilament intact to all pedal sites bilateral. Patellar and Achilles deep tendon reflexes 2+ bilateral. No Babinski or clonus noted bilateral.   Musculoskeletal: No gross boney pedal deformities bilateral. No pain, crepitus, or limitation noted with foot and ankle range of motion bilateral. Muscular strength 5/5 in all groups tested bilateral.  Hammertoe deformity mallet toe deformities bilateral left greater than right.  Gait: Unassisted, Nonantalgic.    Radiographs:  Radiographs taken today demonstrate osseously mature individual with good mineralization.  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  Assessment & Plan:   Assessment: History of gouty tophus fourth toe right foot.  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  Plan: Discussed etiology pathology conservative versus surgical therapies.  Placed padding discussed appropriate shoe gear out of stretcher shoes etc. I will follow-up with him on an as-needed basis I did express to him that the only way to fix this would be a surgical correction.     Joshua Dominguez, [place]

## 2023-01-08 ENCOUNTER — Other Ambulatory Visit: Payer: Self-pay | Admitting: Family

## 2023-01-08 DIAGNOSIS — G629 Polyneuropathy, unspecified: Secondary | ICD-10-CM

## 2023-01-11 DIAGNOSIS — Z961 Presence of intraocular lens: Secondary | ICD-10-CM | POA: Diagnosis not present

## 2023-01-11 DIAGNOSIS — H353122 Nonexudative age-related macular degeneration, left eye, intermediate dry stage: Secondary | ICD-10-CM | POA: Diagnosis not present

## 2023-01-11 DIAGNOSIS — H43813 Vitreous degeneration, bilateral: Secondary | ICD-10-CM | POA: Diagnosis not present

## 2023-01-11 DIAGNOSIS — H353111 Nonexudative age-related macular degeneration, right eye, early dry stage: Secondary | ICD-10-CM | POA: Diagnosis not present

## 2023-01-13 ENCOUNTER — Other Ambulatory Visit: Payer: Self-pay | Admitting: Family

## 2023-01-13 DIAGNOSIS — G629 Polyneuropathy, unspecified: Secondary | ICD-10-CM

## 2023-01-19 ENCOUNTER — Other Ambulatory Visit: Payer: Self-pay | Admitting: Family

## 2023-01-19 DIAGNOSIS — G629 Polyneuropathy, unspecified: Secondary | ICD-10-CM

## 2023-01-23 ENCOUNTER — Other Ambulatory Visit: Payer: Self-pay | Admitting: Family

## 2023-01-23 ENCOUNTER — Telehealth: Payer: Self-pay | Admitting: Family

## 2023-01-23 ENCOUNTER — Encounter: Payer: Self-pay | Admitting: Family

## 2023-01-23 DIAGNOSIS — G629 Polyneuropathy, unspecified: Secondary | ICD-10-CM

## 2023-01-23 MED ORDER — AMITRIPTYLINE HCL 50 MG PO TABS: 50.0000 mg | ORAL_TABLET | Freq: Every day | ORAL | 3 refills | Status: DC

## 2023-01-23 NOTE — Telephone Encounter (Signed)
Prescription Request  01/23/2023  LOV: 09/08/2022  What is the name of the medication or equipment? amitriptyline (ELAVIL) 50 MG tablet  Have you contacted your pharmacy to request a refill? Yes   Which pharmacy would you like this sent to?   [id number] - [place], [place] - [address] [place] [address] Phone: [phone] Fax: [phone]    Patient notified that their request is being sent to the clinical staff for review and that they should receive a response within 2 business days.   Please advise at Mobile [phone] (mobile)

## 2023-01-29 ENCOUNTER — Ambulatory Visit: Payer: PPO | Admitting: Dermatology

## 2023-02-26 ENCOUNTER — Ambulatory Visit: Payer: PPO | Admitting: Dermatology

## 2023-02-26 ENCOUNTER — Encounter: Payer: Self-pay | Admitting: Dermatology

## 2023-02-26 VITALS — BP 135/80 | HR 61

## 2023-02-26 DIAGNOSIS — L578 Other skin changes due to chronic exposure to nonionizing radiation: Secondary | ICD-10-CM

## 2023-02-26 DIAGNOSIS — L820 Inflamed seborrheic keratosis: Secondary | ICD-10-CM

## 2023-02-26 DIAGNOSIS — L219 Seborrheic dermatitis, unspecified: Secondary | ICD-10-CM

## 2023-02-26 DIAGNOSIS — L719 Rosacea, unspecified: Secondary | ICD-10-CM | POA: Diagnosis not present

## 2023-02-26 MED ORDER — HYDROCORTISONE 2.5 % EX LOTN: TOPICAL_LOTION | CUTANEOUS | 2 refills | Status: AC

## 2023-02-26 NOTE — Progress Notes (Signed)
Follow-Up Visit   Subjective  Joshua Dominguez is a 82 y.o. male who presents for the following: 6 month AK follow up. Nose. Tx with LN2 at last visit. Used 5FU/Calcipotriene as directed- got good reaction.  Rosacea recheck. Uses metrogel prn.   The following portions of the chart were reviewed this encounter and updated as appropriate: medications, allergies, medical history  Review of Systems:  No other skin or systemic complaints except as noted in HPI or Assessment and Plan.  Objective  Well appearing patient in no apparent distress; mood and affect are within normal limits.  A focused examination was performed of the following areas: Face, nose  Relevant exam findings are noted in the Assessment and Plan.    Assessment & Plan    ACTINIC DAMAGE - chronic, secondary to cumulative UV radiation exposure/sun exposure over time - diffuse scaly erythematous macules with underlying dyspigmentation - Recommend daily broad spectrum sunscreen SPF 30+ to sun-exposed areas, reapply every 2 hours as needed.  - Recommend staying in the shade or wearing long sleeves, sun glasses (UVA+UVB protection) and wide brim hats (4-inch brim around the entire circumference of the hat). - Call for new or changing lesions.   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 Mild erythema at nose and cheeks.   Chronic and persistent condition with duration or expected duration over one year. Condition is symptomatic/ bothersome to patient. Not currently at goal.   Rosacea is a chronic progressive skin condition usually affecting the face of adults, causing redness and/or acne bumps. It is treatable but not curable. It sometimes affects the eyes (ocular rosacea) as well. It may respond to topical and/or systemic medication and can flare with stress, sun exposure, alcohol, exercise, topical steroids (including hydrocortisone/cortisone 10) and some foods.  Daily  application of broad spectrum spf 30+ sunscreen to face is recommended to reduce flares.  Patient denies grittiness of the eyes  Treatment Plan Continue Metrogel 1% daily as directed.   INFLAMED SEBORRHEIC KERATOSIS Exam: Erythematous keratotic or waxy stuck-on papule or plaque.  Symptomatic, irritating, patient would like treated.  Benign-appearing.  Call clinic for new or changing lesions.   Prior to procedure, discussed risks of blister formation, small wound, skin dyspigmentation, or rare scar following treatment. Recommend Vaseline ointment to treated areas while healing.  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 Condition is symptomatic/ bothersome to patient. Not currently at goal.   Seborrheic Dermatitis is a chronic persistent rash characterized by pinkness and scaling most commonly of the mid face but also can occur on the scalp (dandruff), ears; mid chest, mid back and groin.  It tends to be exacerbated by stress and cooler weather.  People who have neurologic disease may experience new onset or exacerbation of existing seborrheic dermatitis.  The condition is not curable but treatable and can be controlled.  Treatment Plan: Start Head & Shoulders (pyrithione zinc) shampoo: lather on face and ears, leave on 5-8 minutes, wash off.    Start Hydrocortisone 2.5%  lotion once or twice daily  to affected areas on face/hairline up to 1 week as needed.  Return in about 6 months (around 08/28/2023) for AK Follow Up, UBSE.  I,  Lawson Radar, CMA, am acting as scribe for Willeen Niece, MD.   Documentation: I have reviewed the above documentation for accuracy and completeness, and I agree with the above.  Willeen Niece, MD

## 2023-02-26 NOTE — Patient Instructions (Addendum)
Continue Metrogel 1% daily as directed for rosacea.    Start Head & Shoulders (pyrithione zinc) shampoo: lather on face and ears, leave on 5-8 minutes, wash off.    Start Hydrocortisone 2.5%  lotion once or twice daily to affected areas on face up to 1 week as needed.   Cryotherapy Aftercare  Wash gently with soap and water everyday.   Apply Vaseline Jelly daily until healed.     Recommend daily broad spectrum sunscreen SPF 30+ to sun-exposed areas, reapply every 2 hours as needed. Call for new or changing lesions.  Staying in the shade or wearing long sleeves, sun glasses (UVA+UVB protection) and wide brim hats (4-inch brim around the entire circumference of the hat) are also recommended for sun protection.    Due to recent changes in healthcare laws, you may see results of your pathology and/or laboratory studies on MyChart before the doctors have had a chance to review them. We understand that in some cases there may be results that are confusing or concerning to you. Please understand that not all results are received at the same time and often the doctors may need to interpret multiple results in order to provide you with the best plan of care or course of treatment. Therefore, we ask that you please give [place] 2 business days to thoroughly review all your results before contacting the office for clarification. Should we see a critical lab result, you will be contacted sooner.   If You Need Anything After Your Visit  If you have any questions or concerns for your doctor, please call our main line at [phone] and press option 4 to reach your doctor's medical assistant. If no one answers, please leave a voicemail as directed and we will return your call as soon as possible. Messages left after 4 pm will be answered the following business day.   You may also send [place] a message via MyChart. We typically respond to MyChart messages within 1-2 business days.  For prescription refills,  please ask your pharmacy to contact our office. Our fax number is [phone].  If you have an urgent issue when the clinic is closed that cannot wait until the next business day, you can page your doctor at the number below.    Please note that while we do our best to be available for urgent issues outside of office hours, we are not available 24/7.   If you have an urgent issue and are unable to reach [place], you may choose to seek medical care at your doctor's office, retail clinic, urgent care center, or emergency room.  If you have a medical emergency, please immediately call 911 or go to the emergency department.  Pager Numbers  - Dr. Gwen Pounds: [phone]  - Dr. Neale Burly: [phone]  - Dr. Roseanne Reno: [phone]  In the event of inclement weather, please call our main line at [phone] for an update on the status of any delays or closures.  Dermatology Medication Tips: Please keep the boxes that topical medications come in in order to help keep track of the instructions about where and how to use these. Pharmacies typically print the medication instructions only on the boxes and not directly on the medication tubes.   If your medication is too expensive, please contact our office at [phone] option 4 or send [place] a message through MyChart.   We are unable to tell what your co-pay for medications will be in advance as this is different depending on your insurance  coverage. However, we may be able to find a substitute medication at lower cost or fill out paperwork to get insurance to cover a needed medication.   If a prior authorization is required to get your medication covered by your insurance company, please allow [place] 1-2 business days to complete this process.  Drug prices often vary depending on where the prescription is filled and some pharmacies may offer cheaper prices.  The website www.goodrx.com contains coupons for medications through different pharmacies. The prices  here do not account for what the cost may be with help from insurance (it may be cheaper with your insurance), but the website can give you the price if you did not use any insurance.  - You can print the associated coupon and take it with your prescription to the pharmacy.  - You may also stop by our office during regular business hours and pick up a GoodRx coupon card.  - If you need your prescription sent electronically to a different pharmacy, notify our office through [facility] or by phone at [phone] option 4.     Si Usted Necesita Algo Despus de Su Visita  Tambin puede enviarnos un mensaje a travs de Clinical cytogeneticist. Por lo general respondemos a los mensajes de MyChart en el transcurso de 1 a 2 das hbiles.  Para renovar recetas, por favor pida a su farmacia que se ponga en contacto con nuestra oficina. Annie Sable de fax es [place] [phone].  Si tiene un asunto urgente cuando la clnica est cerrada y que no puede esperar hasta el siguiente da hbil, puede llamar/localizar a su doctor(a) al nmero que aparece a continuacin.   Por favor, tenga en cuenta que aunque hacemos todo lo posible para estar disponibles para asuntos urgentes fuera del horario de [place], no estamos disponibles las 24 horas del da, los 7 [address]  [address] de la [place].   Si tiene un problema urgente y no puede comunicarse con nosotros, puede optar por buscar atencin mdica  en el consultorio de su doctor(a), en una clnica privada, en un centro de atencin urgente o en una sala de emergencias.  Si tiene Engineer, drilling, por favor llame inmediatamente al 911 o vaya a la sala de emergencias.  Nmeros de bper  - Dr. Gwen Pounds: [phone]  - Dra. Moye: [phone]  - Dra. Roseanne Reno: [phone]  En caso de inclemencias del [place], por favor llame a Lacy Duverney principal al [phone] para una actualizacin sobre el [place] de cualquier retraso o cierre.  Consejos para la medicacin en  dermatologa: Por favor, guarde las cajas en las que vienen los medicamentos de uso tpico para ayudarle a seguir las instrucciones sobre dnde y cmo usarlos. Las farmacias generalmente imprimen las instrucciones del medicamento slo en las cajas y no directamente en los tubos del [place].   Si su medicamento es muy caro, por favor, pngase en contacto con Rolm Gala llamando al [phone] y presione la opcin 4 o envenos un mensaje a travs de Clinical cytogeneticist.   No podemos decirle cul ser su copago por los medicamentos por adelantado ya que esto es diferente dependiendo de la cobertura de su seguro. Sin embargo, es posible que podamos encontrar un medicamento sustituto a Audiological scientist un formulario para que el seguro cubra el medicamento que se considera necesario.   Si se requiere una autorizacin previa para que su compaa de seguros [place] su medicamento, por favor permtanos de 1 a 2 das hbiles para completar [address]  Los precios  de los medicamentos varan con frecuencia dependiendo del lugar de dnde se surte la receta y alguna farmacias pueden ofrecer precios ms baratos.  El sitio web www.goodrx.com tiene cupones para medicamentos de Airline pilot. Los precios aqu no tienen en cuenta lo que podra costar con la ayuda del seguro (puede ser ms barato con su seguro), pero el sitio web puede darle el precio si no utiliz Research scientist (physical sciences).  - Puede imprimir el cupn correspondiente y llevarlo con su receta a la farmacia.  - Tambin puede pasar por nuestra oficina durante el horario de atencin regular y Charity fundraiser una tarjeta de cupones de GoodRx.  - Si necesita que su receta se enve electrnicamente a una farmacia diferente, informe a nuestra oficina a travs de MyChart de [place] o por telfono llamando al [phone] y presione la opcin 4.

## 2023-02-27 ENCOUNTER — Telehealth: Payer: Self-pay | Admitting: Family

## 2023-02-27 NOTE — Telephone Encounter (Signed)
Pt need a refill on Febuxostat sent to [facility]. Pt has changed pharmacy

## 2023-02-28 ENCOUNTER — Other Ambulatory Visit: Payer: Self-pay | Admitting: Family

## 2023-02-28 ENCOUNTER — Encounter: Payer: Self-pay | Admitting: Family

## 2023-02-28 DIAGNOSIS — M109 Gout, unspecified: Secondary | ICD-10-CM

## 2023-02-28 NOTE — Progress Notes (Signed)
close

## 2023-02-28 NOTE — Telephone Encounter (Signed)
Note I didn't refill febuxostat yet Sent mychart message to pt

## 2023-03-20 ENCOUNTER — Telehealth: Payer: Self-pay | Admitting: Family

## 2023-03-20 DIAGNOSIS — I700 Atherosclerosis of aorta: Secondary | ICD-10-CM

## 2023-03-20 NOTE — Telephone Encounter (Signed)
Pt called in staying that he needs to scheduled an MRI? And he was told to call here and that he has an referral for it. I did not see that in his chart. Pt would like a phone call regarding this.

## 2023-03-21 NOTE — Telephone Encounter (Signed)
Spoke to pt and he just needs to get referral for Calcium cardiac scan

## 2023-03-22 ENCOUNTER — Encounter: Payer: Self-pay | Admitting: Family

## 2023-03-22 DIAGNOSIS — G4733 Obstructive sleep apnea (adult) (pediatric): Secondary | ICD-10-CM | POA: Diagnosis not present

## 2023-03-22 NOTE — Telephone Encounter (Signed)
Noted Ordered CT calcium score and sent pt mychart message

## 2023-03-23 ENCOUNTER — Telehealth: Payer: Self-pay | Admitting: Family

## 2023-03-23 NOTE — Telephone Encounter (Signed)
Lft pt vm to call ofc to sch CT. thanks 

## 2023-04-03 ENCOUNTER — Other Ambulatory Visit: Payer: PPO

## 2023-04-06 ENCOUNTER — Ambulatory Visit
Admission: RE | Admit: 2023-04-06 | Discharge: 2023-04-06 | Disposition: A | Discharge status: Home / Self Care | Payer: PPO | Source: Ambulatory Visit | Attending: Family | Admitting: Family

## 2023-04-06 DIAGNOSIS — I700 Atherosclerosis of aorta: Secondary | ICD-10-CM | POA: Insufficient documentation

## 2023-04-17 ENCOUNTER — Telehealth: Payer: Self-pay | Admitting: Family

## 2023-04-17 NOTE — Telephone Encounter (Signed)
Patient called and would like a referral sent to [facility] for PT on his rt shoulder.

## 2023-04-17 NOTE — Telephone Encounter (Signed)
[facility] called back to provide fax number for the orders to be sent in. The fax number its [phone] and its for PT rehab.

## 2023-04-18 NOTE — Telephone Encounter (Signed)
LVM to inform pt that  I have not evaluated patient for right shoulder pain that I recall or see in my notes.  Please advise appointment so that I may place referral

## 2023-04-19 ENCOUNTER — Telehealth: Payer: Self-pay

## 2023-04-19 NOTE — Telephone Encounter (Signed)
LVM to call back to office to go over results 

## 2023-04-20 NOTE — Telephone Encounter (Signed)
LVM to inform pt that he needs appt to be evaluated before I can place referral

## 2023-04-24 NOTE — Telephone Encounter (Signed)
Spoke to pt and scheduled an appt for 04/27/23

## 2023-04-24 NOTE — Telephone Encounter (Signed)
LVM to call back to schedule appt to get referral

## 2023-04-27 ENCOUNTER — Ambulatory Visit (INDEPENDENT_AMBULATORY_CARE_PROVIDER_SITE_OTHER): Payer: PPO | Admitting: Family

## 2023-04-27 ENCOUNTER — Encounter: Payer: Self-pay | Admitting: Family

## 2023-04-27 ENCOUNTER — Telehealth: Payer: Self-pay | Admitting: Family

## 2023-04-27 VITALS — BP 118/80 | HR 63 | Temp 97.4°F | Ht 77.0 in | Wt 255.2 lb

## 2023-04-27 DIAGNOSIS — K7689 Other specified diseases of liver: Secondary | ICD-10-CM | POA: Diagnosis not present

## 2023-04-27 DIAGNOSIS — I700 Atherosclerosis of aorta: Secondary | ICD-10-CM | POA: Diagnosis not present

## 2023-04-27 DIAGNOSIS — N281 Cyst of kidney, acquired: Secondary | ICD-10-CM

## 2023-04-27 DIAGNOSIS — G8929 Other chronic pain: Secondary | ICD-10-CM

## 2023-04-27 DIAGNOSIS — M25511 Pain in right shoulder: Secondary | ICD-10-CM

## 2023-04-27 MED ORDER — CYCLOBENZAPRINE HCL 5 MG PO TABS
5.0000 mg | ORAL_TABLET | Freq: Every evening | ORAL | 0 refills | Status: DC | PRN
Start: 2023-04-27 — End: 2023-10-29

## 2023-04-27 NOTE — Patient Instructions (Signed)
I prescribed flexeril to use as needed.  Do not drive or operate heavy machinery while on muscle relaxant. Please do not drink alcohol. Only take this medication as needed for acute muscle spasm at bedtime. This medication make you feel drowsy so be very careful.  Stop taking if become too drowsy or somnolent as this puts you at risk for falls. Please contact our office with any questions.   Please consider scheduling Tylenol arthritis , one tablet in the morning.   I have ordered ultrasound kidneys, liver and also placed a referral to orthopedics  Let us know if you dont hear back within a week in regards to an appointment being scheduled.   So that you are aware, if you are [facility] MyChart user , please pay attention to your MyChart messages as you may receive a MyChart message with a phone number to call and schedule this test/appointment own your own from our referral coordinator. This is a new process so I do not want you to miss this message.  If you are not a MyChart user, you will receive a phone call.

## 2023-04-27 NOTE — Telephone Encounter (Signed)
Lft pt vm to call ofc to sch US's . Thanks

## 2023-04-27 NOTE — Assessment & Plan Note (Signed)
Anticipate arthritic changes of the right shoulder.  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  Patient and I agreed orthopedics would be an appropriate next step.  We jointly agreed to defer x-ray in our office today to allow orthopedics to order. Patient would likely be good candidate for corticosteroid injection.  Discussed scheduling Tylenol arthritis.  Provided him with Flexeril 5 to 10 mg to use as needed nightly.  Counseled him on the sedating properties of Flexeril.

## 2023-04-27 NOTE — Assessment & Plan Note (Signed)
CT calcium score Coronary calcium score of 171. This was 35th percentile for age and sex matched control.   CAC 1-99 in LAD. CAC-DRS A1/N1.  I encouraged patient to have a consult cardiology to ensure they would not recommend changes to therapy such as increasing Crestor.  Patient politely declines at this time.  Encouraged patient to continue  active lifestyle.  Will continue to monitor.

## 2023-04-27 NOTE — Progress Notes (Unsigned)
Assessment & Plan:  Chronic right shoulder pain Assessment & Plan: Anticipate arthritic changes of the right shoulder.  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  Patient and I agreed orthopedics would be an appropriate next step.  We jointly agreed to defer x-ray in our office today to allow orthopedics to order. Patient would likely be good candidate for corticosteroid injection.  Discussed scheduling Tylenol arthritis.  Provided him with Flexeril 5 to 10 mg to use as needed nightly.  Counseled him on the sedating properties of Flexeril.   Orders: -     Ambulatory referral to Orthopedic Surgery -     Cyclobenzaprine HCl; Take 1-2 tablets (5-10 mg total) by mouth at bedtime as needed for muscle spasms.  Dispense: 15 tablet; Refill: 0  Liver cyst -     US ABDOMEN LIMITED RUQ (LIVER/GB); Future  Renal cyst -     US RENAL; Future  Atherosclerosis of aorta (HCC) Assessment & Plan: CT calcium score Coronary calcium score of 171. This was 35th percentile for age and sex matched control.   CAC 1-99 in LAD. CAC-DRS A1/N1.  I encouraged patient to have a consult cardiology to ensure they would not recommend changes to therapy such as increasing Crestor.  Patient politely declines at this time.  Encouraged patient to continue  active lifestyle.  Will continue to monitor.       Return precautions given.   Risks, benefits, and alternatives of the medications and treatment plan prescribed today were discussed, and patient expressed understanding.   Education regarding symptom management and diagnosis given to patient on AVS either electronically or printed.  Return in about 6 months (around 10/27/2023).  Joshua Plowman, FNP  Subjective:    Patient ID: Joshua Dominguez, male    DOB: [birth year], 82 y.o.   MRN: [id number]  CC: Joshua Dominguez is a 82 y.o. male who presents today for an acute visit.    HPI: Complains of chronic right shoulder pain, waxes and wanes.   Describes  as an ache  He is doing weight training 3 days per week and cardio by swimming /bike 3 days per week; he hikes one day per week.   No injury.   He would like physical therapy at [facility]   No numbness in arm  Neck pain is not bothersome.   Uncomfortable to sleep on right side.   Right handed.   He played tennis, football, baseball in the past.   He will take advil with relief; tylenol arthritis with less effect.      Denies chest pain, shortness of breath, fatigue  Allergies: Celebrex [celecoxib] and Oxymetazoline hcl Current Outpatient Medications on File Prior to Visit  Medication Sig Dispense Refill   amitriptyline (ELAVIL) 50 MG tablet Take 1 tablet (50 mg total) by mouth at bedtime. 90 tablet 3   Febuxostat 80 MG TABS TAKE 1 TABLET(80 MG) BY MOUTH DAILY 90 tablet 1   gabapentin (NEURONTIN) 300 MG capsule TAKE 2 CAPSULES BY MOUTH EVERY MORNING AND TAKE 4 CAPSULES AT BEDTIME AS DIRECTED 540 capsule 3   glucosamine-chondroitin 500-400 MG tablet Take 1 tablet by mouth 2 (two) times daily.     hydrocortisone 2.5 % lotion once or twice daily to affected areas on face up to 1 week as needed. 60 mL 2   metroNIDAZOLE (METROGEL) 1 % gel Apply topically at bedtime. Qhs to face for Rosacea 45 g 11   Multiple Vitamin (MULTIVITAMIN) tablet Take 1 tablet by  mouth daily.     Omega-3 Fatty Acids (FISH OIL) 1200 MG CAPS Take 1 capsule by mouth 2 (two) times daily.     rosuvastatin (CRESTOR) 5 MG tablet TAKE 1 TABLET(5 MG) BY MOUTH DAILY 90 tablet 3   No current facility-administered medications on file prior to visit.    Review of Systems  Constitutional:  Negative for chills and fever.  Respiratory:  Negative for cough.   Cardiovascular:  Negative for chest pain and palpitations.  Gastrointestinal:  Negative for nausea and vomiting.  Musculoskeletal:  Positive for arthralgias. Negative for back pain and joint swelling.  Neurological:  Positive for numbness (chronic, lower  extremities).      Objective:    BP 118/80   Pulse 63   Temp (!) 97.4 F (36.3 C) (Oral)   Ht 6\' 5"  (1.956 m)   Wt 255 lb 3.2 oz (115.8 kg)   SpO2 97%   BMI 30.26 kg/m   BP Readings from Last 3 Encounters:  04/27/23 118/80  02/26/23 135/80  09/08/22 116/70   Wt Readings from Last 3 Encounters:  04/27/23 255 lb 3.2 oz (115.8 kg)  09/08/22 258 lb 12.8 oz (117.4 kg)  08/16/22 246 lb (111.6 kg)    Physical Exam Vitals reviewed.  Constitutional:      Appearance: He is well-developed.  Cardiovascular:     Rate and Rhythm: Regular rhythm.     Heart sounds: Normal heart sounds.  Pulmonary:     Effort: Pulmonary effort is normal. No respiratory distress.     Breath sounds: Normal breath sounds. No wheezing, rhonchi or rales.  Musculoskeletal:     Right shoulder: No swelling or bony tenderness. Decreased range of motion. Normal strength. Normal pulse.     Left shoulder: No swelling. Normal range of motion.     Cervical back: No swelling, tenderness, bony tenderness or crepitus. No pain with movement.     Comments: Right Shoulder:   No asymmetry of shoulders when comparing right and left.No pain with palpation over glenohumeral joint lines, Holland joint, AC joint, or bicipital groove. Decreased ROM with  internal rotation. No pain with resisted lateral extension .   Negative active painful arc sign. Negative passive arc ( Neer's). Negative drop arm.  Strength and sensation normal BUE's.   Skin:    General: Skin is warm and dry.  Neurological:     Mental Status: He is alert.  Psychiatric:        Speech: Speech normal.        Behavior: Behavior normal.

## 2023-04-30 ENCOUNTER — Telehealth: Payer: Self-pay | Admitting: Family

## 2023-04-30 NOTE — Telephone Encounter (Signed)
Lft pt vm to call ofc to sch . thanks 

## 2023-05-09 DIAGNOSIS — M7541 Impingement syndrome of right shoulder: Secondary | ICD-10-CM | POA: Diagnosis not present

## 2023-05-09 DIAGNOSIS — M19011 Primary osteoarthritis, right shoulder: Secondary | ICD-10-CM | POA: Diagnosis not present

## 2023-05-15 ENCOUNTER — Other Ambulatory Visit: Payer: Self-pay | Admitting: Family

## 2023-05-15 ENCOUNTER — Ambulatory Visit: Admission: RE | Admit: 2023-05-15 | Payer: PPO | Source: Ambulatory Visit

## 2023-05-15 ENCOUNTER — Encounter: Payer: Self-pay | Admitting: Family

## 2023-05-15 ENCOUNTER — Ambulatory Visit
Admission: RE | Admit: 2023-05-15 | Discharge: 2023-05-15 | Disposition: A | Discharge status: Home / Self Care | Payer: PPO | Source: Ambulatory Visit | Attending: Family | Admitting: Family

## 2023-05-15 DIAGNOSIS — K7689 Other specified diseases of liver: Secondary | ICD-10-CM | POA: Diagnosis not present

## 2023-05-15 DIAGNOSIS — N281 Cyst of kidney, acquired: Secondary | ICD-10-CM | POA: Insufficient documentation

## 2023-05-15 DIAGNOSIS — M1A9XX0 Chronic gout, unspecified, without tophus (tophi): Secondary | ICD-10-CM

## 2023-05-15 MED ORDER — FEBUXOSTAT 80 MG PO TABS: 80.0000 mg | ORAL_TABLET | Freq: Every day | ORAL | 3 refills | Status: DC

## 2023-05-16 ENCOUNTER — Other Ambulatory Visit: Payer: Self-pay

## 2023-05-16 ENCOUNTER — Ambulatory Visit
Admission: RE | Admit: 2023-05-16 | Discharge: 2023-05-16 | Disposition: A | Discharge status: Home / Self Care | Payer: PPO | Source: Ambulatory Visit | Attending: Family | Admitting: Family

## 2023-05-16 DIAGNOSIS — K7689 Other specified diseases of liver: Secondary | ICD-10-CM | POA: Diagnosis not present

## 2023-05-16 DIAGNOSIS — M109 Gout, unspecified: Secondary | ICD-10-CM

## 2023-05-16 NOTE — Telephone Encounter (Signed)
Spoke to pt informed him refill sent in to pharmacy and scheduled lab appt also

## 2023-05-18 ENCOUNTER — Telehealth: Payer: Self-pay | Admitting: Family

## 2023-05-18 DIAGNOSIS — M1A9XX0 Chronic gout, unspecified, without tophus (tophi): Secondary | ICD-10-CM

## 2023-05-18 NOTE — Telephone Encounter (Signed)
Spoke with pt to let him know that the medicaiton was refilled on 05/15/2023. Pt stated that he has gotten conflicting messages from the  pharmacy. He stated that he received one that said rx was ready and one that said it was out of stock. Pt was advised that he would need to call the pharmacy and find out. If they don't have it pt was advised to call us back so we can send to a different pharmacy.

## 2023-05-18 NOTE — Telephone Encounter (Signed)
Patient called stating [facility] had not received his Febuxostat 80 MG TABS . Patient stated he has to have this medication filled he can't do without it.

## 2023-05-21 ENCOUNTER — Telehealth: Payer: Self-pay

## 2023-05-21 NOTE — Telephone Encounter (Signed)
Pt returned Shanda Bumps CMA call. Note below was read to him. Pt aware and understood.

## 2023-05-21 NOTE — Telephone Encounter (Signed)
LMTCB in regards to lab results.  

## 2023-05-21 NOTE — Telephone Encounter (Signed)
-----   Message from Eulis Foster, FNP sent at 05/20/2023  4:18 PM EDT ----- Ultrasound is normal

## 2023-05-23 ENCOUNTER — Encounter: Payer: Self-pay | Admitting: Family

## 2023-05-23 DIAGNOSIS — K7689 Other specified diseases of liver: Secondary | ICD-10-CM | POA: Insufficient documentation

## 2023-05-24 ENCOUNTER — Telehealth: Payer: Self-pay | Admitting: Family

## 2023-05-24 NOTE — Telephone Encounter (Signed)
Patient need lab orders.

## 2023-05-29 ENCOUNTER — Other Ambulatory Visit (INDEPENDENT_AMBULATORY_CARE_PROVIDER_SITE_OTHER): Payer: PPO

## 2023-05-29 DIAGNOSIS — M109 Gout, unspecified: Secondary | ICD-10-CM

## 2023-05-29 NOTE — Addendum Note (Signed)
Addended by: Warden Fillers on: 05/29/2023 02:20 PM   Modules accepted: Orders

## 2023-05-30 LAB — URIC ACID: Uric Acid, Serum: 3.3 mg/dL — ABNORMAL LOW (ref 4.0–7.8)

## 2023-07-09 DIAGNOSIS — G629 Polyneuropathy, unspecified: Secondary | ICD-10-CM | POA: Diagnosis not present

## 2023-07-09 DIAGNOSIS — F419 Anxiety disorder, unspecified: Secondary | ICD-10-CM | POA: Diagnosis not present

## 2023-07-09 DIAGNOSIS — M109 Gout, unspecified: Secondary | ICD-10-CM | POA: Diagnosis not present

## 2023-07-09 DIAGNOSIS — G4733 Obstructive sleep apnea (adult) (pediatric): Secondary | ICD-10-CM | POA: Diagnosis not present

## 2023-07-09 DIAGNOSIS — E785 Hyperlipidemia, unspecified: Secondary | ICD-10-CM | POA: Diagnosis not present

## 2023-07-31 ENCOUNTER — Telehealth: Payer: Self-pay | Admitting: Family

## 2023-07-31 DIAGNOSIS — G4733 Obstructive sleep apnea (adult) (pediatric): Secondary | ICD-10-CM

## 2023-07-31 NOTE — Telephone Encounter (Signed)
Pt called stating he need a traveling cpap and he need arnett to approve it through his insurance

## 2023-08-02 DIAGNOSIS — G4733 Obstructive sleep apnea (adult) (pediatric): Secondary | ICD-10-CM | POA: Diagnosis not present

## 2023-08-02 NOTE — Addendum Note (Signed)
Addended by: Allegra Grana on: 08/02/2023 04:01 PM   Modules accepted: Orders

## 2023-08-02 NOTE — Telephone Encounter (Signed)
Lvm for pt to call office back in regards to Cpap. Order has to go to the DME company he is currently using. We need that information so we can fax the order over to them. The DME company has to reach out to [facility] if his insurance is in network. [facility] doesn't accept prescription orders

## 2023-08-02 NOTE — Telephone Encounter (Signed)
I have ordered DME for travel cipap, put in jenate's chair [facility] has traveling cpap machine called CPAP Mini   Contact - use link below and look for contact info. You will  need to call and/or fax to [employer] [place] - [facility]

## 2023-08-07 NOTE — Telephone Encounter (Signed)
Order was faxed on 08/07/23 received confirmation as well that it was received

## 2023-08-13 ENCOUNTER — Telehealth: Payer: Self-pay | Admitting: Family

## 2023-08-13 NOTE — Telephone Encounter (Signed)
Copied from CRM [id number]. Topic: Medicare AWV >> Aug 13, 2023  9:48 AM Payton Doughty wrote: Reason for CRM: Called LM 08/13/2023 to schedule AWV   Verlee Rossetti; Care Guide Ambulatory Clinical Support [place] [facility] Direct Dial: [phone]

## 2023-08-21 ENCOUNTER — Ambulatory Visit: Payer: PPO | Admitting: Dermatology

## 2023-08-27 ENCOUNTER — Telehealth: Payer: Self-pay | Admitting: Family

## 2023-08-27 ENCOUNTER — Other Ambulatory Visit: Payer: Self-pay | Admitting: Family

## 2023-08-27 DIAGNOSIS — Z0000 Encounter for general adult medical examination without abnormal findings: Secondary | ICD-10-CM

## 2023-08-27 DIAGNOSIS — G629 Polyneuropathy, unspecified: Secondary | ICD-10-CM

## 2023-08-27 NOTE — Telephone Encounter (Signed)
Copied from CRM [id number]. Topic: Medicare AWV >> Aug 27, 2023  9:23 AM Payton Doughty wrote: Reason for CRM: Called LVM 08/27/2023 to schedule Annual Wellness Visit  Verlee Rossetti; Care Guide Ambulatory Clinical Support [place] [facility] Direct Dial: [phone]

## 2023-08-29 ENCOUNTER — Ambulatory Visit: Payer: PPO

## 2023-08-29 VITALS — BP 130/86 | Temp 97.2°F | Ht 77.0 in | Wt 254.4 lb

## 2023-08-29 DIAGNOSIS — Z0000 Encounter for general adult medical examination without abnormal findings: Secondary | ICD-10-CM | POA: Diagnosis not present

## 2023-08-29 NOTE — Progress Notes (Signed)
Subjective:   Joshua Dominguez is a 82 y.o. male who presents for Medicare Annual/Subsequent preventive examination.  Visit Complete: In person  Cardiac Risk Factors include: obesity (BMI >30kg/m2);advanced age (>47men, >70 women);male gender;dyslipidemia     Objective:    Today's Vitals   08/29/23 0953 08/29/23 0954 08/29/23 0956  BP: 130/86    Temp: (!) 97.2 F (36.2 C)    TempSrc: Skin    Weight: 254 lb 6 oz (115.4 kg)    Height: 6\' 5"  (1.956 m)    PainSc:  2  2    Body mass index is 30.16 kg/m.     08/29/2023   10:12 AM 08/16/2022    1:08 PM 06/09/2021    6:58 AM 05/31/2021   11:48 AM 02/20/2019    9:35 AM 04/17/2014    7:45 AM 04/14/2014    2:11 PM  Advanced Directives  Does Patient Have a Medical Advance Directive? Yes Yes No No Yes Patient has advance directive, copy not in chart Patient has advance directive, copy not in chart  Type of Advance Directive Healthcare Power of [place];Living will Healthcare Power of [place];Living will    Healthcare Power of [place];Living will Healthcare Power of [place];Living will  Does patient want to make changes to medical advance directive?  No - Patient declined   No - Patient declined No change requested   Copy of Healthcare Power of Attorney in Chart? No - copy requested No - copy requested    Copy requested from family   Would patient like information on creating a medical advance directive?   No - Patient declined  No - Patient declined      Current Medications (verified) Outpatient Encounter Medications as of 08/29/2023  Medication Sig   amitriptyline (ELAVIL) 50 MG tablet Take 1 tablet (50 mg total) by mouth at bedtime.   Febuxostat 80 MG TABS Take 1 tablet (80 mg total) by mouth daily.   gabapentin (NEURONTIN) 300 MG capsule TAKE 2 CAPSULES BY MOUTH EVERY MORNING AND TAKE FOUR CAPSULES BY MOUTH EVERY NIGHT AT BEDTIME   glucosamine-chondroitin 500-400 MG tablet Take 1 tablet by mouth 2 (two) times daily.    hydrocortisone 2.5 % lotion once or twice daily to affected areas on face up to 1 week as needed.   metroNIDAZOLE (METROGEL) 1 % gel Apply topically at bedtime. Qhs to face for Rosacea   Multiple Vitamin (MULTIVITAMIN) tablet Take 1 tablet by mouth daily.   Multiple Vitamins-Minerals (PRESERVISION AREDS 2+MULTI VIT PO) Take by mouth daily.   Omega-3 Fatty Acids (FISH OIL) 1200 MG CAPS Take 1 capsule by mouth 2 (two) times daily.   rosuvastatin (CRESTOR) 5 MG tablet TAKE 1 TABLET(5 MG) BY MOUTH DAILY   cyclobenzaprine (FLEXERIL) 5 MG tablet Take 1-2 tablets (5-10 mg total) by mouth at bedtime as needed for muscle spasms. (Patient not taking: Reported on 08/29/2023)   No facility-administered encounter medications on file as of 08/29/2023.    Allergies (verified) Celebrex [celecoxib] and Oxymetazoline hcl   History: Past Medical History:  Diagnosis Date   Allergic rhinitis    Bilateral hydrocele    DDD (degenerative disc disease), lumbar     L3-L4   ED (erectile dysfunction) of organic origin    Gout    Hemorrhoids    History of prostatitis    Lumbar herniated disc 2007   L4 and L5 did not require surgery   Prostate cancer (HCC)    S/P  RADIOACTIVE SEED IMPLANTS 05-09-2013  Past Surgical History:  Procedure Laterality Date   APPENDECTOMY  FEB 2000   CATARACT EXTRACTION W/ INTRAOCULAR LENS  IMPLANT, BILATERAL     GASTROCNEMIUS RECESSION Left 06/09/2021   Procedure: Left gastroc recession;  Surgeon: Toni Arthurs, MD;  Location: [facility];  Service: Orthopedics;  Laterality: Left;   HYDROCELE EXCISION Bilateral 01/02/2014   Procedure: BILATERAL HYDROCELECTOMY;  Surgeon: Garnett Farm, MD;  Location: [facility];  Service: Urology;  Laterality: Bilateral;   HYDROCELE EXCISION Left 04/17/2014   Procedure: LEFT HYDROCELECTOMY ADULT;  Surgeon: Garnett Farm, MD;  Location: [facility];  Service: Urology;  Laterality: Left;   KNEE ARTHROSCOPY  Left    PLANTAR FASCIA RELEASE Left 06/09/2021   Procedure: Plantar fascia release;  Surgeon: Toni Arthurs, MD;  Location: [facility];  Service: Orthopedics;  Laterality: Left;   RADIOACTIVE SEED IMPLANT N/A 05/09/2013   Procedure: RADIOACTIVE SEED IMPLANT;  Surgeon: Garnett Farm, MD;  Location: [facility];  Service: Urology;  Laterality: N/A;   Family History  Problem Relation Age of Onset   Hypertension Father    Gout Father    Prostate cancer Father    Lung cancer Mother        +smoker   Lung cancer Maternal Grandfather        -smoker   Heart disease Sister    Heart disease Brother    Social History   Socioeconomic History   Marital status: Married    Spouse name: Not on file   Number of children: Not on file   Years of education: Not on file   Highest education level: Not on file  Occupational History   Occupation: Consulting/professor  Tobacco Use   Smoking status: Former    Types: Pipe    Quit date: 11/13/1976    Years since quitting: 46.8   Smokeless tobacco: Never  Vaping Use   Vaping status: Never Used  Substance and Sexual Activity   Alcohol use: Yes    Alcohol/week: 14.0 standard drinks of alcohol    Types: 14 Glasses of wine per week   Drug use: No   Sexual activity: Not Currently  Other Topics Concern   Not on file  Social History Narrative   Married      Has consulting job, 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, statistics, occupational behavior      [place], active lifestyle- swims, runs, lifts [employer]   Social Determinants of Health   Financial Resource Strain: Low Risk  (08/29/2023)   Overall Financial Resource Strain (CARDIA)    Difficulty of Paying Living Expenses: Not hard at all  Food Insecurity: No Food Insecurity (08/29/2023)   Hunger Vital Sign    Worried About Running Out of Food in the Last Year: Never  true    Ran Out of Food in the Last Year: Never true  Transportation Needs: No Transportation Needs (08/29/2023)   PRAPARE - Administrator, Civil Service (Medical): No    Lack of Transportation (Non-Medical): No  Physical Activity: Sufficiently Active (08/29/2023)   Exercise Vital Sign    Days of Exercise per Week: 6 days    Minutes of Exercise per Session: 90 min  Stress: No Stress Concern Present (08/29/2023)   [employer] of Occupational Health - Occupational Stress Questionnaire    Feeling of Stress : Not at all  Social Connections: Socially Integrated (08/29/2023)   Social Connection and Isolation Panel [NHANES]    Frequency of Communication with Friends and Family: Three times a week    Frequency of Social Gatherings with Friends and Family: Twice a week    Attends Religious Services: More than 4 times per year    Active Member of [employer] or Organizations: Yes    Attends Engineer, structural: More than 4 times per year    Marital Status: Married    Tobacco Counseling Counseling given: Not Answered   Clinical Intake:  Pre-visit preparation completed: Yes  Pain : 0-10 Pain Score: 2  Pain Type: Chronic pain Pain Location: Foot Pain Orientation: Left Pain Descriptors / Indicators: Dull Pain Onset: More than a month ago Pain Frequency: Intermittent     BMI - recorded: 30.16 Nutritional Status: BMI > 30  Obese Nutritional Risks: None Diabetes: No  How often do you need to have someone help you when you read instructions, pamphlets, or other written materials from your doctor or pharmacy?: 1 - Never  Interpreter Needed?: No  Information entered by :: R. Cristle Jared LPN   Activities of Daily Living    08/29/2023    9:58 AM  In your present state of health, do you have any difficulty performing the following activities:  Hearing? 1  Comment wears aids at times  Vision? 0  Difficulty concentrating or making decisions? 0  Walking or climbing  stairs? 0  Dressing or bathing? 0  Doing errands, shopping? 0  Preparing Food and eating ? N  Using the Toilet? N  In the past six months, have you accidently leaked urine? N  Do you have problems with loss of bowel control? N  Managing your Medications? N  Managing your Finances? N  Housekeeping or managing your Housekeeping? N    Patient Care Team: Allegra Grana, FNP as PCP - General (Family Medicine)  Indicate any recent Medical Services you may have received from other than [facility] providers in the past year (date may be approximate).     Assessment:   This is a routine wellness examination for Joshua Dominguez.  Hearing/Vision screen Hearing Screening - Comments:: Wears aids at times Vision Screening - Comments:: History of cataract surgery   Goals Addressed             This Visit's Progress    Patient Stated       Wants to continue to exercise and achieve his goals       Depression Screen    08/29/2023   10:07 AM 04/27/2023    9:38 AM 09/08/2022    8:38 AM 09/08/2022    8:37 AM 08/16/2022    1:15 PM 08/24/2021    3:10 PM 10/22/2020   10:47 AM  PHQ 2/9 Scores  PHQ - 2 Score 0 0 0 0 0 0 0  PHQ- 9 Score 0  0        Fall Risk    08/29/2023   10:00 AM 04/27/2023    9:38 AM 09/08/2022    8:37 AM 08/16/2022    1:29 PM 10/27/2021    2:09 PM  Fall Risk   Falls in the past year? 0 0 0 0 0  Number falls in past yr: 0 0 0 0 0  Injury with Fall? 0 0 0 0 0  Risk for fall due to : No Fall Risks No Fall Risks No Fall Risks No Fall Risks   Follow up Falls prevention  discussed;Falls evaluation completed Falls evaluation completed Falls evaluation completed Falls evaluation completed     MEDICARE RISK AT HOME: Medicare Risk at Home Any stairs in or around the home?: Yes If so, are there any without handrails?: No Home free of loose throw rugs in walkways, pet beds, electrical cords, etc?: Yes Adequate lighting in your home to reduce risk of falls?: Yes Life alert?:  Yes Use of a cane, walker or w/c?: No Grab bars in the bathroom?: Yes Shower chair or bench in shower?: Yes Elevated toilet seat or a handicapped toilet?: No  TIMED UP AND GO:  Was the test performed?  Yes  Length of time to ambulate 10 feet: 8 sec Gait steady and fast without use of assistive device    Cognitive Function:        08/29/2023   10:12 AM 08/16/2022    1:25 PM  6CIT Screen  What Year? 0 points   What month? 0 points   What time? 0 points   Count back from 20 0 points   Months in reverse 0 points 0 points  Repeat phrase 0 points   Total Score 0 points     Immunizations Immunization History  Administered Date(s) Administered   Fluad Quad(high Dose 65+) 07/08/2019, 08/05/2020, 08/15/2022   Influenza Split 08/22/2011, 06/13/2013   Influenza Whole 08/16/2010   Influenza, High Dose Seasonal PF 07/15/2018, 07/30/2023   Influenza-Unspecified 07/20/2016, 06/27/2017, 08/08/2021, 08/15/2021   Moderna Sars-Covid-2 Vaccination 12/19/2019, 01/16/2020, 09/23/2020, 08/08/2021, 08/15/2022   Pfizer Covid-19 Vaccine Bivalent Booster 19yrs & up 08/15/2021   Pneumococcal Conjugate-13 12/11/2013   Pneumococcal Polysaccharide-23 09/13/2004, 06/14/2010, 11/27/2011   Td 04/18/2005   Tdap 02/01/2016   Zoster Recombinant(Shingrix) 03/28/2018   Zoster, Live 06/05/2011    TDAP status: Up to date  Flu Vaccine status: Up to date  Pneumococcal vaccine status: Up to date  Covid-19 vaccine status: Information provided on how to obtain vaccines.   Qualifies for Shingles Vaccine? Yes   Zostavax completed Yes   Shingrix Completed?: No.    Education has been provided regarding the importance of this vaccine. Patient has been advised to call insurance company to determine out of pocket expense if they have not yet received this vaccine. Advised may also receive vaccine at local pharmacy or Health Dept. Verbalized acceptance and understanding.  Screening Tests Health Maintenance   Topic Date Due   Zoster Vaccines- Shingrix (2 of 2) 05/23/2018   COVID-19 Vaccine (6 - 2023-24 season) 07/15/2023   Medicare Annual Wellness (AWV)  08/28/2024   DTaP/Tdap/Td (3 - Td or Tdap) 01/31/2026   Pneumonia Vaccine 61+ Years old  Completed   INFLUENZA VACCINE  Completed   HPV VACCINES  Aged Out   Hepatitis C Screening  Discontinued   Fecal DNA (Cologuard)  Discontinued    Health Maintenance  Health Maintenance Due  Topic Date Due   Zoster Vaccines- Shingrix (2 of 2) 05/23/2018   COVID-19 Vaccine (6 - 2023-24 season) 07/15/2023    Colorectal cancer screening: No longer required.   Lung Cancer Screening: (Low Dose CT Chest recommended if Age 69-80 years, 20 pack-year currently smoking OR have quit w/in 15years.) does not qualify.   Additional Screening:  Hepatitis C Screening: does not qualify; Completed 08/2021  Vision Screening: Recommended annual ophthalmology exams for early detection of glaucoma and other disorders of the eye. Is the patient up to date with their annual eye exam?  Yes  Who is the provider or what is the name of  the office in which the patient attends annual eye exams? [facility] If pt is not established with a provider, would they like to be referred to a provider to establish care? No .   Dental Screening: Recommended annual dental exams for proper oral hygiene    Community Resource Referral / Chronic Care Management: CRR required this visit?  No   CCM required this visit?  No     Plan:     I have personally reviewed and noted the following in the patient's chart:   Medical and social history Use of alcohol, tobacco or illicit drugs  Current medications and supplements including opioid prescriptions. Patient is not currently taking opioid prescriptions. Functional ability and status Nutritional status Physical activity Advanced directives List of other physicians Hospitalizations, surgeries, and ER visits in previous 12  months Vitals Screenings to include cognitive, depression, and falls Referrals and appointments  In addition, I have reviewed and discussed with patient certain preventive protocols, quality metrics, and best practice recommendations. A written personalized care plan for preventive services as well as general preventive health recommendations were provided to patient.     Sydell Axon, LPN   [id number]   After Visit Summary: (MyChart) Due to this being a telephonic visit, the after visit summary with patients personalized plan was offered to patient via MyChart   Nurse Notes: None

## 2023-08-29 NOTE — Patient Instructions (Signed)
Joshua Dominguez , Thank you for taking time to come for your Medicare Wellness Visit. I appreciate your ongoing commitment to your health goals. Please review the following plan we discussed and let me know if I can assist you in the future.   Referrals/Orders/Follow-Ups/Clinician Recommendations: Remember to get your second shingles vaccine  This is a list of the screening recommended for you and due dates:  Health Maintenance  Topic Date Due   Zoster (Shingles) Vaccine (2 of 2) 05/23/2018   COVID-19 Vaccine (6 - 2023-24 season) 07/15/2023   Medicare Annual Wellness Visit  08/28/2024   DTaP/Tdap/Td vaccine (3 - Td or Tdap) 01/31/2026   Pneumonia Vaccine  Completed   Flu Shot  Completed   HPV Vaccine  Aged Out   Hepatitis C Screening  Discontinued   Cologuard (Stool DNA test)  Discontinued    Advanced directives: (Copy Requested) Please bring a copy of your health care power of attorney and living will to the office to be added to your chart at your convenience.  Next Medicare Annual Wellness Visit scheduled for next year: Yes 09/03/24 @ 12:45

## 2023-09-12 ENCOUNTER — Encounter: Payer: PPO | Admitting: Family

## 2023-10-24 ENCOUNTER — Encounter: Payer: Self-pay | Admitting: Family

## 2023-10-25 ENCOUNTER — Other Ambulatory Visit: Payer: Self-pay

## 2023-10-25 DIAGNOSIS — Z0000 Encounter for general adult medical examination without abnormal findings: Secondary | ICD-10-CM

## 2023-10-25 NOTE — Telephone Encounter (Signed)
LVM to call back to schedule lab appt ahead of his physical on 10/29/23. Orders are in

## 2023-10-29 ENCOUNTER — Encounter: Payer: Self-pay | Admitting: Family

## 2023-10-29 ENCOUNTER — Ambulatory Visit: Payer: PPO | Admitting: Family

## 2023-10-29 VITALS — BP 132/80 | HR 61 | Temp 97.6°F | Ht 77.0 in | Wt 262.6 lb

## 2023-10-29 DIAGNOSIS — I700 Atherosclerosis of aorta: Secondary | ICD-10-CM | POA: Diagnosis not present

## 2023-10-29 DIAGNOSIS — Z23 Encounter for immunization: Secondary | ICD-10-CM | POA: Diagnosis not present

## 2023-10-29 DIAGNOSIS — Z0000 Encounter for general adult medical examination without abnormal findings: Secondary | ICD-10-CM | POA: Diagnosis not present

## 2023-10-29 DIAGNOSIS — M109 Gout, unspecified: Secondary | ICD-10-CM | POA: Diagnosis not present

## 2023-10-29 LAB — LIPID PANEL
Cholesterol: 126 mg/dL (ref 0–200)
HDL: 40 mg/dL (ref >39.00)
LDL Cholesterol: 60 mg/dL (ref 0–99)
NonHDL: 85.74
Total CHOL/HDL Ratio: 3
Triglycerides: 128 mg/dL (ref 0.0–149.0)
VLDL: 25.6 mg/dL (ref 0.0–40.0)

## 2023-10-29 LAB — URIC ACID: Uric Acid, Serum: 3.2 mg/dL — ABNORMAL LOW (ref 4.0–7.8)

## 2023-10-29 LAB — COMPREHENSIVE METABOLIC PANEL
ALT: 29 U/L (ref 0–53)
AST: 29 U/L (ref 0–37)
Albumin: 4.5 g/dL (ref 3.5–5.2)
Alkaline Phosphatase: 76 U/L (ref 39–117)
BUN: 22 mg/dL (ref 6–23)
CO2: 31 meq/L (ref 19–32)
Calcium: 9.2 mg/dL (ref 8.4–10.5)
Chloride: 105 meq/L (ref 96–112)
Creatinine, Ser: 1.13 mg/dL (ref 0.40–1.50)
GFR: 61.41 mL/min (ref >60.00)
Glucose, Bld: 87 mg/dL (ref 70–99)
Potassium: 4.8 meq/L (ref 3.5–5.1)
Sodium: 142 meq/L (ref 135–145)
Total Bilirubin: 0.6 mg/dL (ref 0.2–1.2)
Total Protein: 6.8 g/dL (ref 6.0–8.3)

## 2023-10-29 LAB — CBC WITH DIFFERENTIAL/PLATELET
Basophils Absolute: 0.1 10*3/uL (ref 0.0–0.1)
Basophils Relative: 1.1 % (ref 0.0–3.0)
Eosinophils Absolute: 0.1 10*3/uL (ref 0.0–0.7)
Eosinophils Relative: 2.7 % (ref 0.0–5.0)
HCT: 46.4 % (ref 39.0–52.0)
Hemoglobin: 15.5 g/dL (ref 13.0–17.0)
Lymphocytes Relative: 31.2 % (ref 12.0–46.0)
Lymphs Abs: 1.7 10*3/uL (ref 0.7–4.0)
MCHC: 33.5 g/dL (ref 30.0–36.0)
MCV: 94.6 fL (ref 78.0–100.0)
Monocytes Absolute: 0.5 10*3/uL (ref 0.1–1.0)
Monocytes Relative: 9.9 % (ref 3.0–12.0)
Neutro Abs: 3 10*3/uL (ref 1.4–7.7)
Neutrophils Relative %: 55.1 % (ref 43.0–77.0)
Platelets: 167 10*3/uL (ref 150.0–400.0)
RBC: 4.91 Mil/uL (ref 4.22–5.81)
RDW: 13.4 % (ref 11.5–15.5)
WBC: 5.4 10*3/uL (ref 4.0–10.5)

## 2023-10-29 LAB — HEMOGLOBIN A1C: Hgb A1c MFr Bld: 5.6 % (ref 4.6–6.5)

## 2023-10-29 LAB — TSH: TSH: 3.85 u[IU]/mL (ref 0.35–5.50)

## 2023-10-29 LAB — PSA: PSA: 0.12 ng/mL (ref 0.10–4.00)

## 2023-10-29 NOTE — Patient Instructions (Signed)
Nice to see you!  You may get a second and final dose of Shingrix at your local pharmacy  Health Maintenance, Male Adopting a healthy lifestyle and getting preventive care are important in promoting health and wellness. Ask your health care provider about: The right schedule for you to have regular tests and exams. Things you can do on your own to prevent diseases and keep yourself healthy. What should I know about diet, weight, and exercise? Eat a healthy diet  Eat a diet that includes plenty of vegetables, fruits, low-fat dairy products, and lean protein. Do not eat a lot of foods that are high in solid fats, added sugars, or sodium. Maintain a healthy weight Body mass index (BMI) is a measurement that can be used to identify possible weight problems. It estimates body fat based on height and weight. Your health care provider can help determine your BMI and help you achieve or maintain a healthy weight. Get regular exercise Get regular exercise. This is one of the most important things you can do for your health. Most adults should: Exercise for at least 150 minutes each week. The exercise should increase your heart rate and make you sweat (moderate-intensity exercise). Do strengthening exercises at least twice a week. This is in addition to the moderate-intensity exercise. Spend less time sitting. Even light physical activity can be beneficial. Watch cholesterol and blood lipids Have your blood tested for lipids and cholesterol at 80 years of age, then have this test every 5 years. You may need to have your cholesterol levels checked more often if: Your lipid or cholesterol levels are high. You are older than 80 years of age. You are at high risk for heart disease. What should I know about cancer screening? Many types of cancers can be detected early and may often be prevented. Depending on your health history and family history, you may need to have cancer screening at various ages.  This may include screening for: Colorectal cancer. Prostate cancer. Skin cancer. Lung cancer. What should I know about heart disease, diabetes, and high blood pressure? Blood pressure and heart disease High blood pressure causes heart disease and increases the risk of stroke. This is more likely to develop in people who have high blood pressure readings or are overweight. Talk with your health care provider about your target blood pressure readings. Have your blood pressure checked: Every 3-5 years if you are 44-56 years of age. Every year if you are 68 years old or older. If you are between the ages of 85 and 66 and are a current or former smoker, ask your health care provider if you should have a one-time screening for abdominal aortic aneurysm (AAA). Diabetes Have regular diabetes screenings. This checks your fasting blood sugar level. Have the screening done: Once every three years after age 46 if you are at a normal weight and have a low risk for diabetes. More often and at a younger age if you are overweight or have a high risk for diabetes. What should I know about preventing infection? Hepatitis B If you have a higher risk for hepatitis B, you should be screened for this virus. Talk with your health care provider to find out if you are at risk for hepatitis B infection. Hepatitis C Blood testing is recommended for: Everyone born from [birth year] through 1963. Anyone with known risk factors for hepatitis C. Sexually transmitted infections (STIs) You should be screened each year for STIs, including gonorrhea and chlamydia, if: You are  sexually active and are younger than 80 years of age. You are older than 80 years of age and your health care provider tells you that you are at risk for this type of infection. Your sexual activity has changed since you were last screened, and you are at increased risk for chlamydia or gonorrhea. Ask your health care provider if you are at risk. Ask your  health care provider about whether you are at high risk for HIV. Your health care provider may recommend a prescription medicine to help prevent HIV infection. If you choose to take medicine to prevent HIV, you should first get tested for HIV. You should then be tested every 3 months for as long as you are taking the medicine. Follow these instructions at home: Alcohol use Do not drink alcohol if your health care provider tells you not to drink. If you drink alcohol: Limit how much you have to 0-2 drinks a day. Know how much alcohol is in your drink. In the [place] one drink equals one 12 oz bottle of beer (355 mL), one 5 oz glass of wine (148 mL), or one 1 oz glass of hard liquor (44 mL). Lifestyle Do not use any products that contain nicotine or tobacco. These products include cigarettes, chewing tobacco, and vaping devices, such as e-cigarettes. If you need help quitting, ask your health care provider. Do not use street drugs. Do not share needles. Ask your health care provider for help if you need support or information about quitting drugs. General instructions Schedule regular health, dental, and eye exams. Stay current with your vaccines. Tell your health care provider if: You often feel depressed. You have ever been abused or do not feel safe at home. Summary Adopting a healthy lifestyle and getting preventive care are important in promoting health and wellness. Follow your health care provider's instructions about healthy diet, exercising, and getting tested or screened for diseases. Follow your health care provider's instructions on monitoring your cholesterol and blood pressure. This information is not intended to replace advice given to you by your health care provider. Make sure you discuss any questions you have with your health care provider. Document Revised: 03/21/2021 Document Reviewed: 03/21/2021 Elsevier Patient Education  2024 [employer].

## 2023-10-29 NOTE — Assessment & Plan Note (Signed)
Congratulated patient on diligence to exercise. PCV20 given. Advised Shingrex 2/2 at local pharmacy  He is no longer screening for colon cancer.  Pending PSA lab due to history of prostate cancer, surveillance

## 2023-10-29 NOTE — Progress Notes (Signed)
Assessment & Plan:  Gout, unspecified cause, unspecified chronicity, unspecified site Assessment & Plan: Chronic, stable.  Continue febuxostat 80mg  daily. Uric acid 3.2. I have advised patient via result note that a decrease of from febuxostat 80mg  to 40mg  would likely remain adequate.   Orders: -     Uric acid  Routine physical examination Assessment & Plan: Congratulated patient on diligence to exercise. PCV20 given. Advised Shingrex 2/2 at local pharmacy  He is no longer screening for colon cancer.  Pending PSA lab due to history of prostate cancer, surveillance   Orders: -     Lipid panel -     Hemoglobin A1c -     TSH -     PSA -     Comprehensive metabolic panel -     CBC with Differential/Platelet  Need for pneumococcal 20-valent conjugate vaccination -     Pneumococcal conjugate vaccine 20-valent  Atherosclerosis of aorta (HCC) Assessment & Plan: Symptomatically stable, continue Crestor 5 mg. Lab Results  Component Value Date   LDLCALC 60 10/29/2023          Return precautions given.   Risks, benefits, and alternatives of the medications and treatment plan prescribed today were discussed, and patient expressed understanding.   Education regarding symptom management and diagnosis given to patient on AVS either electronically or printed.  Return for Complete Physical Exam.  [name], FNP  Subjective:    Patient ID: Joshua Dominguez, male    DOB: [birth year], 82 y.o.   MRN: [id number]  CC: Joshua Dominguez is a 82 y.o. male who presents today for physical exam.    HPI: Overall feels well today.  No new complaints.  He is following with neuropathy clinic and started red light therapy a couple weeks ago, reports modest results.Remains compliant with amitriptyline, gabapentin.      Gout has been stable.    Following annually with  Dermatology, Dr Willeen Niece  Colorectal  Cancer Screening: 03/16/2009/ colonoscopy;  Cologuard negative  03/06/19  Prostate Cancer Screening: History of prostate cancer. S/P RADIOACTIVE SEED IMPLANTS 05-09-2013   Lung Cancer Screening: No 30 year pack year history and > 50 years to 80 years.   Due PCV20  Exercise: Gets regular exercise, 6 days per week, TRX, swimming.  Alcohol use: Occasional Smoking/tobacco use: Former smoker, pipe, quit 1978   Health Maintenance  Topic Date Due   Zoster (Shingles) Vaccine (2 of 2) 05/23/2018   COVID-19 Vaccine (6 - 2024-25 season) 07/15/2023   Medicare Annual Wellness Visit  08/28/2024   DTaP/Tdap/Td vaccine (3 - Td or Tdap) 01/31/2026   Pneumonia Vaccine  Completed   Flu Shot  Completed   HPV Vaccine  Aged Out   Hepatitis C Screening  Discontinued   Cologuard (Stool DNA test)  Discontinued     ALLERGIES: Celebrex [celecoxib] and Oxymetazoline hcl  Current Outpatient Medications on File Prior to Visit  Medication Sig Dispense Refill   amitriptyline (ELAVIL) 50 MG tablet Take 1 tablet (50 mg total) by mouth at bedtime. 90 tablet 3   Febuxostat 80 MG TABS Take 1 tablet (80 mg total) by mouth daily. 90 tablet 3   gabapentin (NEURONTIN) 300 MG capsule TAKE 2 CAPSULES BY MOUTH EVERY MORNING AND TAKE FOUR CAPSULES BY MOUTH EVERY NIGHT AT BEDTIME 540 capsule 3   glucosamine-chondroitin 500-400 MG tablet Take 1 tablet by mouth 2 (two) times daily.     hydrocortisone 2.5 % lotion once or twice daily to affected areas  on face up to 1 week as needed. 60 mL 2   metroNIDAZOLE (METROGEL) 1 % gel Apply topically at bedtime. Qhs to face for Rosacea 45 g 11   Multiple Vitamin (MULTIVITAMIN) tablet Take 1 tablet by mouth daily.     Multiple Vitamins-Minerals (PRESERVISION AREDS 2+MULTI VIT PO) Take by mouth daily.     Omega-3 Fatty Acids (FISH OIL) 1200 MG CAPS Take 1 capsule by mouth 2 (two) times daily.     rosuvastatin (CRESTOR) 5 MG tablet TAKE 1 TABLET(5 MG) BY MOUTH DAILY 90 tablet 3   No current facility-administered medications on file prior to visit.     Review of Systems  Constitutional:  Negative for chills and fever.  HENT:  Negative for congestion, ear pain, rhinorrhea, sinus pressure and sore throat.   Respiratory:  Negative for cough, shortness of breath and wheezing.   Cardiovascular:  Negative for chest pain and palpitations.  Gastrointestinal:  Negative for diarrhea, nausea and vomiting.  Genitourinary:  Negative for dysuria.  Musculoskeletal:  Negative for myalgias.  Skin:  Negative for rash.  Neurological:  Positive for numbness. Negative for headaches.  Hematological:  Negative for adenopathy.      Objective:    BP 132/80   Pulse 61   Temp 97.6 F (36.4 C) (Oral)   Ht 6\' 5"  (1.956 m)   Wt 262 lb 9.6 oz (119.1 kg)   SpO2 95%   BMI 31.14 kg/m   BP Readings from Last 3 Encounters:  10/29/23 132/80  08/29/23 130/86  04/27/23 118/80   Wt Readings from Last 3 Encounters:  10/29/23 262 lb 9.6 oz (119.1 kg)  08/29/23 254 lb 6 oz (115.4 kg)  04/27/23 255 lb 3.2 oz (115.8 kg)    Physical Exam Vitals reviewed.  Constitutional:      Appearance: He is well-developed.  Neck:     Thyroid: No thyroid mass or thyromegaly.  Cardiovascular:     Rate and Rhythm: Regular rhythm.     Heart sounds: Normal heart sounds.  Pulmonary:     Effort: Pulmonary effort is normal. No respiratory distress.     Breath sounds: Normal breath sounds. No wheezing, rhonchi or rales.  Lymphadenopathy:     Head:     Right side of head: No submental, submandibular, tonsillar, preauricular, posterior auricular or occipital adenopathy.     Left side of head: No submental, submandibular, tonsillar, preauricular, posterior auricular or occipital adenopathy.     Cervical: No cervical adenopathy.  Skin:    General: Skin is warm and dry.  Neurological:     Mental Status: He is alert.  Psychiatric:        Speech: Speech normal.        Behavior: Behavior normal.

## 2023-10-30 ENCOUNTER — Telehealth: Payer: Self-pay

## 2023-10-30 ENCOUNTER — Other Ambulatory Visit: Payer: Self-pay

## 2023-10-30 DIAGNOSIS — R972 Elevated prostate specific antigen [PSA]: Secondary | ICD-10-CM

## 2023-10-30 NOTE — Assessment & Plan Note (Signed)
Symptomatically stable, continue Crestor 5 mg. Lab Results  Component Value Date   LDLCALC 60 10/29/2023

## 2023-10-30 NOTE — Assessment & Plan Note (Addendum)
Chronic, stable.  Continue febuxostat 80mg  daily. Uric acid 3.2. I have advised patient via result note that a decrease of from febuxostat 80mg  to 40mg  would likely remain adequate.

## 2023-10-30 NOTE — Telephone Encounter (Signed)
Called pt to go over mychart results. Pt stated he would like to review mychart msg first and give the office a call back

## 2023-10-30 NOTE — Progress Notes (Signed)
.  psd

## 2023-10-30 NOTE — Telephone Encounter (Signed)
-----   Message from [name] sent at 10/30/2023  6:37 AM EST ----- Call patient Review result note.  Order PSA and schedule in 1 to 2 weeks.

## 2023-11-05 ENCOUNTER — Other Ambulatory Visit: Payer: Self-pay

## 2023-11-05 DIAGNOSIS — R972 Elevated prostate specific antigen [PSA]: Secondary | ICD-10-CM

## 2023-11-08 ENCOUNTER — Telehealth: Payer: Self-pay | Admitting: Family

## 2023-11-08 ENCOUNTER — Other Ambulatory Visit (INDEPENDENT_AMBULATORY_CARE_PROVIDER_SITE_OTHER): Payer: PPO

## 2023-11-08 DIAGNOSIS — G8929 Other chronic pain: Secondary | ICD-10-CM

## 2023-11-08 DIAGNOSIS — R972 Elevated prostate specific antigen [PSA]: Secondary | ICD-10-CM

## 2023-11-08 NOTE — Telephone Encounter (Signed)
Patient was here today for labs. He would like a referral for physical therapy for rt shoulder. Please send referral to [facility].

## 2023-11-09 LAB — PSA: PSA: 0.05 ng/mL — ABNORMAL LOW (ref 0.10–4.00)

## 2023-11-11 ENCOUNTER — Encounter: Payer: Self-pay | Admitting: Family

## 2023-11-12 ENCOUNTER — Encounter: Payer: Self-pay | Admitting: Family

## 2023-11-12 NOTE — Telephone Encounter (Signed)
Spoke to pt and he is aware and will call if he has not heard back regarding appt

## 2023-11-12 NOTE — Addendum Note (Signed)
Addended by: Allegra Grana on: 11/12/2023 02:18 PM   Modules accepted: Orders

## 2023-11-12 NOTE — Telephone Encounter (Signed)
Call pt Referral to [facility] has been placed Let us know if you dont hear back within a week in regards to an appointment being scheduled.

## 2023-11-14 HISTORY — PX: TOOTH EXTRACTION: SUR596

## 2023-11-16 DIAGNOSIS — G8929 Other chronic pain: Secondary | ICD-10-CM | POA: Diagnosis not present

## 2023-11-16 DIAGNOSIS — M25511 Pain in right shoulder: Secondary | ICD-10-CM | POA: Diagnosis not present

## 2023-11-16 DIAGNOSIS — M6281 Muscle weakness (generalized): Secondary | ICD-10-CM | POA: Diagnosis not present

## 2023-12-06 ENCOUNTER — Telehealth: Payer: Self-pay | Admitting: Podiatry

## 2023-12-06 NOTE — Telephone Encounter (Signed)
Pt called and was trying to get in an appt with Dr Al Corpus and it was explained that Dr Al Corpus was out of the office currently. He was wanting to get in before the weekend because he is going skiing. He is has a sore on left big toe.  I returned call to see if  I could get him to come today and it went to voicemail. I did leave a message for pt to call me back on my number.

## 2023-12-06 NOTE — Telephone Encounter (Signed)
Called pt 2 additional times and the 3rd time called pt and he answered and is scheduled to see Dr Logan Bores tomorrow in [place] at [phone]

## 2023-12-07 ENCOUNTER — Ambulatory Visit: Payer: PPO | Admitting: Podiatry

## 2023-12-07 ENCOUNTER — Ambulatory Visit (INDEPENDENT_AMBULATORY_CARE_PROVIDER_SITE_OTHER): Payer: PPO

## 2023-12-07 ENCOUNTER — Encounter: Payer: Self-pay | Admitting: Podiatry

## 2023-12-07 DIAGNOSIS — L97522 Non-pressure chronic ulcer of other part of left foot with fat layer exposed: Secondary | ICD-10-CM | POA: Diagnosis not present

## 2023-12-07 DIAGNOSIS — M2032 Hallux varus (acquired), left foot: Secondary | ICD-10-CM

## 2023-12-07 MED ORDER — GENTAMICIN SULFATE 0.1 % EX CREA: 1.0000 | TOPICAL_CREAM | Freq: Two times a day (BID) | CUTANEOUS | 1 refills | Status: AC

## 2023-12-07 MED ORDER — DOXYCYCLINE HYCLATE 100 MG PO TABS: 100.0000 mg | ORAL_TABLET | Freq: Two times a day (BID) | ORAL | 0 refills | Status: DC

## 2023-12-07 NOTE — Progress Notes (Signed)
Chief Complaint  Patient presents with   Wound Check    "I have two sores that I can't heal."    HPI: 83 y.o. male presenting today for evaluation of ulcers to the left great toe.  Patient states that he has had 1 ulcer to the more dorsal aspect of the left great toe for about over 1 year now.  He applies a silicone toe sleeve over the toe  According to the patient he developed a second ulcer about 6 weeks ago to the medial aspect of the toe more distal.  Idiopathic onset.  Denies any change in shoe gear or activity that would have elicited or created the wound.  He has been simply wearing the silicone toe sleeve  Past Medical History:  Diagnosis Date   Allergic rhinitis    Bilateral hydrocele    DDD (degenerative disc disease), lumbar     L3-L4   ED (erectile dysfunction) of organic origin    Gout    Hemorrhoids    History of prostatitis    Lumbar herniated disc 2007   L4 and L5 did not require surgery   Prostate cancer (HCC)    S/P  RADIOACTIVE SEED IMPLANTS 05-09-2013    Past Surgical History:  Procedure Laterality Date   APPENDECTOMY  FEB 2000   CATARACT EXTRACTION W/ INTRAOCULAR LENS  IMPLANT, BILATERAL     GASTROCNEMIUS RECESSION Left 06/09/2021   Procedure: Left gastroc recession;  Surgeon: Toni Arthurs, MD;  Location: [facility];  Service: Orthopedics;  Laterality: Left;   HYDROCELE EXCISION Bilateral 01/02/2014   Procedure: BILATERAL HYDROCELECTOMY;  Surgeon: Garnett Farm, MD;  Location: [facility];  Service: Urology;  Laterality: Bilateral;   HYDROCELE EXCISION Left 04/17/2014   Procedure: LEFT HYDROCELECTOMY ADULT;  Surgeon: Garnett Farm, MD;  Location: [facility];  Service: Urology;  Laterality: Left;   KNEE ARTHROSCOPY Left    PLANTAR FASCIA RELEASE Left 06/09/2021   Procedure: Plantar fascia release;  Surgeon: Toni Arthurs, MD;  Location: [facility];  Service: Orthopedics;  Laterality: Left;    RADIOACTIVE SEED IMPLANT N/A 05/09/2013   Procedure: RADIOACTIVE SEED IMPLANT;  Surgeon: Garnett Farm, MD;  Location: [facility];  Service: Urology;  Laterality: N/A;    Allergies  Allergen Reactions   Celebrex [Celecoxib] Rash   Oxymetazoline Hcl Rash    LT great toe 12/07/2023  Physical Exam: General: The patient is alert and oriented x3 in no acute distress.  Dermatology: Skin is warm, dry and supple bilateral lower extremities.  2 separate small ulcers noted to the left great toe.  1 is more dorsal overlying the IPJ and the other is more distal medial.  No drainage noted today.  There is some slight erythema around the toe.  Granular wound base with some mixed fibrotic tissue to the more medial ulcer.  No exposed bone muscle tendon ligament or joint.  No malodor  Vascular: Palpable pedal pulses bilaterally. Capillary refill within normal limits.  No appreciable edema.  Clinically there is no concern for ischemia or vascular compromise  Neurological: Grossly intact via light touch  Musculoskeletal Exam: Hammertoe contracture of the lesser digits with hallux malleus also noted and contracture of the IPJ.  DG Foot Complete Left 12/07/2023 Normal osseous mineralization.  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  Hallux malleus.  Also noted with contracture of the IPJ.  No erosions or irregularities concerning for underlying  osteomyelitis  Assessment/Plan of Care: 1.  Ulcer left great toe x 2 2.  Hallux malleus left great toe 3.  Cellulitis left great toe 4.  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  X-rays reviewed -Prescription for doxycycline 100 mg 2 times daily #20 -Prescription for gentamicin cream apply daily with a light dressing -Continue silicone toe sleeve -Ultimately discussed the possibility of surgery in the future to correct for the hallux malleus which would likely consist of  IPJ arthrodesis.  For now we need to resolve the wounds -Continue wearing good supportive tennis shoes with a soft flexible fabric toebox that provide plenty of room in the toebox area -Patient is leaving for a ski trip in [place] next week.  Recommend caution to ensure that the ski boots do not irritate the great toe -Return to clinic 3 weeks        Felecia Shelling, DPM [facility]  Dr. Felecia Shelling, DPM    [address] [place], [place] [address]                Office [phone]  Fax [phone]

## 2023-12-21 DIAGNOSIS — M6281 Muscle weakness (generalized): Secondary | ICD-10-CM | POA: Diagnosis not present

## 2023-12-21 DIAGNOSIS — M25511 Pain in right shoulder: Secondary | ICD-10-CM | POA: Diagnosis not present

## 2023-12-21 DIAGNOSIS — G8929 Other chronic pain: Secondary | ICD-10-CM | POA: Diagnosis not present

## 2023-12-28 ENCOUNTER — Encounter: Payer: Self-pay | Admitting: Podiatry

## 2023-12-28 ENCOUNTER — Ambulatory Visit: Payer: PPO | Admitting: Podiatry

## 2023-12-28 DIAGNOSIS — M2032 Hallux varus (acquired), left foot: Secondary | ICD-10-CM

## 2023-12-28 NOTE — Progress Notes (Signed)
Chief Complaint  Patient presents with   Wound Check    "It hurts."    HPI: 83 y.o. male presenting today for follow-up evaluation of an ulcer to the left great toe.  He has noticed some improvement since last visit that he continues to have persistent ulcer to the medial aspect of the left great toe.  He has been applying the antibiotic ointment and a Band-Aid.  No new complaints  Past Medical History:  Diagnosis Date   Allergic rhinitis    Bilateral hydrocele    DDD (degenerative disc disease), lumbar     L3-L4   ED (erectile dysfunction) of organic origin    Gout    Hemorrhoids    History of prostatitis    Lumbar herniated disc 2007   L4 and L5 did not require surgery   Prostate cancer (HCC)    S/P  RADIOACTIVE SEED IMPLANTS 05-09-2013    Past Surgical History:  Procedure Laterality Date   APPENDECTOMY  FEB 2000   CATARACT EXTRACTION W/ INTRAOCULAR LENS  IMPLANT, BILATERAL     GASTROCNEMIUS RECESSION Left 06/09/2021   Procedure: Left gastroc recession;  Surgeon: Toni Arthurs, MD;  Location: [facility];  Service: Orthopedics;  Laterality: Left;   HYDROCELE EXCISION Bilateral 01/02/2014   Procedure: BILATERAL HYDROCELECTOMY;  Surgeon: Garnett Farm, MD;  Location: [facility];  Service: Urology;  Laterality: Bilateral;   HYDROCELE EXCISION Left 04/17/2014   Procedure: LEFT HYDROCELECTOMY ADULT;  Surgeon: Garnett Farm, MD;  Location: [facility];  Service: Urology;  Laterality: Left;   KNEE ARTHROSCOPY Left    PLANTAR FASCIA RELEASE Left 06/09/2021   Procedure: Plantar fascia release;  Surgeon: Toni Arthurs, MD;  Location: [facility];  Service: Orthopedics;  Laterality: Left;   RADIOACTIVE SEED IMPLANT N/A 05/09/2013   Procedure: RADIOACTIVE SEED IMPLANT;  Surgeon: Garnett Farm, MD;  Location: [facility];  Service: Urology;  Laterality: N/A;    Allergies  Allergen Reactions   Celebrex [Celecoxib]  Rash   Oxymetazoline Hcl Rash    LT great toe 12/07/2023  Physical Exam: General: The patient is alert and oriented x3 in no acute distress.  Dermatology: Skin is warm, dry and supple bilateral lower extremities.  The ulcer to the dorsal aspect of the IPJ of the left great toe has healed and resolved completely.  There is also significant improvement of the ulcer to the medial aspect of the left great toe.  The wound now measures approximately 0.5 x 0.5 x 0.1 cm.  Healthy granular wound base with minimal fibrotic debris.  No drainage and it appears very dry and stable.  Overall significant improvement since last visit  Vascular: Palpable pedal pulses bilaterally. Capillary refill within normal limits.  No appreciable edema.  Clinically there is no concern for ischemia or vascular compromise  Neurological: Grossly intact via light touch  Musculoskeletal Exam: Hammertoe contracture of the lesser digits with hallux malleus also noted and contracture of the IPJ.  DG Foot Complete Left 12/07/2023 Normal osseous mineralization.  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  Hallux malleus.  Also noted with contracture of the IPJ.  No erosions or irregularities concerning for underlying osteomyelitis  Assessment/Plan of Care: 1.  Ulcer left great toe 2.  Hallux malleus left great toe 3.  Cellulitis left great toe; resolved 4.  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   - Continue  gentamicin cream apply daily with a light dressing -Continue silicone toe sleeve -Continue wearing good supportive tennis shoes with a soft flexible fabric toebox that provide plenty of room in the toebox area -Return to clinic 3 weeks     Felecia Shelling, DPM [facility]  Dr. Felecia Shelling, DPM    [address] [place], [place] [address]                Office [phone]  Fax [phone]

## 2024-01-17 ENCOUNTER — Other Ambulatory Visit: Payer: Self-pay | Admitting: Family

## 2024-01-17 DIAGNOSIS — G629 Polyneuropathy, unspecified: Secondary | ICD-10-CM

## 2024-01-21 DIAGNOSIS — M6281 Muscle weakness (generalized): Secondary | ICD-10-CM | POA: Diagnosis not present

## 2024-01-21 DIAGNOSIS — M25511 Pain in right shoulder: Secondary | ICD-10-CM | POA: Diagnosis not present

## 2024-01-21 DIAGNOSIS — G8929 Other chronic pain: Secondary | ICD-10-CM | POA: Diagnosis not present

## 2024-01-23 DIAGNOSIS — H5203 Hypermetropia, bilateral: Secondary | ICD-10-CM | POA: Diagnosis not present

## 2024-01-23 DIAGNOSIS — H353132 Nonexudative age-related macular degeneration, bilateral, intermediate dry stage: Secondary | ICD-10-CM | POA: Diagnosis not present

## 2024-01-23 DIAGNOSIS — H26493 Other secondary cataract, bilateral: Secondary | ICD-10-CM | POA: Diagnosis not present

## 2024-01-23 DIAGNOSIS — H40013 Open angle with borderline findings, low risk, bilateral: Secondary | ICD-10-CM | POA: Diagnosis not present

## 2024-02-01 ENCOUNTER — Ambulatory Visit: Payer: PPO | Admitting: Podiatry

## 2024-02-01 ENCOUNTER — Encounter: Payer: Self-pay | Admitting: Podiatry

## 2024-02-01 DIAGNOSIS — L97522 Non-pressure chronic ulcer of other part of left foot with fat layer exposed: Secondary | ICD-10-CM | POA: Diagnosis not present

## 2024-02-01 DIAGNOSIS — M2032 Hallux varus (acquired), left foot: Secondary | ICD-10-CM

## 2024-02-01 NOTE — Progress Notes (Signed)
 Chief Complaint  Patient presents with   Wound Check    "It was healing up but I did something to it."    HPI: 83 y.o. male presenting today for follow-up evaluation of an ulcer to the left great toe.  There continues to be improvement.  He is also concerned for the contracture of the great toe.  Past Medical History:  Diagnosis Date   Allergic rhinitis    Bilateral hydrocele    DDD (degenerative disc disease), lumbar     L3-L4   ED (erectile dysfunction) of organic origin    Gout    Hemorrhoids    History of prostatitis    Lumbar herniated disc 2007   L4 and L5 did not require surgery   Prostate cancer (HCC)    S/P  RADIOACTIVE SEED IMPLANTS 05-09-2013    Past Surgical History:  Procedure Laterality Date   APPENDECTOMY  FEB 2000   CATARACT EXTRACTION W/ INTRAOCULAR LENS  IMPLANT, BILATERAL     GASTROCNEMIUS RECESSION Left 06/09/2021   Procedure: Left gastroc recession;  Surgeon: Toni Arthurs, MD;  Location: [facility];  Service: Orthopedics;  Laterality: Left;   HYDROCELE EXCISION Bilateral 01/02/2014   Procedure: BILATERAL HYDROCELECTOMY;  Surgeon: Garnett Farm, MD;  Location[facility];  Service: Urology;  Laterality: Bilateral;   HYDROCELE EXCISION Left 04/17/2014   Procedure: LEFT HYDROCELECTOMY ADULT;  Surgeon: Garnett Farm, MD;  Location: [facility];  Service: Urology;  Laterality: Left;   KNEE ARTHROSCOPY Left    PLANTAR FASCIA RELEASE Left 06/09/2021   Procedure: Plantar fascia release;  Surgeon: Toni Arthurs, MD;  [facility];  Service: Orthopedics;  Laterality: Left;   RADIOACTIVE SEED IMPLANT N/A 05/09/2013   Procedure: RADIOACTIVE SEED IMPLANT;  Surgeon: Garnett Farm, MD;  Location: [facility];  Service: Urology;  Laterality: N/A;    Allergies  Allergen Reactions   Celebrex [Celecoxib] Rash   Oxymetazoline Hcl Rash    LT great toe 12/07/2023   LT great toe  02/01/2024  Physical Exam: General: The patient is alert and oriented x3 in no acute distress.  Dermatology: Continued daily improvement.  Healthy granular tissue noted at the wound base.  The dorsal wound overlying the IPJ is healed completely.  Ulcer to the medial aspect of the great toe measures approximately 0.3 x 0.3 x 0.1 cm with good potential for healing.  Granular wound base.  No exposed bone muscle tendon ligament or joint  Vascular: Palpable pedal pulses bilaterally. Capillary refill within normal limits.  No appreciable edema.  Clinically there is no concern for ischemia or vascular compromise  Neurological: Grossly intact via light touch  Musculoskeletal Exam: Hammertoe contracture of the lesser digits with hallux malleus also noted and contracture of the IPJ.  DG Foot Complete Left 12/07/2023 Normal osseous mineralization.  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  Hallux malleus.  Also noted with contracture of the IPJ.  No erosions or irregularities concerning for underlying osteomyelitis  Assessment/Plan of Care: 1.  Ulcer left great toe 2.  Hallux malleus left great toe 3.  Cellulitis left great toe; resolved 4.  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   - Continue gentamicin cream apply daily with a light dressing -Continue silicone toe sleeve -Continue wearing good supportive tennis shoes with a soft flexible fabric toebox that provide plenty of room in the toebox area -Medically necessary  excisional debridement including subcutaneous tissue was performed using a tissue nipper.  Excisional debridement of the necrotic nonviable tissue down to healthier bleeding viable tissue was performed with postdebridement measurement same as pre- -In regards to the hallux malleus, we did discuss surgery which would include IPJ arthrodesis with Jones tenosuspension.  The procedure was explained as well as the  postoperative recovery course.  For the moment the patient is able to manage okay and overall it is not drastically affecting his daily quality of life. - I do believe that the wound should heal uneventfully.  Return to clinic as needed.  If he notices any change or deterioration of the wound to come in immediately for reevaluation     Felecia Shelling, DPM [facility]  Dr. Felecia Shelling, DPM    [address] [place], [place] [address]                Office [phone]  Fax [phone]

## 2024-04-01 ENCOUNTER — Other Ambulatory Visit: Payer: Self-pay | Admitting: Family

## 2024-04-29 ENCOUNTER — Other Ambulatory Visit: Payer: Self-pay | Admitting: Family

## 2024-04-29 DIAGNOSIS — M1A9XX0 Chronic gout, unspecified, without tophus (tophi): Secondary | ICD-10-CM

## 2024-07-28 ENCOUNTER — Telehealth: Payer: Self-pay

## 2024-07-28 NOTE — Telephone Encounter (Signed)
 Copied from CRM [id number]. Topic: Clinical - Medical Advice >> Jul 28, 2024  3:28 PM Anairis L wrote: Reason for CRM: Patient is scheduled for 07/30/2024 at 2pm for labs. App 08/05/2024. Will that be enough time for labs. Thanks

## 2024-07-28 NOTE — Telephone Encounter (Signed)
 Noted yes it will

## 2024-07-30 ENCOUNTER — Other Ambulatory Visit (INDEPENDENT_AMBULATORY_CARE_PROVIDER_SITE_OTHER)

## 2024-07-30 DIAGNOSIS — R972 Elevated prostate specific antigen [PSA]: Secondary | ICD-10-CM | POA: Diagnosis not present

## 2024-07-30 DIAGNOSIS — H5203 Hypermetropia, bilateral: Secondary | ICD-10-CM | POA: Diagnosis not present

## 2024-07-30 DIAGNOSIS — H26493 Other secondary cataract, bilateral: Secondary | ICD-10-CM | POA: Diagnosis not present

## 2024-07-30 DIAGNOSIS — H353132 Nonexudative age-related macular degeneration, bilateral, intermediate dry stage: Secondary | ICD-10-CM | POA: Diagnosis not present

## 2024-07-30 DIAGNOSIS — H40013 Open angle with borderline findings, low risk, bilateral: Secondary | ICD-10-CM | POA: Diagnosis not present

## 2024-08-01 ENCOUNTER — Telehealth: Payer: Self-pay

## 2024-08-01 DIAGNOSIS — Z0000 Encounter for general adult medical examination without abnormal findings: Secondary | ICD-10-CM

## 2024-08-01 NOTE — Telephone Encounter (Signed)
 Noted

## 2024-08-01 NOTE — Telephone Encounter (Signed)
 I called patient because he had two appointments scheduled for his physical with Rollene Northern, FNP, and I needed to know which one he would like to keep.  Patient states he would like to keep the appointment on 08/05/2024 and delete the one on 10/30/2024.  Patient states he would like to have the following labs drawn prior to his visit so they can discuss the results at his appointment:  PSA, lipid panel, and A1c.  I let patient know that we usually schedule labs two days prior the appointment in order to have the results back in time for the visit, but some of the results may come back sooner.  I let patient know that I will send a message to Margaret's nurse to let them know about his request.

## 2024-08-04 NOTE — Addendum Note (Signed)
 Addended by: Naudia Crosley on: 08/04/2024 08:52 AM   Modules accepted: Orders

## 2024-08-04 NOTE — Telephone Encounter (Signed)
 LVM to call back to office pt has appot on 08/05/24 he can get labs done then or reschedule appt to get labs done before appt

## 2024-08-05 ENCOUNTER — Encounter: Payer: Self-pay | Admitting: Family

## 2024-08-05 ENCOUNTER — Ambulatory Visit (INDEPENDENT_AMBULATORY_CARE_PROVIDER_SITE_OTHER): Admitting: Family

## 2024-08-05 VITALS — BP 128/78 | HR 63 | Temp 97.6°F | Ht 77.0 in | Wt 266.6 lb

## 2024-08-05 DIAGNOSIS — S91109S Unspecified open wound of unspecified toe(s) without damage to nail, sequela: Secondary | ICD-10-CM | POA: Insufficient documentation

## 2024-08-05 DIAGNOSIS — Z0000 Encounter for general adult medical examination without abnormal findings: Secondary | ICD-10-CM | POA: Diagnosis not present

## 2024-08-05 DIAGNOSIS — Z125 Encounter for screening for malignant neoplasm of prostate: Secondary | ICD-10-CM

## 2024-08-05 DIAGNOSIS — G629 Polyneuropathy, unspecified: Secondary | ICD-10-CM | POA: Diagnosis not present

## 2024-08-05 DIAGNOSIS — S91109D Unspecified open wound of unspecified toe(s) without damage to nail, subsequent encounter: Secondary | ICD-10-CM | POA: Diagnosis not present

## 2024-08-05 DIAGNOSIS — E669 Obesity, unspecified: Secondary | ICD-10-CM | POA: Insufficient documentation

## 2024-08-05 DIAGNOSIS — G6289 Other specified polyneuropathies: Secondary | ICD-10-CM | POA: Diagnosis not present

## 2024-08-05 LAB — HEMOGLOBIN A1C: Hgb A1c MFr Bld: 5.8 % (ref 4.6–6.5)

## 2024-08-05 LAB — COMPREHENSIVE METABOLIC PANEL WITH GFR
ALT: 29 U/L (ref 0–53)
AST: 28 U/L (ref 0–37)
Albumin: 4.6 g/dL (ref 3.5–5.2)
Alkaline Phosphatase: 65 U/L (ref 39–117)
BUN: 19 mg/dL (ref 6–23)
CO2: 30 meq/L (ref 19–32)
Calcium: 9.4 mg/dL (ref 8.4–10.5)
Chloride: 104 meq/L (ref 96–112)
Creatinine, Ser: 1.03 mg/dL (ref 0.40–1.50)
GFR: 68.26 mL/min (ref >60.00)
Glucose, Bld: 77 mg/dL (ref 70–99)
Potassium: 4.3 meq/L (ref 3.5–5.1)
Sodium: 141 meq/L (ref 135–145)
Total Bilirubin: 0.8 mg/dL (ref 0.2–1.2)
Total Protein: 6.9 g/dL (ref 6.0–8.3)

## 2024-08-05 LAB — LIPID PANEL
Cholesterol: 133 mg/dL (ref 0–200)
HDL: 35.8 mg/dL — ABNORMAL LOW (ref >39.00)
LDL Cholesterol: 62 mg/dL (ref 0–99)
NonHDL: 96.78
Total CHOL/HDL Ratio: 4
Triglycerides: 175 mg/dL — ABNORMAL HIGH (ref 0.0–149.0)
VLDL: 35 mg/dL (ref 0.0–40.0)

## 2024-08-05 LAB — PSA: PSA: 0.06 ng/mL — ABNORMAL LOW (ref 0.10–4.00)

## 2024-08-05 LAB — TSH: TSH: 2.81 u[IU]/mL (ref 0.35–5.50)

## 2024-08-05 MED ORDER — GABAPENTIN 300 MG PO CAPS
ORAL_CAPSULE | ORAL | 3 refills | Status: AC
Start: 2024-08-05 — End: ?

## 2024-08-05 NOTE — Patient Instructions (Addendum)
 Please continue follow up with podiatry as discussed  Referral nutrition referral  Let us  know if you dont hear back within 2 weeks in regards to an appointment being scheduled.   So that you are aware, if you are [facility] MyChart user , please pay attention to your MyChart messages as you may receive a MyChart message with a phone number to call and schedule this test/appointment own your own from our referral coordinator. This is a new process so I do not want you to miss this message.  If you are not a MyChart user, you will receive a phone call.    Nice to see you.

## 2024-08-05 NOTE — Assessment & Plan Note (Signed)
 Discussed creating a caloric deficit while maintaining enough calories to sustain his vigorous workouts.  Advised to track calories  using My Fitness Pal and reduce daily intake by 200 calories. Switch to sugar-free Gatorade. Refer to a nutritionist for dietary counseling.

## 2024-08-05 NOTE — Assessment & Plan Note (Signed)
 Overall well controlled.  Continue amitriptyline  50 mg, gabapentin  600mg  qam and 1200mg  qpm,  prn tylenol  or advil for breakthrough pain

## 2024-08-05 NOTE — Assessment & Plan Note (Signed)
 The chronic wound is slow to heal due to water  exposure.  Signs infection or PAD. Strong pulses confirm good circulation.  Advised continue using gentamicin  cream as prescribed by podiatry with a light dressing. Protect the area with a liquid bandage and rubber sleeve.

## 2024-08-05 NOTE — Progress Notes (Signed)
 Assessment & Plan:  Neuropathy -     Gabapentin ; TAKE 2 CAPSULES BY MOUTH EVERY MORNING AND TAKE FOUR CAPSULES BY MOUTH EVERY NIGHT AT BEDTIME  Dispense: 540 capsule; Refill: 3 -     TSH  Obesity (BMI 30-39.9) Assessment & Plan:   Discussed creating a caloric deficit while maintaining enough calories to sustain his vigorous workouts.  Advised to track calories  using My Fitness Pal and reduce daily intake by 200 calories. Switch to sugar-free Gatorade. Refer to a nutritionist for dietary counseling.  Orders: -     Amb ref to Medical Nutrition Therapy-MNT  Encounter for routine adult health examination without abnormal findings -     PSA -     Lipid panel -     Comprehensive metabolic panel with GFR -     Hemoglobin A1c  Open toe wound, sequela Assessment & Plan: The chronic wound is slow to heal due to water  exposure.  Signs infection or PAD. Strong pulses confirm good circulation.  Advised continue using gentamicin  cream as prescribed by podiatry with a light dressing. Protect the area with a liquid bandage and rubber sleeve.    Other polyneuropathy Assessment & Plan: Overall well controlled.  Continue amitriptyline  50 mg, gabapentin  600mg  qam and 1200mg  qpm,  prn tylenol  or advil for breakthrough pain      Return precautions given.   Risks, benefits, and alternatives of the medications and treatment plan prescribed today were discussed, and patient expressed understanding.   Education regarding symptom management and diagnosis given to patient on AVS either electronically or printed.  No follow-ups on file.  Joshua Northern, FNP  Subjective:    Patient ID: Joshua Dominguez, male    DOB: Apr 03, 1941, 83 y.o.   MRN: [id number]  CC: [name] is a 83 y.o. male who presents today for follow up.   HPI: HPI Discussed the use of AI scribe software for clinical note transcription with the patient, who gave verbal consent to proceed.  History of Present Illness    Joshua Dominguez is an 83 year old male who presents for a follow-up visit and lab work.  He is concerned about weight management despite a rigorous exercise routine. He exercises six days a week, including TRX, weightlifting, swimming, biking, and running. His diet consists of two meals a day, including fruits, hummus, cheese, yogurt, and a regular dinner prepared by his wife. Despite this, he has difficulty controlling his weight and is considering tracking his caloric intake to better understand his dietary needs.  He has a lesion on his left great toe that has been present for two and a half months, improved over all. The lesion has been difficult to heal due to frequent water  exposure from swimming. He has been using gentamicin  cream prescribed by podiatry and liquid bandage to manage the lesion. He previously saw a podiatrist who performed debridement and prescribed doxycyline, which he completed. The patient reports that the lesion was smaller three weeks ago, but after swimming without protection, it became larger again.   Compliant with gabapentin  600 mg every morning, 1200 mg at bedtime   CPE 10/29/23 Follow up podiatry 02/01/24  gentamicin  cream apply daily   Allergies: Celebrex [celecoxib] and Oxymetazoline hcl Current Outpatient Medications on File Prior to Visit  Medication Sig Dispense Refill   amitriptyline  (ELAVIL ) 50 MG tablet TAKE 1 TABLET BY MOUTH EVERY NIGHT AT BEDTIME 90 tablet 3   Febuxostat  80 MG TABS TAKE 1 TABLET  BY MOUTH DAILY 90 tablet 3   gentamicin  cream (GARAMYCIN ) 0.1 % Apply 1 Application topically 2 (two) times daily. 30 g 1   glucosamine-chondroitin 500-400 MG tablet Take 1 tablet by mouth 2 (two) times daily.     hydrocortisone  2.5 % lotion once or twice daily to affected areas on face up to 1 week as needed. 60 mL 2   metroNIDAZOLE  (METROGEL ) 1 % gel Apply topically at bedtime. Qhs to face for Rosacea 45 g 11   Multiple Vitamin (MULTIVITAMIN) tablet  Take 1 tablet by mouth daily.     Multiple Vitamins-Minerals (PRESERVISION AREDS 2+MULTI VIT PO) Take by mouth daily.     Omega-3 Fatty Acids (FISH OIL) 1200 MG CAPS Take 1 capsule by mouth 2 (two) times daily.     rosuvastatin  (CRESTOR ) 5 MG tablet TAKE 1 TABLET BY MOUTH DAILY 90 tablet 3   No current facility-administered medications on file prior to visit.    Review of Systems  Constitutional:  Negative for chills and fever.  HENT:  Negative for congestion, ear pain, rhinorrhea, sinus pressure and sore throat.   Respiratory:  Negative for cough, shortness of breath and wheezing.   Cardiovascular:  Negative for chest pain and palpitations.  Gastrointestinal:  Negative for diarrhea, nausea and vomiting.  Genitourinary:  Negative for dysuria.  Musculoskeletal:  Negative for myalgias.  Skin:  Negative for rash.  Neurological:  Negative for headaches.  Hematological:  Negative for adenopathy.      Objective:    BP 128/78   Pulse 63   Temp 97.6 F (36.4 C) (Oral)   Ht 6' 5 (1.956 m)   Wt 266 lb 9.6 oz (120.9 kg)   SpO2 95%   BMI 31.61 kg/m  BP Readings from Last 3 Encounters:  08/05/24 128/78  10/29/23 132/80  08/29/23 130/86   Wt Readings from Last 3 Encounters:  08/05/24 266 lb 9.6 oz (120.9 kg)  10/29/23 262 lb 9.6 oz (119.1 kg)  08/29/23 254 lb 6 oz (115.4 kg)     Physical Exam Vitals reviewed.  Constitutional:      Appearance: He is well-developed.  Neck:     Thyroid : No thyroid  mass or thyromegaly.  Cardiovascular:     Rate and Rhythm: Regular rhythm.     Heart sounds: Normal heart sounds.     Comments: No LE edema, palpable cords or masses. No erythema or increased warmth. No asymmetry in calf size when compared bilaterally LE hair growth symmetric and present. No discoloration or varicosities noted. LE warm and palpable pedal pulses.  Pulmonary:     Effort: Pulmonary effort is normal. No respiratory distress.     Breath sounds: Normal breath sounds.  No wheezing, rhonchi or rales.  Musculoskeletal:     Right lower leg: No edema.     Left lower leg: No edema.       Feet:  Feet:     Comments: Pinpoint left great toe lesion. No erythema, red streaks, purulent discharge.   Lymphadenopathy:     Head:     Right side of head: No submental, submandibular, tonsillar, preauricular, posterior auricular or occipital adenopathy.     Left side of head: No submental, submandibular, tonsillar, preauricular, posterior auricular or occipital adenopathy.     Cervical: No cervical adenopathy.  Skin:    General: Skin is warm and dry.  Neurological:     Mental Status: He is alert.  Psychiatric:        Speech: Speech normal.  Behavior: Behavior normal.

## 2024-08-07 ENCOUNTER — Ambulatory Visit: Payer: Self-pay | Admitting: Family

## 2024-09-03 ENCOUNTER — Ambulatory Visit: Payer: PPO | Admitting: *Deleted

## 2024-09-03 VITALS — Ht 77.0 in | Wt 250.0 lb

## 2024-09-03 DIAGNOSIS — Z0000 Encounter for general adult medical examination without abnormal findings: Secondary | ICD-10-CM | POA: Diagnosis not present

## 2024-09-03 NOTE — Progress Notes (Signed)
 Subjective:   Joshua Dominguez is a 83 y.o. who presents for a Medicare Wellness preventive visit.  As a reminder, Annual Wellness Visits don't include a physical exam, and some assessments may be limited, especially if this visit is performed virtually. We may recommend an in-person follow-up visit with your provider if needed.  Visit Complete: Virtual I connected with  Joshua Dominguez on 09/03/24 by a audio enabled telemedicine application and verified that I am speaking with the correct person using two identifiers.  Patient Location: Home  Provider Location: Home Office  I discussed the limitations of evaluation and management by telemedicine. The patient expressed understanding and agreed to proceed.  Vital Signs: Because this visit was a virtual/telehealth visit, some criteria may be missing or patient reported. Any vitals not documented were not able to be obtained and vitals that have been documented are patient reported.  VideoError- Librarian, academic were attempted between this provider and patient, however failed, due to patient having technical difficulties OR patient did not have access to video capability.  We continued and completed visit with audio only.   Persons Participating in Visit: Patient.  AWV Questionnaire: No: Patient Medicare AWV questionnaire was not completed prior to this visit.  Cardiac Risk Factors include: advanced age (>78men, >45 women);male gender;dyslipidemia     Objective:    Today's Vitals   09/03/24 1313  Weight: 250 lb (113.4 kg)  Height: 6' 5 (1.956 m)   Body mass index is 29.65 kg/m.     09/03/2024    1:26 PM 08/29/2023   10:12 AM 08/16/2022    1:08 PM 06/09/2021    6:58 AM 05/31/2021   11:48 AM 02/20/2019    9:35 AM 04/17/2014    7:45 AM  Advanced Directives  Does Patient Have a Medical Advance Directive? Yes Yes Yes No No Yes Patient has advance directive, copy not in chart   Type of Advance  Directive Healthcare Power of [place];Living will Healthcare Power of [place];Living will Healthcare Power of [place];Living will    Healthcare Power of [place];Living will   Does patient want to make changes to medical advance directive?   No - Patient declined   No - Patient declined  No change requested   Copy of Healthcare Power of Attorney in Chart? No - copy requested No - copy requested No - copy requested    Copy requested from family   Would patient like information on creating a medical advance directive?    No - Patient declined  No - Patient declined       Data saved with a previous flowsheet row definition    Current Medications (verified) Outpatient Encounter Medications as of 09/03/2024  Medication Sig   amitriptyline  (ELAVIL ) 50 MG tablet TAKE 1 TABLET BY MOUTH EVERY NIGHT AT BEDTIME   Febuxostat  80 MG TABS TAKE 1 TABLET BY MOUTH DAILY   gabapentin  (NEURONTIN ) 300 MG capsule TAKE 2 CAPSULES BY MOUTH EVERY MORNING AND TAKE FOUR CAPSULES BY MOUTH EVERY NIGHT AT BEDTIME   glucosamine-chondroitin 500-400 MG tablet Take 1 tablet by mouth 2 (two) times daily.   hydrocortisone  2.5 % lotion once or twice daily to affected areas on face up to 1 week as needed.   metroNIDAZOLE  (METROGEL ) 1 % gel Apply topically at bedtime. Qhs to face for Rosacea   Multiple Vitamin (MULTIVITAMIN) tablet Take 1 tablet by mouth daily.   Multiple Vitamins-Minerals (PRESERVISION AREDS 2+MULTI VIT PO) Take by mouth daily.  Omega-3 Fatty Acids (FISH OIL) 1200 MG CAPS Take 1 capsule by mouth 2 (two) times daily.   rosuvastatin  (CRESTOR ) 5 MG tablet TAKE 1 TABLET BY MOUTH DAILY   gentamicin  cream (GARAMYCIN ) 0.1 % Apply 1 Application topically 2 (two) times daily. (Patient not taking: Reported on 09/03/2024)   No facility-administered encounter medications on file as of 09/03/2024.    Allergies (verified) Celebrex [celecoxib] and Oxymetazoline hcl   History: Past Medical History:  Diagnosis Date    Allergic rhinitis    Bilateral hydrocele    DDD (degenerative disc disease), lumbar     L3-L4   ED (erectile dysfunction) of organic origin    Gout    Hemorrhoids    History of prostatitis    Lumbar herniated disc 2007   L4 and L5 did not require surgery   Prostate cancer (HCC)    S/P  RADIOACTIVE SEED IMPLANTS 05-09-2013   Past Surgical History:  Procedure Laterality Date   APPENDECTOMY  12/14/1998   CATARACT EXTRACTION W/ INTRAOCULAR LENS  IMPLANT, BILATERAL     GASTROCNEMIUS RECESSION Left 06/09/2021   Procedure: Left gastroc recession;  Surgeon: Kit Rush, MD;  Location: [facility];  Service: Orthopedics;  Laterality: Left;   HYDROCELE EXCISION Bilateral 01/02/2014   Procedure: BILATERAL HYDROCELECTOMY;  Surgeon: Oneil JAYSON Rafter, MD;  Location: [facility];  Service: Urology;  Laterality: Bilateral;   HYDROCELE EXCISION Left 04/17/2014   Procedure: LEFT HYDROCELECTOMY ADULT;  Surgeon: Oneil JAYSON Rafter, MD;  Location: [facility];  Service: Urology;  Laterality: Left;   KNEE ARTHROSCOPY Left    PLANTAR FASCIA RELEASE Left 06/09/2021   Procedure: Plantar fascia release;  Surgeon: Kit Rush, MD;  Location: [facility];  Service: Orthopedics;  Laterality: Left;   RADIOACTIVE SEED IMPLANT N/A 05/09/2013   Procedure: RADIOACTIVE SEED IMPLANT;  Surgeon: Oneil JAYSON Rafter, MD;  Location: [facility];  Service: Urology;  Laterality: N/A;   TOOTH EXTRACTION  2025   Family History  Problem Relation Age of Onset   Hypertension Father    Gout Father    Prostate cancer Father    Lung cancer Mother        +smoker   Lung cancer Maternal Grandfather        -smoker   Heart disease Sister    Heart disease Brother    Social History   Socioeconomic History   Marital status: Married    Spouse name: Not on file   Number of children: Not on file   Years of education: Not on file   Highest education level: Doctorate   Occupational History   Occupation: Consulting/professor  Tobacco Use   Smoking status: Former    Types: Pipe    Quit date: 11/13/1976    Years since quitting: 47.8   Smokeless tobacco: Never  Vaping Use   Vaping status: Never Used  Substance and Sexual Activity   Alcohol use: Yes    Alcohol/week: 14.0 standard drinks of alcohol    Types: 14 Glasses of wine per week    Comment: glass of red wine with dinner   Drug use: No   Sexual activity: Not Currently  Other Topics Concern   Not on file  Social History Narrative   Married   MWF TRX and then 1 hour of weight; Tiues/Thurs are triathlon days ( swim, bike, run).      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, statistics, occupational behavior      [place], active lifestyle- swims, runs, lifts [employer]   Social Drivers of Health   Financial Resource Strain: Low Risk  (09/03/2024)   Overall Financial Resource Strain (CARDIA)    Difficulty of Paying Living Expenses: Not hard at all  Food Insecurity: No Food Insecurity (09/03/2024)   Hunger Vital Sign    Worried About Running Out of Food in the Last Year: Never true    Ran Out of Food in the Last Year: Never true  Transportation Needs: No Transportation Needs (09/03/2024)   PRAPARE - Administrator, Civil Service (Medical): No    Lack of Transportation (Non-Medical): No  Physical Activity: Sufficiently Active (09/03/2024)   Exercise Vital Sign    Days of Exercise per Week: 6 days    Minutes of Exercise per Session: 90 min  Stress: No Stress Concern Present (09/03/2024)   [employer] of Occupational Health - Occupational Stress Questionnaire    Feeling of Stress: Not at all  Social Connections: Socially Integrated (09/03/2024)   Social Connection and Isolation Panel    Frequency of Communication with Friends and Family: More than three times a  week    Frequency of Social Gatherings with Friends and Family: More than three times a week    Attends Religious Services: More than 4 times per year    Active Member of [employer] or Organizations: Yes    Attends Engineer, structural: More than 4 times per year    Marital Status: Married    Tobacco Counseling Counseling given: Not Answered    Clinical Intake:  Pre-visit preparation completed: Yes  Pain : No/denies pain     BMI - recorded: 29.65 Nutritional Status: BMI 25 -29 Overweight Nutritional Risks: None Diabetes: No  Lab Results  Component Value Date   HGBA1C 5.8 08/05/2024   HGBA1C 5.6 10/29/2023   HGBA1C 5.4 08/26/2021     How often do you need to have someone help you when you read instructions, pamphlets, or other written materials from your doctor or pharmacy?: 1 - Never  Interpreter Needed?: No  Information entered by :: R. Ziza Hastings LPN   Activities of Daily Living     09/03/2024    1:15 PM  In your present state of health, do you have any difficulty performing the following activities:  Hearing? 0  Vision? 0  Difficulty concentrating or making decisions? 0  Walking or climbing stairs? 0  Dressing or bathing? 0  Doing errands, shopping? 0  Preparing Food and eating ? N  Using the Toilet? N  In the past six months, have you accidently leaked urine? N  Do you have problems with loss of bowel control? N  Managing your Medications? N  Managing your Finances? N  Housekeeping or managing your Housekeeping? N    Patient Care Team: Dineen Rollene MATSU, FNP as PCP - General (Family Medicine)  I have updated your Care Teams any recent Medical Services you may have received from other providers in the past year.     Assessment:   This is a routine wellness examination for Alexios.  Hearing/Vision screen Hearing Screening - Comments:: Wears aids Vision Screening - Comments:: No correction   Goals Addressed             This Visit's  Progress    Patient Stated       Wants to continue to  stay like he is now       Depression Screen     09/03/2024    1:22 PM 08/05/2024   11:09 AM 10/29/2023   10:22 AM 08/29/2023   10:07 AM 04/27/2023    9:38 AM 09/08/2022    8:38 AM 09/08/2022    8:37 AM  PHQ 2/9 Scores  PHQ - 2 Score 0 0 0 0 0 0 0  PHQ- 9 Score 0 0  0  0     Fall Risk     09/03/2024    1:17 PM 08/05/2024   11:08 AM 10/29/2023   10:22 AM 08/29/2023   10:00 AM 04/27/2023    9:38 AM  Fall Risk   Falls in the past year? 0 0 0 0 0  Number falls in past yr: 0 0 0 0 0  Injury with Fall? 0 0 0 0 0  Risk for fall due to : No Fall Risks No Fall Risks No Fall Risks No Fall Risks No Fall Risks  Follow up Falls evaluation completed;Falls prevention discussed Falls evaluation completed Falls evaluation completed Falls prevention discussed;Falls evaluation completed Falls evaluation completed    MEDICARE RISK AT HOME:  Medicare Risk at Home Any stairs in or around the home?: Yes If so, are there any without handrails?: No Home free of loose throw rugs in walkways, pet beds, electrical cords, etc?: Yes Adequate lighting in your home to reduce risk of falls?: Yes Life alert?: Yes Use of a cane, walker or w/c?: No Grab bars in the bathroom?: Yes Shower chair or bench in shower?: Yes Elevated toilet seat or a handicapped toilet?: No  TIMED UP AND GO:  Was the test performed?  No  Cognitive Function: 6CIT completed        09/03/2024    1:27 PM 08/29/2023   10:12 AM 08/16/2022    1:25 PM  6CIT Screen  What Year? 0 points 0 points   What month? 0 points 0 points   What time? 0 points 0 points   Count back from 20 0 points 0 points   Months in reverse 0 points 0 points 0 points  Repeat phrase 0 points 0 points   Total Score 0 points 0 points     Immunizations Immunization History  Administered Date(s) Administered    sv, Bivalent, Protein Subunit Rsvpref,pf (Abrysvo) 10/09/2022   Fluad Quad(high Dose  65+) 07/08/2019, 08/05/2020, 08/15/2022   INFLUENZA, HIGH DOSE SEASONAL PF 07/15/2018, 07/30/2023   Influenza Split 08/22/2011, 06/13/2013   Influenza Whole 08/16/2010   Influenza-Unspecified 08/16/2010, 06/13/2013, 07/20/2016, 06/27/2017, 07/15/2018, 08/08/2021, 08/15/2021   Moderna Covid-19 Fall Seasonal Vaccine 81yrs & older 08/15/2022   Moderna Covid-19 Vaccine Bivalent Booster 75yrs & up 08/08/2021   Moderna Sars-Covid-2 Vaccination 12/01/2019, 12/29/2019, 01/16/2020, 09/23/2020, 03/31/2021, 08/08/2021, 08/15/2022   PNEUMOCOCCAL CONJUGATE-20 10/29/2023   Pfizer Covid-19 Vaccine Bivalent Booster 32yrs & up 08/15/2021   Pneumococcal Conjugate-13 12/11/2013   Pneumococcal Polysaccharide-23 09/13/2004, 06/14/2010, 11/27/2011   Pneumococcal-Unspecified 12/11/2013   Td 04/18/2005   Tdap 11/27/2011, 02/01/2016   Zoster Recombinant(Shingrix) 03/28/2018   Zoster, Live 06/05/2011    Screening Tests Health Maintenance  Topic Date Due   Zoster Vaccines- Shingrix (2 of 2) 05/23/2018   COVID-19 Vaccine (8 - 2025-26 season) 07/14/2024   Medicare Annual Wellness (AWV)  08/28/2024   Influenza Vaccine  02/10/2025 (Originally 06/13/2024)   DTaP/Tdap/Td (4 - Td or Tdap) 01/31/2026   Pneumococcal Vaccine: 50+ Years  Completed   Meningococcal B Vaccine  Aged Out   Hepatitis C Screening  Discontinued   Fecal DNA (Cologuard)  Discontinued    Health Maintenance Items Addressed: Patient declines covid vaccines. Patient needs his second shingles vaccine.   Additional Screening:  Vision Screening: Recommended annual ophthalmology exams for early detection of glaucoma and other disorders of the eye. Is the patient up to date with their annual eye exam?  Yes  Who is the provider or what is the name of the office in which the patient attends annual eye exams?  [facility]  Dental Screening: Recommended annual dental exams for proper oral hygiene  Community Resource Referral / Chronic Care  Management: CRR required this visit?  No   CCM required this visit?  No   Plan:    I have personally reviewed and noted the following in the patient's chart:   Medical and social history Use of alcohol, tobacco or illicit drugs  Current medications and supplements including opioid prescriptions. Patient is not currently taking opioid prescriptions. Functional ability and status Nutritional status Physical activity Advanced directives List of other physicians Hospitalizations, surgeries, and ER visits in previous 12 months Vitals Screenings to include cognitive, depression, and falls Referrals and appointments  In addition, I have reviewed and discussed with patient certain preventive protocols, quality metrics, and best practice recommendations. A written personalized care plan for preventive services as well as general preventive health recommendations were provided to patient.   Angeline Fredericks, LPN   [id number]   After Visit Summary: (MyChart) Due to this being a telephonic visit, the after visit summary with patients personalized plan was offered to patient via MyChart   Notes: Nothing significant to report at this time.

## 2024-09-03 NOTE — Patient Instructions (Signed)
 Mr. Arizpe,  Thank you for taking the time for your Medicare Wellness Visit. I appreciate your continued commitment to your health goals. Please review the care plan we discussed, and feel free to reach out if I can assist you further.  Medicare recommends these wellness visits once per year to help you and your care team stay ahead of potential health issues. These visits are designed to focus on prevention, allowing your provider to concentrate on managing your acute and chronic conditions during your regular appointments.  Please note that Annual Wellness Visits do not include a physical exam. Some assessments may be limited, especially if the visit was conducted virtually. If needed, we may recommend a separate in-person follow-up with your provider.  Ongoing Care Seeing your primary care provider every 3 to 6 months helps us  monitor your health and provide consistent, personalized care.  I was unable to find a record of your second shingles (shingrix) vaccine.  Your flu vaccine has been updated.   Referrals If a referral was made during today's visit and you haven't received any updates within two weeks, please contact the referred provider directly to check on the status.  Recommended Screenings:  Health Maintenance  Topic Date Due   Zoster (Shingles) Vaccine (2 of 2) 05/23/2018   COVID-19 Vaccine (8 - 2025-26 season) 07/14/2024   Medicare Annual Wellness Visit  09/03/2025   DTaP/Tdap/Td vaccine (4 - Td or Tdap) 01/31/2026   Pneumococcal Vaccine for age over 101  Completed   Flu Shot  Completed   Meningitis B Vaccine  Aged Out   Hepatitis C Screening  Discontinued   Cologuard (Stool DNA test)  Discontinued       09/03/2024    1:26 PM  Advanced Directives  Does Patient Have a Medical Advance Directive? Yes  Type of Estate agent of [place];Living will  Copy of Healthcare Power of Attorney in Chart? No - copy requested   Advance Care Planning is  important because it: Ensures you receive medical care that aligns with your values, goals, and preferences. Provides guidance to your family and loved ones, reducing the emotional burden of decision-making during critical moments.  Vision: Annual vision screenings are recommended for early detection of glaucoma, cataracts, and diabetic retinopathy. These exams can also reveal signs of chronic conditions such as diabetes and high blood pressure.  Dental: Annual dental screenings help detect early signs of oral cancer, gum disease, and other conditions linked to overall health, including heart disease and diabetes.  Please see the attached documents for additional preventive care recommendations.

## 2024-09-16 ENCOUNTER — Ambulatory Visit: Admitting: Podiatry

## 2024-09-16 ENCOUNTER — Encounter: Payer: Self-pay | Admitting: Podiatry

## 2024-09-16 VITALS — Ht 77.0 in | Wt 250.0 lb

## 2024-09-16 DIAGNOSIS — M2032 Hallux varus (acquired), left foot: Secondary | ICD-10-CM

## 2024-09-16 NOTE — Progress Notes (Signed)
 Chief Complaint  Patient presents with   Toe Pain    Pt is here to discuss surgery to the left great toe to straighten it out.    HPI: 83 y.o. 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  Past Medical History:  Diagnosis Date   Allergic rhinitis    Bilateral hydrocele    DDD (degenerative disc disease), lumbar     L3-L4   ED (erectile dysfunction) of organic origin    Gout    Hemorrhoids    History of prostatitis    Lumbar herniated disc 2007   L4 and L5 did not require surgery   Prostate cancer (HCC)    S/P  RADIOACTIVE SEED IMPLANTS 05-09-2013    Past Surgical History:  Procedure Laterality Date   APPENDECTOMY  12/14/1998   CATARACT EXTRACTION W/ INTRAOCULAR LENS  IMPLANT, BILATERAL     GASTROCNEMIUS RECESSION Left 06/09/2021   Procedure: Left gastroc recession;  Surgeon: Kit Rush, MD;  Location: [facility];  Service: Orthopedics;  Laterality: Left;   HYDROCELE EXCISION Bilateral 01/02/2014   Procedure: BILATERAL HYDROCELECTOMY;  Surgeon: Oneil JAYSON Rafter, MD;  Location: [facility];  Service: Urology;  Laterality: Bilateral;   HYDROCELE EXCISION Left 04/17/2014   Procedure: LEFT HYDROCELECTOMY ADULT;  Surgeon: Oneil JAYSON Rafter, MD;  Location: [facility];  Service: Urology;  Laterality: Left;   KNEE ARTHROSCOPY Left    PLANTAR FASCIA RELEASE Left 06/09/2021   Procedure: Plantar fascia release;  Surgeon: Kit Rush, MD;  Location:  SURGERY CENTER;  Service: Orthopedics;  Laterality: Left;   RADIOACTIVE SEED IMPLANT N/A 05/09/2013   Procedure: RADIOACTIVE SEED IMPLANT;  Surgeon: Oneil JAYSON Rafter, MD;  Location: [facility];  Service: Urology;  Laterality: N/A;   TOOTH EXTRACTION  2025    Allergies  Allergen Reactions   Celebrex [Celecoxib] Rash   Oxymetazoline Hcl Rash    LT great toe 12/07/2023   LT great toe 02/01/2024   LT great toe  09/16/2024  Physical Exam: General: The patient is alert and oriented x3 in no acute distress.  Dermatology: The ulcers noted to the left great toe have healed and resolved completely.  No open wounds.  Clinically no indication of infection.  Vascular: Palpable pedal pulses bilaterally. Capillary refill within normal limits.  No appreciable edema.  Clinically there is no concern for ischemia or vascular compromise  Neurological: Grossly intact via light touch  Musculoskeletal Exam: Hammertoe contracture of the lesser digits with hallux malleus also noted and contracture of the IPJ.  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  Please see above noted photo.  Muscle strength 5/5 all compartments.  DG Foot Complete Left 12/07/2023 Normal osseous mineralization.  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  Hallux malleus.  Also noted with contracture of the IPJ.  No erosions or irregularities concerning for underlying osteomyelitis  Assessment/Plan of Care: 1.  Ulcer left great toe; healed 2.  Hallux malleus left great toe 3.  Cellulitis left great toe; resolved 4.  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   - Today we discussed conservative versus surgical management of the contracture to the left great toe.  Unfortunately conservative treatment management has been unsuccessful.  He has a history of wounds that developed because of the structural deformity of the toe.  I do believe that surgery is warranted at this  time.  Today we discussed surgical correction for the mallet toe deformity which would include a Jones tenosuspension with hallux IPJ arthrodesis.  Risk benefits advantages and disadvantages of the procedure were explained in detail in length to the patient.  No guarantees were expressed or implied.  Postoperative recovery course was also explained.  After discussion with the patient he would like to proceed  with surgery. -Authorization for surgery was initiated today.  Surgery will consist of Jones tenosuspension with hallux IPJ arthrodesis left great toe -Return to clinic 1 week postop   Thresa EMERSON Sar, DPM [facility]  Dr. Thresa EMERSON Sar, DPM    [address] [place], [place] [address]                Office [phone]  Fax [phone]

## 2024-10-16 ENCOUNTER — Ambulatory Visit: Payer: Self-pay

## 2024-10-16 NOTE — Telephone Encounter (Signed)
 LVM to call back to inform of message below per Rollene

## 2024-10-16 NOTE — Telephone Encounter (Signed)
 FYI Only or Action Required?: Action required by provider: request for appointment, clinical question for provider, update on patient condition, and ER Refusal for chest pain that radiates to the back under left shoulderblade.  Patient was last seen in primary care on 08/05/2024 by Dineen Rollene MATSU, FNP.  Called Nurse Triage reporting Chest Pain.  Symptoms began 2 days ago.  Interventions attempted: OTC medications: Advil last night & over the counter cold medicine per patient.  Symptoms are: gradually worsening.  Triage Disposition: Go to ED Now (or PCP Triage)  Patient/caregiver understands and will follow disposition?: No, wishes to speak with PCP                Copied from CRM #[id number]. Topic: Clinical - Red Word Triage >> Oct 16, 2024 12:49 PM Deaijah H wrote: Red Word that prompted transfer to Nurse Triage: Intense Pain high up in chest or back.SABRA Arms it heart involved or lung involved. Extremely painful when taking deep breaths. Reason for Disposition  Taking a deep breath makes pain worse  Answer Assessment - Initial Assessment Questions Patient states he had a cold & has developed an intense pain in chest left sided chest pain and it radiates to the back under the left shoulderblader--Patient states 11 on a scale of 1-10 Patient states if he gets in the wrong position Started 2 days ago Patient states this is on the end of having a cold Has been taking over the counter cold medication Taking a deep breath---stabbing pain Patient denies nausea, vomiting, diarrhea today Patient took Advil last night Patient states that given his age and the symptoms he believes this could possibly be something with his heart or lungs. This RN advised him with the symptoms he is having it is recommended that he goes to the Emergency Room for further evaluation Patient states he does not want to go to the Emergency Room due to the long wait. Patient just wants to see his  provider Patient is again advised that the recommendation is to go to the Emergency Room for further evaluation of this but he states he does not want to go to the ER. He wants his provider to get this message and is advised to call us  back if anything changes or with any other questions/concerns. Patient is also advised if his symptoms worsen to go to call 911. He verbalized understanding.  Protocols used: Chest Pain-A-AH

## 2024-10-16 NOTE — Telephone Encounter (Signed)
 Called [facility] and Saddie advised Rollene Arnett's nurse wasn't available but to send the CRM and this RN advised her that I had just sent it to the clinical staff at the office.

## 2024-10-16 NOTE — Telephone Encounter (Signed)
 Copied from CRM #[id number]. Topic: General - Other >> Oct 16, 2024  1:49 PM Brittany M wrote: Reason for CRM: Patient returning call for Norine Skelton, CMA- he said he is NOT going to the ED

## 2024-10-17 NOTE — Telephone Encounter (Signed)
 Called and LVM 2 times and sent my chart message for pt to call back to inform pt of message below per Rollene  Please urge pt to go to ED for CP   Advise pt if having a heart attack, our office is NOT appropriate.    If he is willing to go to [facility] clinic next to ED, they can at least obtain an EKG however for CP, ED is the only appropriate place   It is not safe to delay care and await for follow up in our office next week

## 2024-10-20 NOTE — Telephone Encounter (Signed)
 Called and LVM 3rd time and sent my chart message for pt to call back to inform pt of message below per Rollene   Please urge pt to go to ED for CP   Advise pt if having a heart attack, our office is NOT appropriate.    If he is willing to go to [facility] clinic next to ED, they can at least obtain an EKG however for CP, ED is the only appropriate place   It is not safe to delay care and await for follow up in our office next week

## 2024-10-21 NOTE — Telephone Encounter (Signed)
 LVM and sent letter to pt to reach out to us  when he receives it

## 2024-10-30 ENCOUNTER — Encounter: Payer: PPO | Admitting: Family

## 2025-08-06 ENCOUNTER — Encounter: Admitting: Family

## 2025-09-07 ENCOUNTER — Ambulatory Visit
